# Patient Record
Sex: Female | Born: 1985 | Race: Black or African American | Hispanic: No | Marital: Married | Smoking: Current every day smoker
Health system: Southern US, Community
[De-identification: ages and names within clinical notes are randomized; demographics above are authoritative.]

## PROBLEM LIST (undated history)

## (undated) DIAGNOSIS — N289 Disorder of kidney and ureter, unspecified: Secondary | ICD-10-CM

## (undated) HISTORY — PX: TUBAL LIGATION: SHX77

## (undated) HISTORY — PX: CHOLECYSTECTOMY: SHX55

## (undated) HISTORY — PX: WISDOM TOOTH EXTRACTION: SHX21

---

## 2010-05-29 ENCOUNTER — Encounter: Payer: Self-pay | Admitting: Emergency Medicine

## 2010-05-29 ENCOUNTER — Inpatient Hospital Stay (HOSPITAL_COMMUNITY): Admission: AD | Admit: 2010-05-29 | Discharge: 2010-05-29 | Payer: Self-pay | Admitting: Obstetrics and Gynecology

## 2010-07-14 ENCOUNTER — Inpatient Hospital Stay (HOSPITAL_COMMUNITY): Admission: AD | Admit: 2010-07-14 | Discharge: 2010-07-14 | Payer: Self-pay | Admitting: Family Medicine

## 2010-07-14 ENCOUNTER — Ambulatory Visit: Payer: Self-pay | Admitting: Advanced Practice Midwife

## 2010-07-27 ENCOUNTER — Inpatient Hospital Stay (HOSPITAL_COMMUNITY): Admission: AD | Admit: 2010-07-27 | Discharge: 2010-07-30 | Payer: Self-pay | Admitting: Obstetrics and Gynecology

## 2010-07-27 ENCOUNTER — Inpatient Hospital Stay (HOSPITAL_COMMUNITY): Admission: AD | Admit: 2010-07-27 | Discharge: 2010-07-27 | Payer: Self-pay | Admitting: Obstetrics and Gynecology

## 2010-07-27 ENCOUNTER — Encounter (INDEPENDENT_AMBULATORY_CARE_PROVIDER_SITE_OTHER): Payer: Self-pay | Admitting: Obstetrics and Gynecology

## 2010-10-20 ENCOUNTER — Inpatient Hospital Stay (HOSPITAL_COMMUNITY)
Admission: AD | Admit: 2010-10-20 | Discharge: 2010-10-20 | Payer: Self-pay | Source: Home / Self Care | Attending: Obstetrics and Gynecology | Admitting: Obstetrics and Gynecology

## 2010-11-22 ENCOUNTER — Emergency Department (HOSPITAL_COMMUNITY)
Admission: EM | Admit: 2010-11-22 | Discharge: 2010-11-22 | Disposition: A | Discharge status: Home / Self Care | Payer: Self-pay | Attending: Emergency Medicine | Admitting: Emergency Medicine

## 2010-11-22 DIAGNOSIS — K089 Disorder of teeth and supporting structures, unspecified: Secondary | ICD-10-CM | POA: Insufficient documentation

## 2010-12-25 LAB — CBC
HCT: 27.9 % — ABNORMAL LOW (ref 36.0–46.0)
HCT: 32.8 % — ABNORMAL LOW (ref 36.0–46.0)
Hemoglobin: 11 g/dL — ABNORMAL LOW (ref 12.0–15.0)
MCH: 29 pg (ref 26.0–34.0)
MCHC: 33.6 g/dL (ref 30.0–36.0)
MCV: 86.4 fL (ref 78.0–100.0)
MCV: 87.7 fL (ref 78.0–100.0)
RBC: 3.8 MIL/uL — ABNORMAL LOW (ref 3.87–5.11)
WBC: 13.1 10*3/uL — ABNORMAL HIGH (ref 4.0–10.5)
WBC: 9.4 10*3/uL (ref 4.0–10.5)

## 2010-12-25 LAB — BASIC METABOLIC PANEL
CO2: 21 mEq/L (ref 19–32)
Calcium: 8.2 mg/dL — ABNORMAL LOW (ref 8.4–10.5)
Chloride: 105 mEq/L (ref 96–112)
Glucose, Bld: 75 mg/dL (ref 70–99)
Sodium: 133 mEq/L — ABNORMAL LOW (ref 135–145)

## 2010-12-25 LAB — URINALYSIS, ROUTINE W REFLEX MICROSCOPIC
Bilirubin Urine: NEGATIVE
Hgb urine dipstick: NEGATIVE
Urobilinogen, UA: 1 mg/dL (ref 0.0–1.0)
pH: 6 (ref 5.0–8.0)

## 2010-12-25 LAB — RPR: RPR Ser Ql: NONREACTIVE

## 2010-12-26 LAB — URINALYSIS, ROUTINE W REFLEX MICROSCOPIC
Glucose, UA: NEGATIVE mg/dL
Hgb urine dipstick: NEGATIVE
Nitrite: NEGATIVE
Protein, ur: NEGATIVE mg/dL
Specific Gravity, Urine: <1.005 — ABNORMAL LOW (ref 1.005–1.030)
Urobilinogen, UA: 0.2 mg/dL (ref 0.0–1.0)

## 2010-12-27 LAB — DIFFERENTIAL
Basophils Absolute: 0.2 10*3/uL — ABNORMAL HIGH (ref 0.0–0.1)
Eosinophils Relative: 0 % (ref 0–5)
Lymphocytes Relative: 29 % (ref 12–46)
Lymphs Abs: 2.1 10*3/uL (ref 0.7–4.0)
Monocytes Absolute: 0.5 10*3/uL (ref 0.1–1.0)
Monocytes Relative: 6 % (ref 3–12)
Neutrophils Relative %: 62 % (ref 43–77)

## 2010-12-27 LAB — URINALYSIS, ROUTINE W REFLEX MICROSCOPIC
Glucose, UA: NEGATIVE mg/dL
Ketones, ur: 40 mg/dL — AB
Protein, ur: NEGATIVE mg/dL
Urobilinogen, UA: 1 mg/dL (ref 0.0–1.0)
pH: 7.5 (ref 5.0–8.0)

## 2010-12-27 LAB — ABO/RH: ABO/RH(D): A POS

## 2010-12-27 LAB — URINE CULTURE
Colony Count: NO GROWTH
Culture  Setup Time: 201108180220
Culture: NO GROWTH

## 2010-12-27 LAB — CBC
MCV: 86.7 fL (ref 78.0–100.0)
WBC: 7.3 10*3/uL (ref 4.0–10.5)

## 2010-12-27 LAB — COMPREHENSIVE METABOLIC PANEL
ALT: 17 U/L (ref 0–35)
Alkaline Phosphatase: 87 U/L (ref 39–117)
CO2: 21 mEq/L (ref 19–32)
Creatinine, Ser: 0.6 mg/dL (ref 0.4–1.2)
GFR calc Af Amer: >60 mL/min (ref >60)
GFR calc non Af Amer: >60 mL/min (ref >60)
Glucose, Bld: 71 mg/dL (ref 70–99)
Potassium: 3.7 mEq/L (ref 3.5–5.1)

## 2011-01-04 ENCOUNTER — Emergency Department (HOSPITAL_COMMUNITY): Payer: Self-pay

## 2011-01-04 ENCOUNTER — Emergency Department (HOSPITAL_COMMUNITY)
Admission: EM | Admit: 2011-01-04 | Discharge: 2011-01-04 | Disposition: A | Discharge status: Home / Self Care | Payer: Self-pay | Attending: Emergency Medicine | Admitting: Emergency Medicine

## 2011-01-04 DIAGNOSIS — R1013 Epigastric pain: Secondary | ICD-10-CM | POA: Insufficient documentation

## 2011-01-04 DIAGNOSIS — N83209 Unspecified ovarian cyst, unspecified side: Secondary | ICD-10-CM | POA: Insufficient documentation

## 2011-01-04 LAB — DIFFERENTIAL
Lymphocytes Relative: 55 % — ABNORMAL HIGH (ref 12–46)
Monocytes Absolute: 0.4 10*3/uL (ref 0.1–1.0)
Neutro Abs: 2.7 10*3/uL (ref 1.7–7.7)
Neutrophils Relative %: 37 % — ABNORMAL LOW (ref 43–77)

## 2011-01-04 LAB — URINALYSIS, ROUTINE W REFLEX MICROSCOPIC
Glucose, UA: NEGATIVE mg/dL
Hgb urine dipstick: NEGATIVE
Ketones, ur: NEGATIVE mg/dL
Nitrite: NEGATIVE
Protein, ur: NEGATIVE mg/dL
Specific Gravity, Urine: 1.035 — ABNORMAL HIGH (ref 1.005–1.030)
Urobilinogen, UA: 1 mg/dL (ref 0.0–1.0)
pH: 6 (ref 5.0–8.0)

## 2011-01-04 LAB — COMPREHENSIVE METABOLIC PANEL
ALT: 18 U/L (ref 0–35)
Albumin: 3.9 g/dL (ref 3.5–5.2)
CO2: 22 mEq/L (ref 19–32)
Glucose, Bld: 83 mg/dL (ref 70–99)
Sodium: 136 mEq/L (ref 135–145)

## 2011-01-04 LAB — CBC
MCH: 28.4 pg (ref 26.0–34.0)
RBC: 3.91 MIL/uL (ref 3.87–5.11)
RDW: 14.1 % (ref 11.5–15.5)
WBC: 7.1 10*3/uL (ref 4.0–10.5)

## 2011-01-04 MED ORDER — IOHEXOL 300 MG/ML  SOLN
100.0000 mL | Freq: Once | INTRAMUSCULAR | Status: AC | PRN
  Administered 2011-01-04: 100 mL via INTRAVENOUS

## 2011-01-07 LAB — GC/CHLAMYDIA PROBE AMP, GENITAL: GC Probe Amp, Genital: NEGATIVE

## 2011-01-12 ENCOUNTER — Emergency Department (HOSPITAL_COMMUNITY): Payer: Self-pay

## 2011-01-12 ENCOUNTER — Emergency Department (HOSPITAL_COMMUNITY)
Admission: EM | Admit: 2011-01-12 | Discharge: 2011-01-12 | Disposition: A | Discharge status: Home / Self Care | Payer: Self-pay | Attending: Emergency Medicine | Admitting: Emergency Medicine

## 2011-01-12 DIAGNOSIS — R197 Diarrhea, unspecified: Secondary | ICD-10-CM | POA: Insufficient documentation

## 2011-01-12 DIAGNOSIS — R112 Nausea with vomiting, unspecified: Secondary | ICD-10-CM | POA: Insufficient documentation

## 2011-01-12 DIAGNOSIS — R109 Unspecified abdominal pain: Secondary | ICD-10-CM | POA: Insufficient documentation

## 2011-01-12 LAB — COMPREHENSIVE METABOLIC PANEL
Albumin: 4 g/dL (ref 3.5–5.2)
Alkaline Phosphatase: 42 U/L (ref 39–117)
CO2: 21 mEq/L (ref 19–32)
Calcium: 8.6 mg/dL (ref 8.4–10.5)
Chloride: 109 mEq/L (ref 96–112)
GFR calc Af Amer: >60 mL/min (ref >60)
Glucose, Bld: 94 mg/dL (ref 70–99)
Potassium: 3.3 mEq/L — ABNORMAL LOW (ref 3.5–5.1)
Total Protein: 6.7 g/dL (ref 6.0–8.3)

## 2011-01-12 LAB — DIFFERENTIAL
Basophils Relative: 0 % (ref 0–1)
Eosinophils Absolute: 0.2 10*3/uL (ref 0.0–0.7)
Eosinophils Relative: 3 % (ref 0–5)
Lymphocytes Relative: 57 % — ABNORMAL HIGH (ref 12–46)
Lymphs Abs: 2.9 10*3/uL (ref 0.7–4.0)
Monocytes Absolute: 0.3 10*3/uL (ref 0.1–1.0)
Neutro Abs: 1.8 10*3/uL (ref 1.7–7.7)
Neutrophils Relative %: 34 % — ABNORMAL LOW (ref 43–77)

## 2011-01-12 LAB — CBC
MCH: 28 pg (ref 26.0–34.0)
MCHC: 33.6 g/dL (ref 30.0–36.0)
MCV: 83.4 fL (ref 78.0–100.0)
Platelets: 329 10*3/uL (ref 150–400)
WBC: 5.2 10*3/uL (ref 4.0–10.5)

## 2011-01-12 LAB — URINALYSIS, ROUTINE W REFLEX MICROSCOPIC
Bilirubin Urine: NEGATIVE
Glucose, UA: NEGATIVE mg/dL
Hgb urine dipstick: NEGATIVE
Ketones, ur: NEGATIVE mg/dL
Protein, ur: NEGATIVE mg/dL
pH: 6 (ref 5.0–8.0)

## 2011-01-12 LAB — LIPASE, BLOOD: Lipase: 20 U/L (ref 11–59)

## 2011-10-29 ENCOUNTER — Encounter (HOSPITAL_COMMUNITY): Payer: Self-pay

## 2011-10-29 ENCOUNTER — Emergency Department (HOSPITAL_COMMUNITY)
Admission: EM | Admit: 2011-10-29 | Discharge: 2011-10-29 | Disposition: A | Discharge status: Home / Self Care | Payer: Self-pay | Attending: Emergency Medicine | Admitting: Emergency Medicine

## 2011-10-29 ENCOUNTER — Emergency Department (HOSPITAL_COMMUNITY): Payer: Self-pay

## 2011-10-29 DIAGNOSIS — W010XXA Fall on same level from slipping, tripping and stumbling without subsequent striking against object, initial encounter: Secondary | ICD-10-CM | POA: Insufficient documentation

## 2011-10-29 DIAGNOSIS — S63509A Unspecified sprain of unspecified wrist, initial encounter: Secondary | ICD-10-CM | POA: Insufficient documentation

## 2011-10-29 DIAGNOSIS — S63501A Unspecified sprain of right wrist, initial encounter: Secondary | ICD-10-CM

## 2011-10-29 DIAGNOSIS — M25539 Pain in unspecified wrist: Secondary | ICD-10-CM | POA: Insufficient documentation

## 2011-10-29 MED ORDER — IBUPROFEN 600 MG PO TABS: 600.0000 mg | ORAL_TABLET | Freq: Four times a day (QID) | ORAL | Status: AC | PRN

## 2011-10-29 MED ORDER — OXYCODONE-ACETAMINOPHEN 5-325 MG PO TABS
1.0000 | ORAL_TABLET | Freq: Once | ORAL | Status: AC
  Administered 2011-10-29: 1 via ORAL
  Filled 2011-10-29: qty 1

## 2011-10-29 NOTE — ED Notes (Signed)
Pt states slipped and fell in the rain yesterday, c/o rt wrist/forearm pain with swelling

## 2011-10-29 NOTE — ED Notes (Signed)
Patient verbalized understanding of discharge instructions and prescription 

## 2011-10-29 NOTE — ED Notes (Signed)
Patient states walking in rain with heels and fell forward from standing position and caught herself with hands last night.

## 2011-10-29 NOTE — ED Provider Notes (Signed)
History     CSN: [id number]  Arrival date & time 10/29/11  1006   First MD Initiated Contact with Patient 10/29/11 1020      Chief Complaint  Patient presents with  . Wrist Pain  . Fall    (Consider location/radiation/quality/duration/timing/severity/associated sxs/prior treatment) HPI 26 year old female presenting to the ED of right wrist pain. Patient states she was walking down the rain on high heels yesterday when she lost balance, fell forward and extending her right hand to break her fall. She denies hitting head or loss of consciousness. She noticed pain to her right wrist radiating to the forearm. She denies elbow pain or shoulder pain. She denies bleeding or numbness.  History reviewed. No pertinent past medical history.  Past Surgical History  Procedure Date  . Cholecystectomy   . Cesarean section   . Tubal ligation     No family history on file.  History  Substance Use Topics  . Smoking status: Current Everyday Smoker -- 0.5 packs/day  . Smokeless tobacco: Never Used  . Alcohol Use: No    OB History    Grav Para Term Preterm Abortions TAB SAB Ect Mult Living                  Review of Systems  Allergies  Review of patient's allergies indicates no known allergies.  Home Medications  No current outpatient prescriptions on file.  BP 133/64  Pulse 108  Temp(Src) 97.9 F (36.6 C) (Oral)  Resp 17  SpO2 100%  LMP 10/19/2011  Physical Exam  ED Course  Procedures (including critical care time)  Labs Reviewed - No data to display No results found.   No diagnosis found.    MDM  Xray of R wrist and R forearm shows no acute fractures or disclocation.  Reassurance given, ACE wrap apply, care instruction given.          Fayrene Helper, PA-C 10/29/11 1122

## 2011-10-30 NOTE — ED Provider Notes (Signed)
Medical screening examination/treatment/procedure(s) were conducted as a shared visit with non-physician practitioner(s) and myself.  I personally evaluated the patient during the encounter  Pt c/o right wrist pain s/p fall. Skin intact. No focal scaphoid tenderness. Radial pusle 2+. Berlin Hun, MD 10/30/11 [phone]

## 2011-12-22 ENCOUNTER — Encounter (HOSPITAL_COMMUNITY): Payer: Self-pay

## 2011-12-22 ENCOUNTER — Emergency Department (HOSPITAL_COMMUNITY)
Admission: EM | Admit: 2011-12-22 | Discharge: 2011-12-22 | Disposition: A | Discharge status: Home / Self Care | Payer: Self-pay | Attending: Emergency Medicine | Admitting: Emergency Medicine

## 2011-12-22 DIAGNOSIS — B354 Tinea corporis: Secondary | ICD-10-CM | POA: Insufficient documentation

## 2011-12-22 DIAGNOSIS — F17200 Nicotine dependence, unspecified, uncomplicated: Secondary | ICD-10-CM | POA: Insufficient documentation

## 2011-12-22 MED ORDER — CLOTRIMAZOLE 1 % EX CREA: TOPICAL_CREAM | CUTANEOUS | Status: DC

## 2011-12-22 NOTE — ED Provider Notes (Signed)
History     CSN: [id number]  Arrival date & time 12/22/11  1018   First MD Initiated Contact with Patient 12/22/11 1155      Chief Complaint  Patient presents with  . Rash    (Consider location/radiation/quality/duration/timing/severity/associated sxs/prior treatment) Patient is a 26 y.o. female presenting with rash. The history is provided by the patient.  Rash  This is a new problem.   patient has had a rash since Friday. It is itchy. She's patches on her abdomen and arms. She thinks maybe ringworm. 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 No fevers. No drainage.  History reviewed. No pertinent past medical history.  Past Surgical History  Procedure Date  . Cholecystectomy   . Cesarean section   . Tubal ligation     No family history on file.  History  Substance Use Topics  . Smoking status: Current Everyday Smoker -- 0.5 packs/day  . Smokeless tobacco: Never Used  . Alcohol Use: No    OB History    Grav Para Term Preterm Abortions TAB SAB Ect Mult Living                  Review of Systems  Constitutional: Negative for appetite change and fatigue.  Respiratory: Negative for cough.   Skin: Positive for rash. Negative for pallor.  Psychiatric/Behavioral: Negative for self-injury. The patient is not hyperactive.     Allergies  Review of patient's allergies indicates no known allergies.  Home Medications   Current Outpatient Rx  Name Route Sig Dispense Refill  . CLOTRIMAZOLE 1 % EX CREA  Apply to affected area 2 times daily 15 g 0    BP 118/69  Pulse 76  Temp(Src) 98.4 F (36.9 C) (Oral)  Resp 17  Ht 5\' 5"  (1.651 m)  Wt 160 lb (72.576 kg)  BMI 26.63 kg/m2  SpO2 98%  LMP 12/22/2011  Physical Exam  Constitutional: She appears well-developed.  Skin: Rash noted.       Patient has a few approximately 1 cm round patches on her abdomen and upper extremities. There is some mild redness and scaliness to them. No drainage. No blistering.     ED Course  Procedures (including critical care time)  Labs Reviewed - No data to display No results found.   1. Tinea corporis       MDM  Rash or body. Likely ringworm. She be discharged home with antifungal's        Harrold Donath R. Rubin Payor, MD 12/22/11 2015

## 2011-12-22 NOTE — Discharge Instructions (Signed)
Ringworm, Body [Tinea Corporis]  Ringworm is a fungal infection of the skin and hair. Another name for this problem is Tinea Corporis. It has nothing to do with worms. A fungus is an organism that lives on dead cells (the outer layer of skin). It can involve the entire body. It can spread from infected pets. Tinea corporis can be a problem in wrestlers who may get the infection form other players/opponents, equipment and mats.  DIAGNOSIS   A skin scraping can be obtained from the affected area and by looking for fungus under the microscope. This is called a KOH examination.   HOME CARE INSTRUCTIONS    Ringworm may be treated with a topical antifungal cream, ointment, or oral medications.   If you are using a cream or ointment, wash infected skin. Dry it completely before application.   Scrub the skin with a buff puff or abrasive sponge using a shampoo with ketoconazole to remove dead skin and help treat the ringworm.   Have your pet treated by your veterinarian if it has the same infection.  SEEK MEDICAL CARE IF:    Your ringworm patch (fungus) continues to spread after 7 days of treatment.   Your rash is not gone in 4 weeks. Fungal infections are slow to respond to treatment. Some redness (erythema) may remain for several weeks after the fungus is gone.   The area becomes red, warm, tender, and swollen beyond the patch. This may be a secondary bacterial (germ) infection.   You have a fever.  Document Released: 09/26/2000 Document Revised: 09/18/2011 Document Reviewed: 03/09/2009  ExitCare Patient Information 2012 [employer].

## 2011-12-22 NOTE — ED Notes (Signed)
Pt, began developing hives on Friday, resembles Ringworm, very itchy, she denies changing  Any of her habits,  She does work with [employer]

## 2012-01-31 ENCOUNTER — Encounter (HOSPITAL_COMMUNITY): Payer: Self-pay | Admitting: *Deleted

## 2012-01-31 ENCOUNTER — Emergency Department (HOSPITAL_COMMUNITY)
Admission: EM | Admit: 2012-01-31 | Discharge: 2012-01-31 | Disposition: A | Discharge status: Home / Self Care | Payer: Self-pay | Attending: Emergency Medicine | Admitting: Emergency Medicine

## 2012-01-31 ENCOUNTER — Emergency Department (HOSPITAL_COMMUNITY): Payer: Self-pay

## 2012-01-31 DIAGNOSIS — N83209 Unspecified ovarian cyst, unspecified side: Secondary | ICD-10-CM | POA: Insufficient documentation

## 2012-01-31 DIAGNOSIS — R630 Anorexia: Secondary | ICD-10-CM | POA: Insufficient documentation

## 2012-01-31 DIAGNOSIS — N83202 Unspecified ovarian cyst, left side: Secondary | ICD-10-CM

## 2012-01-31 DIAGNOSIS — R112 Nausea with vomiting, unspecified: Secondary | ICD-10-CM | POA: Insufficient documentation

## 2012-01-31 DIAGNOSIS — R109 Unspecified abdominal pain: Secondary | ICD-10-CM | POA: Insufficient documentation

## 2012-01-31 LAB — URINALYSIS, ROUTINE W REFLEX MICROSCOPIC
Bilirubin Urine: NEGATIVE
Glucose, UA: NEGATIVE mg/dL
Hgb urine dipstick: NEGATIVE
Ketones, ur: NEGATIVE mg/dL
Nitrite: NEGATIVE
Specific Gravity, Urine: 1.025 (ref 1.005–1.030)
pH: 6.5 (ref 5.0–8.0)

## 2012-01-31 LAB — WET PREP, GENITAL
Trich, Wet Prep: NONE SEEN
Yeast Wet Prep HPF POC: NONE SEEN

## 2012-01-31 LAB — URINE MICROSCOPIC-ADD ON

## 2012-01-31 MED ORDER — OXYCODONE-ACETAMINOPHEN 5-325 MG PO TABS
1.0000 | ORAL_TABLET | Freq: Once | ORAL | Status: AC
  Administered 2012-01-31: 1 via ORAL
  Filled 2012-01-31: qty 1

## 2012-01-31 MED ORDER — ONDANSETRON 4 MG PO TBDP
4.0000 mg | ORAL_TABLET | Freq: Once | ORAL | Status: AC
  Administered 2012-01-31: 4 mg via ORAL
  Filled 2012-01-31: qty 1

## 2012-01-31 MED ORDER — KETOROLAC TROMETHAMINE 60 MG/2ML IM SOLN
60.0000 mg | Freq: Once | INTRAMUSCULAR | Status: AC
  Administered 2012-01-31: 60 mg via INTRAMUSCULAR
  Filled 2012-01-31: qty 2

## 2012-01-31 MED ORDER — ONDANSETRON 4 MG PO TBDP: 4.0000 mg | ORAL_TABLET | Freq: Once | ORAL | Status: DC

## 2012-01-31 MED ORDER — OXYCODONE-ACETAMINOPHEN 5-325 MG PO TABS: 1.0000 | ORAL_TABLET | Freq: Once | ORAL | Status: DC

## 2012-01-31 NOTE — ED Provider Notes (Signed)
History     CSN: [id number]  Arrival date & time 01/31/12  1525   First MD Initiated Contact with Patient 01/31/12 1829      Chief Complaint  Patient presents with  . Abdominal Pain    (Consider location/radiation/quality/duration/timing/severity/associated sxs/prior treatment) Patient is a 26 y.o. female presenting with abdominal pain. The history is provided by the patient.  Abdominal Pain The primary symptoms of the illness include abdominal pain, nausea and vomiting. The primary symptoms of the illness do not include fever, diarrhea, dysuria, vaginal discharge or vaginal bleeding. Episode onset: 3 days ago. The onset of the illness was sudden. The problem has not changed since onset. The pain came on suddenly. The abdominal pain has been gradually worsening since its onset. Pain Location: Left pelvic region. The abdominal pain does not radiate. The severity of the abdominal pain is 9/10. The abdominal pain is relieved by nothing. The abdominal pain is exacerbated by movement and certain positions.  The patient states that she believes she is currently not pregnant. The patient has not had a change in bowel habit. Additional symptoms associated with the illness include anorexia. Symptoms associated with the illness do not include chills, urgency, frequency or back pain. Associated medical issues comments: History of ovarian cysts.    History reviewed. No pertinent past medical history.  Past Surgical History  Procedure Date  . Cholecystectomy   . Cesarean section   . Tubal ligation     History reviewed. No pertinent family history.  History  Substance Use Topics  . Smoking status: Current Everyday Smoker -- 0.5 packs/day  . Smokeless tobacco: Never Used  . Alcohol Use: No    OB History    Grav Para Term Preterm Abortions TAB SAB Ect Mult Living                  Review of Systems  Constitutional: Negative for fever and chills.  Gastrointestinal: Positive for nausea,  vomiting, abdominal pain and anorexia. Negative for diarrhea.  Genitourinary: Negative for dysuria, urgency, frequency, vaginal bleeding and vaginal discharge.  Musculoskeletal: Negative for back pain.  All other systems reviewed and are negative.    Allergies  Review of patient's allergies indicates no known allergies.  Home Medications  No current outpatient prescriptions on file.  BP 120/67  Pulse 85  Temp(Src) 98.3 F (36.8 C) (Oral)  Resp 16  SpO2 99%  Physical Exam  Nursing note and vitals reviewed. Constitutional: She is oriented to person, place, and time. She appears well-developed and well-nourished. She appears distressed.  HENT:  Head: Normocephalic and atraumatic.  Mouth/Throat: Oropharynx is clear and moist.  Eyes: Conjunctivae and EOM are normal. Pupils are equal, round, and reactive to light.  Neck: Normal range of motion. Neck supple.  Cardiovascular: Normal rate, regular rhythm and intact distal pulses.   No murmur heard. Pulmonary/Chest: Effort normal and breath sounds normal. No respiratory distress. She has no wheezes. She has no rales.  Abdominal: Soft. She exhibits no distension. There is tenderness. There is no rebound, no guarding and no CVA tenderness.    Genitourinary: Vagina normal and uterus normal. Cervix exhibits no motion tenderness, no discharge and no friability. Right adnexum displays no tenderness. Left adnexum displays tenderness. Left adnexum displays no mass and no fullness. No vaginal discharge found.  Musculoskeletal: Normal range of motion. She exhibits no edema and no tenderness.  Neurological: She is alert and oriented to person, place, and time.  Skin: Skin is warm and dry.  No rash noted. No erythema.  Psychiatric: She has a normal mood and affect. Her behavior is normal.    ED Course  Procedures (including critical care time)  Labs Reviewed  URINALYSIS, ROUTINE W REFLEX MICROSCOPIC - Abnormal; Notable for the following:     Leukocytes, UA SMALL (*)    All other components within normal limits  URINE MICROSCOPIC-ADD ON - Abnormal; Notable for the following:    Squamous Epithelial / LPF FEW (*)    Bacteria, UA FEW (*)    All other components within normal limits  POCT PREGNANCY, URINE  WET PREP, GENITAL  GC/CHLAMYDIA PROBE AMP, GENITAL   No results found.   No diagnosis found.    MDM   Patient with symptoms most consistent with left ovarian cyst. She's had pain for the last 3 days that is severe and in her left pelvic region. She denies any dysuria, vaginal discharge or bleeding. No flank pain suggestive of a kidney stone and she denies any symptoms suggestive of diverticulitis. On exam she has moderate amount of pain in the left ovarian area. UA is a clean catch a mildly contaminated but no signs of blood consistent with kidney stone and only 3-6 white blood cells and feel that it is contaminant. Ultrasound pending to evaluate for ovarian cyst. UPT negative.        Gwyneth Sprout, MD 01/31/12 [phone]

## 2012-01-31 NOTE — Discharge Instructions (Signed)
Ovarian Cyst The ovaries are small organs that are on each side of the uterus. The ovaries are the organs that produce the female hormones, estrogen and progesterone. An ovarian cyst is a sac filled with fluid that can vary in its size. It is normal for a small cyst to form in women who are in the childbearing age and who have menstrual periods. This type of cyst is called a follicle cyst that becomes an ovulation cyst (corpus luteum cyst) after it produces the women's egg. It later goes away on its own if the woman does not become pregnant. There are other kinds of ovarian cysts that may cause problems and may need to be treated. The most serious problem is a cyst with cancer. It should be noted that menopausal women who have an ovarian cyst are at a higher risk of it being a cancer cyst. They should be evaluated very quickly, thoroughly and followed closely. This is especially true in menopausal women because of the high rate of ovarian cancer in women in menopause. CAUSES AND TYPES OF OVARIAN CYSTS:  FUNCTIONAL CYST: The follicle/corpus luteum cyst is a functional cyst that occurs every month during ovulation with the menstrual cycle. They go away with the next menstrual cycle if the woman does not get pregnant. Usually, there are no symptoms with a functional cyst.   ENDOMETRIOMA CYST: This cyst develops from the lining of the uterus tissue. This cyst gets in or on the ovary. It grows every month from the bleeding during the menstrual period. It is also called a "chocolate cyst" because it becomes filled with blood that turns brown. This cyst can cause pain in the lower abdomen during intercourse and with your menstrual period.   CYSTADENOMA CYST: This cyst develops from the cells on the outside of the ovary. They usually are not cancerous. They can get very big and cause lower abdomen pain and pain with intercourse. This type of cyst can twist on itself, cut off its blood supply and cause severe pain.  It also can easily rupture and cause a lot of pain.   DERMOID CYST: This type of cyst is sometimes found in both ovaries. They are found to have different kinds of body tissue in the cyst. The tissue includes skin, teeth, hair, and/or cartilage. They usually do not have symptoms unless they get very big. Dermoid cysts are rarely cancerous.   POLYCYSTIC OVARY: This is a rare condition with hormone problems that produces many small cysts on both ovaries. The cysts are follicle-like cysts that never produce an egg and become a corpus luteum. It can cause an increase in body weight, infertility, acne, increase in body and facial hair and lack of menstrual periods or rare menstrual periods. Many women with this problem develop type 2 diabetes. The exact cause of this problem is unknown. A polycystic ovary is rarely cancerous.   THECA LUTEIN CYST: Occurs when too much hormone (human chorionic gonadotropin) is produced and over-stimulates the ovaries to produce an egg. They are frequently seen when doctors stimulate the ovaries for invitro-fertilization (test tube babies).   LUTEOMA CYST: This cyst is seen during pregnancy. Rarely it can cause an obstruction to the birth canal during labor and delivery. They usually go away after delivery.  SYMPTOMS   Pelvic pain or pressure.   Pain during sexual intercourse.   Increasing girth (swelling) of the abdomen.   Abnormal menstrual periods.   Increasing pain with menstrual periods.   You stop having   menstrual periods and you are not pregnant.  DIAGNOSIS  The diagnosis can be made during:  Routine or annual pelvic examination (common).   Ultrasound.   X-ray of the pelvis.   CT Scan.   MRI.   Blood tests.  TREATMENT   Treatment may only be to follow the cyst monthly for 2 to 3 months with your caregiver. Many go away on their own, especially functional cysts.   May be aspirated (drained) with a long needle with ultrasound, or by laparoscopy  (inserting a tube into the pelvis through a small incision).   The whole cyst can be removed by laparoscopy.   Sometimes the cyst may need to be removed through an incision in the lower abdomen.   Hormone treatment is sometimes used to help dissolve certain cysts.   Birth control pills are sometimes used to help dissolve certain cysts.  HOME CARE INSTRUCTIONS  Follow your caregiver's advice regarding:  Medicine.   Follow up visits to evaluate and treat the cyst.   You may need to come back or make an appointment with another caregiver, to find the exact cause of your cyst, if your caregiver is not a gynecologist.   Get your yearly and recommended pelvic examinations and Pap tests.   Let your caregiver know if you have had an ovarian cyst in the past.  SEEK MEDICAL CARE IF:   Your periods are late, irregular, they stop, or are painful.   Your stomach (abdomen) or pelvic pain does not go away.   Your stomach becomes larger or swollen.   You have pressure on your bladder or trouble emptying your bladder completely.   You have painful sexual intercourse.   You have feelings of fullness, pressure, or discomfort in your stomach.   You lose weight for no apparent reason.   You feel generally ill.   You become constipated.   You lose your appetite.   You develop acne.   You have an increase in body and facial hair.   You are gaining weight, without changing your exercise and eating habits.   You think you are pregnant.  SEEK IMMEDIATE MEDICAL CARE IF:   You have increasing abdominal pain.   You feel sick to your stomach (nausea) and/or vomit.   You develop a fever that comes on suddenly.   You develop abdominal pain during a bowel movement.   Your menstrual periods become heavier than usual.  Document Released: 09/29/2005 Document Revised: 09/18/2011 Document Reviewed: 08/02/2009 Exit[employer] Patient Information 2012 [employer]. 

## 2012-01-31 NOTE — ED Notes (Signed)
PELVIC EXAM BY DR. Anitra Lauth WITH [facility].

## 2012-01-31 NOTE — ED Provider Notes (Signed)
History     CSN: [id number]  Arrival date & time 01/31/12  1525   First MD Initiated Contact with Patient 01/31/12 1829      Chief Complaint  Patient presents with  . Abdominal Pain    (Consider location/radiation/quality/duration/timing/severity/associated sxs/prior treatment) HPI  History reviewed. No pertinent past medical history.  Past Surgical History  Procedure Date  . Cholecystectomy   . Cesarean section   . Tubal ligation     History reviewed. No pertinent family history.  History  Substance Use Topics  . Smoking status: Current Everyday Smoker -- 0.5 packs/day  . Smokeless tobacco: Never Used  . Alcohol Use: No    OB History    Grav Para Term Preterm Abortions TAB SAB Ect Mult Living                  Review of Systems  Allergies  Review of patient's allergies indicates no known allergies.  Home Medications  No current outpatient prescriptions on file.  BP 120/67  Pulse 85  Temp(Src) 98.3 F (36.8 C) (Oral)  Resp 16  SpO2 99%  Physical Exam  ED Course  Procedures (including critical care time)  Labs Reviewed  URINALYSIS, ROUTINE W REFLEX MICROSCOPIC - Abnormal; Notable for the following:    Leukocytes, UA SMALL (*)    All other components within normal limits  URINE MICROSCOPIC-ADD ON - Abnormal; Notable for the following:    Squamous Epithelial / LPF FEW (*)    Bacteria, UA FEW (*)    All other components within normal limits  WET PREP, GENITAL - Abnormal; Notable for the following:    Clue Cells Wet Prep HPF POC FEW (*)    All other components within normal limits  POCT PREGNANCY, URINE  GC/CHLAMYDIA PROBE AMP, GENITAL   US Transvaginal Non-ob  01/31/2012  *RADIOLOGY REPORT*  Clinical Data: Left lower quadrant pain for 3 days. History of prior C-section and tubal ligation.  TRANSABDOMINAL AND TRANSVAGINAL ULTRASOUND OF PELVIS Technique:  Both transabdominal and transvaginal ultrasound examinations of the pelvis were performed.  Transabdominal technique was performed for global imaging of the pelvis including uterus, ovaries, adnexal regions, and pelvic cul-de-sac.  Comparison: CT 01/04/2011   It was necessary to proceed with endovaginal exam following the transabdominal exam to visualize the cervix and ovaries.  Findings:  Uterus: The uterus measures 11.2 x 4.5 x 5.1 cm. The uterus is retroflexed. At the juncture of the body and lower uterine segment anteriorly, there is a focal hypoechoic region in the uterine myometrium.  This is in the expected area of scar from C-section and likely represents scarring.  The myometrium is otherwise homogeneous.  No focal masses are demonstrated.  Endometrium: Endometrial stripe thickness is upper limits of normal, measuring about 11 mm double wall.  Minimal fluid is demonstrated in the cervical canal region.  Right ovary:  The right ovary measures 4.8 x 2.88-0.8 cm.  Normal follicular changes are demonstrated with a dominant follicle measuring about 1.7 cm diameter.  No abnormal adnexal masses.  Left ovary: The left ovary measures 4 x 2.6 x 3.1 cm.  Normal follicular changes are demonstrated.  Small hemorrhagic follicle measuring about 1 cm diameter.  Color flow Doppler images demonstrate flow within both ovaries.  Other findings: Small amount of free fluid in the pelvis.  IMPRESSION: Hypoechoic focus within the anterior wall of the lower uterus most likely to represent scar from C-section. Minimal fluid demonstrated in the cervical canal.  Endometrium is otherwise  unremarkable. Normal follicular changes in the ovaries.  Small amount of free fluid in the pelvis is likely physiologic.  Original Report Authenticated By: Marlon Pel, M.D.   US Pelvis Complete  01/31/2012  *RADIOLOGY REPORT*  Clinical Data: Left lower quadrant pain for 3 days. History of prior C-section and tubal ligation.  TRANSABDOMINAL AND TRANSVAGINAL ULTRASOUND OF PELVIS Technique:  Both transabdominal and transvaginal  ultrasound examinations of the pelvis were performed. Transabdominal technique was performed for global imaging of the pelvis including uterus, ovaries, adnexal regions, and pelvic cul-de-sac.  Comparison: CT 01/04/2011   It was necessary to proceed with endovaginal exam following the transabdominal exam to visualize the cervix and ovaries.  Findings:  Uterus: The uterus measures 11.2 x 4.5 x 5.1 cm. The uterus is retroflexed. At the juncture of the body and lower uterine segment anteriorly, there is a focal hypoechoic region in the uterine myometrium.  This is in the expected area of scar from C-section and likely represents scarring.  The myometrium is otherwise homogeneous.  No focal masses are demonstrated.  Endometrium: Endometrial stripe thickness is upper limits of normal, measuring about 11 mm double wall.  Minimal fluid is demonstrated in the cervical canal region.  Right ovary:  The right ovary measures 4.8 x 2.88-0.8 cm.  Normal follicular changes are demonstrated with a dominant follicle measuring about 1.7 cm diameter.  No abnormal adnexal masses.  Left ovary: The left ovary measures 4 x 2.6 x 3.1 cm.  Normal follicular changes are demonstrated.  Small hemorrhagic follicle measuring about 1 cm diameter.  Color flow Doppler images demonstrate flow within both ovaries.  Other findings: Small amount of free fluid in the pelvis.  IMPRESSION: Hypoechoic focus within the anterior wall of the lower uterus most likely to represent scar from C-section. Minimal fluid demonstrated in the cervical canal.  Endometrium is otherwise unremarkable. Normal follicular changes in the ovaries.  Small amount of free fluid in the pelvis is likely physiologic.  Original Report Authenticated By: Marlon Pel, M.D.     Left ovarian cyst    MDM  I have taken over care of this patient from Dr. Anitra Lauth, please see her note for HPI, ROS and PE - [place] basically benign but with small hemorrhagic left ovarian cyst which  is the likely cause of the patient's left sided lower pelvic pain.  Will give the patient a short course of pain medication and nausea medication and she may follow up with her GYN to make sure resolution of the cyst.  No evidence of rupture, abscess, PID        Scarlette Calico C. [place], [place] 01/31/12 2219

## 2012-01-31 NOTE — ED Notes (Signed)
To ED for eval of llq pain for the past 3 days. Denies abnormal vaginal discharge, but c/o frequent urination. Skin w/d, resp e/u. Appears in pain, tearful.

## 2012-02-01 NOTE — ED Provider Notes (Signed)
Medical screening examination/treatment/procedure(s) were conducted as a shared visit with non-physician practitioner(s) and myself.  I personally evaluated the patient during the encounter   Gwyneth Sprout, MD 02/01/12 [phone]

## 2012-02-02 ENCOUNTER — Emergency Department (HOSPITAL_COMMUNITY)
Admission: EM | Admit: 2012-02-02 | Discharge: 2012-02-02 | Disposition: A | Discharge status: Home / Self Care | Payer: Self-pay | Attending: Emergency Medicine | Admitting: Emergency Medicine

## 2012-02-02 ENCOUNTER — Encounter (HOSPITAL_COMMUNITY): Payer: Self-pay | Admitting: *Deleted

## 2012-02-02 DIAGNOSIS — Z760 Encounter for issue of repeat prescription: Secondary | ICD-10-CM

## 2012-02-02 DIAGNOSIS — Z9089 Acquired absence of other organs: Secondary | ICD-10-CM | POA: Insufficient documentation

## 2012-02-02 DIAGNOSIS — F17200 Nicotine dependence, unspecified, uncomplicated: Secondary | ICD-10-CM | POA: Insufficient documentation

## 2012-02-02 DIAGNOSIS — R1032 Left lower quadrant pain: Secondary | ICD-10-CM | POA: Insufficient documentation

## 2012-02-02 LAB — GC/CHLAMYDIA PROBE AMP, GENITAL
Chlamydia, DNA Probe: NEGATIVE
GC Probe Amp, Genital: POSITIVE — AB

## 2012-02-02 MED ORDER — PROMETHAZINE HCL 25 MG PO TABS: 25.0000 mg | ORAL_TABLET | Freq: Four times a day (QID) | ORAL | Status: DC | PRN

## 2012-02-02 MED ORDER — OXYCODONE-ACETAMINOPHEN 5-325 MG PO TABS
1.0000 | ORAL_TABLET | Freq: Once | ORAL | Status: AC
  Administered 2012-02-02: 1 via ORAL
  Filled 2012-02-02: qty 1

## 2012-02-02 MED ORDER — OXYCODONE-ACETAMINOPHEN 5-325 MG PO TABS: 1.0000 | ORAL_TABLET | ORAL | Status: AC | PRN

## 2012-02-02 NOTE — ED Notes (Signed)
Clarification on percocet- take one tablet every 4 to 6 hours as needed for pain.

## 2012-02-02 NOTE — ED Provider Notes (Signed)
History     CSN: [id number]  Arrival date & time 02/02/12  1425   First MD Initiated Contact with Patient 02/02/12 1552      Chief Complaint  Patient presents with  . Abdominal Pain    (Consider location/radiation/quality/duration/timing/severity/associated sxs/prior treatment) The history is provided by the patient.   patient reports she was seen in the ER 2 days ago for left lower quadrant abdominal pain and was diagnosed with a ruptured ovarian cyst.  She reports that she went to get her pain medication filled today that was filled but then her husband left and on the bus.  She has no pain medications and requests a refill of her pain medications.  She also reports she was prescribed Zofran but she has no insurance and was unable to afford it.  She requests a cheaper medication.  She reports the pain in her left lower quadrant is constant it is unchanged from before.  She reports her pain is not worse.  She would not have come back to the emergency department today for evaluation had she had her pain medication.  History reviewed. No pertinent past medical history.  Past Surgical History  Procedure Date  . Cholecystectomy   . Cesarean section   . Tubal ligation     History reviewed. No pertinent family history.  History  Substance Use Topics  . Smoking status: Current Everyday Smoker -- 0.5 packs/day  . Smokeless tobacco: Never Used  . Alcohol Use: No    OB History    Grav Para Term Preterm Abortions TAB SAB Ect Mult Living                  Review of Systems  Gastrointestinal: Positive for abdominal pain.  All other systems reviewed and are negative.    Allergies  Review of patient's allergies indicates no known allergies.  Home Medications   Current Outpatient Rx  Name Route Sig Dispense Refill  . OXYCODONE-ACETAMINOPHEN 5-325 MG PO TABS Oral Take 1 tablet by mouth every 4 (four) hours as needed for pain. 15 tablet 0  . PROMETHAZINE HCL 25 MG PO TABS Oral  Take 1 tablet (25 mg total) by mouth every 6 (six) hours as needed for nausea. 12 tablet 0    BP 111/66  Pulse 82  Temp(Src) 98.4 F (36.9 C) (Oral)  Resp 16  SpO2 98%  Physical Exam  Nursing note and vitals reviewed. Constitutional: She is oriented to person, place, and time. She appears well-developed and well-nourished. No distress.       Vital signs are normal  HENT:  Head: Normocephalic and atraumatic.  Eyes: EOM are normal.  Neck: Normal range of motion.  Cardiovascular: Normal rate, regular rhythm and normal heart sounds.   Pulmonary/Chest: Effort normal and breath sounds normal.  Abdominal: Soft. She exhibits no distension. There is no tenderness. There is no rebound and no guarding.  Musculoskeletal: Normal range of motion.  Neurological: She is alert and oriented to person, place, and time.  Skin: Skin is warm and dry.  Psychiatric: She has a normal mood and affect. Judgment normal.    ED Course  Procedures (including critical care time)  Labs Reviewed - No data to display US Transvaginal Non-ob  01/31/2012  *RADIOLOGY REPORT*  Clinical Data: Left lower quadrant pain for 3 days. History of prior C-section and tubal ligation.  TRANSABDOMINAL AND TRANSVAGINAL ULTRASOUND OF PELVIS Technique:  Both transabdominal and transvaginal ultrasound examinations of the pelvis were performed. Transabdominal technique  was performed for global imaging of the pelvis including uterus, ovaries, adnexal regions, and pelvic cul-de-sac.  Comparison: CT 01/04/2011   It was necessary to proceed with endovaginal exam following the transabdominal exam to visualize the cervix and ovaries.  Findings:  Uterus: The uterus measures 11.2 x 4.5 x 5.1 cm. The uterus is retroflexed. At the juncture of the body and lower uterine segment anteriorly, there is a focal hypoechoic region in the uterine myometrium.  This is in the expected area of scar from C-section and likely represents scarring.  The myometrium  is otherwise homogeneous.  No focal masses are demonstrated.  Endometrium: Endometrial stripe thickness is upper limits of normal, measuring about 11 mm double wall.  Minimal fluid is demonstrated in the cervical canal region.  Right ovary:  The right ovary measures 4.8 x 2.88-0.8 cm.  Normal follicular changes are demonstrated with a dominant follicle measuring about 1.7 cm diameter.  No abnormal adnexal masses.  Left ovary: The left ovary measures 4 x 2.6 x 3.1 cm.  Normal follicular changes are demonstrated.  Small hemorrhagic follicle measuring about 1 cm diameter.  Color flow Doppler images demonstrate flow within both ovaries.  Other findings: Small amount of free fluid in the pelvis.  IMPRESSION: Hypoechoic focus within the anterior wall of the lower uterus most likely to represent scar from C-section. Minimal fluid demonstrated in the cervical canal.  Endometrium is otherwise unremarkable. Normal follicular changes in the ovaries.  Small amount of free fluid in the pelvis is likely physiologic.  Original Report Authenticated By: Marlon Pel, M.D.   US Pelvis Complete  01/31/2012  *RADIOLOGY REPORT*  Clinical Data: Left lower quadrant pain for 3 days. History of prior C-section and tubal ligation.  TRANSABDOMINAL AND TRANSVAGINAL ULTRASOUND OF PELVIS Technique:  Both transabdominal and transvaginal ultrasound examinations of the pelvis were performed. Transabdominal technique was performed for global imaging of the pelvis including uterus, ovaries, adnexal regions, and pelvic cul-de-sac.  Comparison: CT 01/04/2011   It was necessary to proceed with endovaginal exam following the transabdominal exam to visualize the cervix and ovaries.  Findings:  Uterus: The uterus measures 11.2 x 4.5 x 5.1 cm. The uterus is retroflexed. At the juncture of the body and lower uterine segment anteriorly, there is a focal hypoechoic region in the uterine myometrium.  This is in the expected area of scar from C-section  and likely represents scarring.  The myometrium is otherwise homogeneous.  No focal masses are demonstrated.  Endometrium: Endometrial stripe thickness is upper limits of normal, measuring about 11 mm double wall.  Minimal fluid is demonstrated in the cervical canal region.  Right ovary:  The right ovary measures 4.8 x 2.88-0.8 cm.  Normal follicular changes are demonstrated with a dominant follicle measuring about 1.7 cm diameter.  No abnormal adnexal masses.  Left ovary: The left ovary measures 4 x 2.6 x 3.1 cm.  Normal follicular changes are demonstrated.  Small hemorrhagic follicle measuring about 1 cm diameter.  Color flow Doppler images demonstrate flow within both ovaries.  Other findings: Small amount of free fluid in the pelvis.  IMPRESSION: Hypoechoic focus within the anterior wall of the lower uterus most likely to represent scar from C-section. Minimal fluid demonstrated in the cervical canal.  Endometrium is otherwise unremarkable. Normal follicular changes in the ovaries.  Small amount of free fluid in the pelvis is likely physiologic.  Original Report Authenticated By: Marlon Pel, M.D.     1. Abdominal pain  2. Medication refill       MDM  The patient requests a medication refill of her pain medicine because it was lost on the bus.  She also requests a cheaper antibiotic because she was unable to afford the Zofran.  She still has left lower quadrant pain which is unchanged from her prior pain.  Is not worse and therefore does not need to be worked back up again today.  Her workup was completed 2 days ago and is consistent with a ruptured left ovarian cyst.  Patient's vital signs are normal.  Shows she is to return to the emergency department for new or worsening symptoms        Lyanne Co, MD 02/02/12 [phone]

## 2012-02-02 NOTE — ED Notes (Signed)
Returned to ED for eval of continued abd pain. Pt was here in ED on Saturday evening with same and dx with ovarian cysts. Pt states her husband went to get her meds filled then left them on the bus. Pt unable to eat due to nausea. Appears in pain.

## 2012-02-03 NOTE — ED Notes (Signed)
+  GC Chart sent to EDP office for review.  

## 2012-02-05 NOTE — ED Notes (Signed)
Chart returned from EDP office. Call pharmacy choice please. Prescribed Azithromycin 2 grams PO x 1 dose. No refills.Please have patient follow-up with [facility]. Prescribed/reviewed by Drucie Opitz PA-C.

## 2012-03-08 ENCOUNTER — Encounter (HOSPITAL_COMMUNITY): Payer: Self-pay | Admitting: Emergency Medicine

## 2012-03-08 ENCOUNTER — Emergency Department (HOSPITAL_COMMUNITY)
Admission: EM | Admit: 2012-03-08 | Discharge: 2012-03-08 | Disposition: A | Discharge status: Home / Self Care | Payer: Self-pay | Attending: Emergency Medicine | Admitting: Emergency Medicine

## 2012-03-08 DIAGNOSIS — F17200 Nicotine dependence, unspecified, uncomplicated: Secondary | ICD-10-CM | POA: Insufficient documentation

## 2012-03-08 DIAGNOSIS — K0889 Other specified disorders of teeth and supporting structures: Secondary | ICD-10-CM

## 2012-03-08 DIAGNOSIS — K029 Dental caries, unspecified: Secondary | ICD-10-CM | POA: Insufficient documentation

## 2012-03-08 MED ORDER — PENICILLIN V POTASSIUM 500 MG PO TABS: 500.0000 mg | ORAL_TABLET | Freq: Three times a day (TID) | ORAL | Status: AC

## 2012-03-08 MED ORDER — IBUPROFEN 800 MG PO TABS
800.0000 mg | ORAL_TABLET | Freq: Once | ORAL | Status: AC
  Administered 2012-03-08: 800 mg via ORAL
  Filled 2012-03-08: qty 1

## 2012-03-08 MED ORDER — IBUPROFEN 800 MG PO TABS: 800.0000 mg | ORAL_TABLET | Freq: Three times a day (TID) | ORAL | Status: AC

## 2012-03-08 NOTE — ED Notes (Signed)
Pt c/o left upper tooth pain x 2 days 

## 2012-03-08 NOTE — ED Notes (Signed)
Pt came out of room and was continuing to argue with Greta Doom, PA ref. Wanting pain med.  Pt was offered dental block but refused.  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  Pt st's she is going to another MD to get pain meds

## 2012-03-08 NOTE — Discharge Instructions (Signed)
Dental Pain  A tooth ache may be caused by cavities (tooth decay). Cavities expose the nerve of the tooth to air and hot or cold temperatures. It may come from an infection or abscess (also called a boil or furuncle) around your tooth. It is also often caused by dental caries (tooth decay). This causes the pain you are having.  DIAGNOSIS   Your caregiver can diagnose this problem by exam.  TREATMENT   · If caused by an infection, it may be treated with medications which kill germs (antibiotics) and pain medications as prescribed by your caregiver. Take medications as directed.  · Only take over-the-counter or prescription medicines for pain, discomfort, or fever as directed by your caregiver.  · Whether the tooth ache today is caused by infection or dental disease, you should see your dentist as soon as possible for further care.  SEEK MEDICAL CARE IF:  The exam and treatment you received today has been provided on an emergency basis only. This is not a substitute for complete medical or dental care. If your problem worsens or new problems (symptoms) appear, and you are unable to meet with your dentist, call or return to this location.  SEEK IMMEDIATE MEDICAL CARE IF:   · You have a fever.  · You develop redness and swelling of your face, jaw, or neck.  · You are unable to open your mouth.  · You have severe pain uncontrolled by pain medicine.  MAKE SURE YOU:   · Understand these instructions.  · Will watch your condition.  · Will get help right away if you are not doing well or get worse.  Document Released: 09/29/2005 Document Revised: 09/18/2011 Document Reviewed: 05/17/2008  [employer]® Patient Information ©2012 [employer].

## 2012-03-08 NOTE — ED Provider Notes (Signed)
History     CSN: [id number]  Arrival date & time 03/08/12  1347   First MD Initiated Contact with Patient 03/08/12 [phone]      Chief Complaint  Patient presents with  . Dental Pain    (Consider location/radiation/quality/duration/timing/severity/associated sxs/prior treatment) HPI  26 year old female presents complaining of dental pain. Patient reports for the past 2 days she has experienced gradual onset of pain to the left upper molar. Describe pain as a sharp and throbbing sensation. Pain is radiating to her left ear. Patient also experiencing swelling to her left cheek. States cold air makes her worse. Patient has tried Orajel, and ibuprofen without adequate relief. Patient denies fever, throat swelling, hearing changes, neck pain, or rash. She denies any recent trauma.  History reviewed. No pertinent past medical history.  Past Surgical History  Procedure Date  . Cholecystectomy   . Cesarean section   . Tubal ligation     History reviewed. No pertinent family history.  History  Substance Use Topics  . Smoking status: Current Everyday Smoker -- 0.5 packs/day  . Smokeless tobacco: Never Used  . Alcohol Use: No    OB History    Grav Para Term Preterm Abortions TAB SAB Ect Mult Living                  Review of Systems  Constitutional: Negative for fever.  HENT: Positive for dental problem. Negative for ear discharge.   Skin: Negative for rash.    Allergies  Review of patient's allergies indicates no known allergies.  Home Medications  No current outpatient prescriptions on file.  BP 146/82  Pulse 97  Temp(Src) 98.6 F (37 C) (Oral)  Resp 20  SpO2 100%  Physical Exam  Nursing note and vitals reviewed. Constitutional: She appears well-developed and well-nourished. No distress.  HENT:  Head: Normocephalic and atraumatic.  Right Ear: External ear normal.  Left Ear: External ear normal.  Nose: Nose normal.  Mouth/Throat: Oropharynx is clear and moist.  No oropharyngeal exudate.    Eyes: Conjunctivae are normal.  Neck: Normal range of motion. Neck supple.  Lymphadenopathy:    She has no cervical adenopathy.  Neurological: She is alert.  Skin: Skin is warm. No rash noted.    ED Course  Procedures (including critical care time)  Labs Reviewed - No data to display No results found.   No diagnosis found.    MDM  Dental pain with evidence of dental decay.  Will d/c with abx and NSAIDs.  Dentist referral given.          Fayrene Helper, PA-C 03/08/12 1533

## 2012-03-08 NOTE — ED Notes (Signed)
Pt st's she needs something stronger than Ibuprofen that she has been taking this and it doesn't work. Pt also requesting work note.

## 2012-03-13 NOTE — ED Provider Notes (Signed)
Medical screening examination/treatment/procedure(s) were performed by non-physician practitioner and as supervising physician I was immediately available for consultation/collaboration.  Meyli Boice, MD 03/13/12 0928 

## 2012-06-05 ENCOUNTER — Inpatient Hospital Stay (HOSPITAL_COMMUNITY)
Admission: EM | Admit: 2012-06-05 | Discharge: 2012-06-15 | DRG: 690 | Disposition: A | Discharge status: Home / Self Care | Payer: MEDICAID | Attending: Internal Medicine | Admitting: Internal Medicine

## 2012-06-05 ENCOUNTER — Emergency Department (HOSPITAL_COMMUNITY): Payer: Self-pay

## 2012-06-05 DIAGNOSIS — E860 Dehydration: Secondary | ICD-10-CM

## 2012-06-05 DIAGNOSIS — K029 Dental caries, unspecified: Secondary | ICD-10-CM | POA: Diagnosis present

## 2012-06-05 DIAGNOSIS — R509 Fever, unspecified: Secondary | ICD-10-CM

## 2012-06-05 DIAGNOSIS — E876 Hypokalemia: Secondary | ICD-10-CM

## 2012-06-05 DIAGNOSIS — K0889 Other specified disorders of teeth and supporting structures: Secondary | ICD-10-CM

## 2012-06-05 DIAGNOSIS — D72829 Elevated white blood cell count, unspecified: Secondary | ICD-10-CM | POA: Diagnosis present

## 2012-06-05 DIAGNOSIS — D649 Anemia, unspecified: Secondary | ICD-10-CM

## 2012-06-05 DIAGNOSIS — R197 Diarrhea, unspecified: Secondary | ICD-10-CM

## 2012-06-05 DIAGNOSIS — R000 Tachycardia, unspecified: Secondary | ICD-10-CM | POA: Diagnosis present

## 2012-06-05 DIAGNOSIS — N12 Tubulo-interstitial nephritis, not specified as acute or chronic: Secondary | ICD-10-CM

## 2012-06-05 DIAGNOSIS — N10 Acute tubulo-interstitial nephritis: Principal | ICD-10-CM | POA: Diagnosis present

## 2012-06-05 DIAGNOSIS — K047 Periapical abscess without sinus: Secondary | ICD-10-CM

## 2012-06-05 DIAGNOSIS — R109 Unspecified abdominal pain: Secondary | ICD-10-CM | POA: Diagnosis present

## 2012-06-05 DIAGNOSIS — R112 Nausea with vomiting, unspecified: Secondary | ICD-10-CM

## 2012-06-05 LAB — URINALYSIS, ROUTINE W REFLEX MICROSCOPIC
Nitrite: NEGATIVE
Specific Gravity, Urine: 1.017 (ref 1.005–1.030)
Urobilinogen, UA: 2 mg/dL — ABNORMAL HIGH (ref 0.0–1.0)
pH: 6.5 (ref 5.0–8.0)

## 2012-06-05 LAB — COMPREHENSIVE METABOLIC PANEL
Albumin: 3.5 g/dL (ref 3.5–5.2)
Alkaline Phosphatase: 81 U/L (ref 39–117)
BUN: 12 mg/dL (ref 6–23)
Creatinine, Ser: 0.8 mg/dL (ref 0.50–1.10)
GFR calc Af Amer: >90 mL/min (ref >90)
Glucose, Bld: 102 mg/dL — ABNORMAL HIGH (ref 70–99)
Potassium: 2.9 mEq/L — ABNORMAL LOW (ref 3.5–5.1)
Total Bilirubin: 1.1 mg/dL (ref 0.3–1.2)
Total Protein: 6.6 g/dL (ref 6.0–8.3)

## 2012-06-05 LAB — URINE MICROSCOPIC-ADD ON

## 2012-06-05 LAB — CBC WITH DIFFERENTIAL/PLATELET
Basophils Absolute: 0 10*3/uL (ref 0.0–0.1)
Basophils Relative: 0 % (ref 0–1)
Eosinophils Absolute: 0 10*3/uL (ref 0.0–0.7)
Eosinophils Relative: 0 % (ref 0–5)
MCH: 28.2 pg (ref 26.0–34.0)
MCV: 82.1 fL (ref 78.0–100.0)
Neutrophils Relative %: 86 % — ABNORMAL HIGH (ref 43–77)
Platelets: 246 10*3/uL (ref 150–400)
RDW: 13.7 % (ref 11.5–15.5)

## 2012-06-05 LAB — LIPASE, BLOOD: Lipase: 9 U/L — ABNORMAL LOW (ref 11–59)

## 2012-06-05 MED ORDER — SODIUM CHLORIDE 0.9 % IV SOLN
1000.0000 mL | INTRAVENOUS | Status: DC
  Administered 2012-06-05 – 2012-06-06 (×3): 1000 mL via INTRAVENOUS

## 2012-06-05 MED ORDER — ACETAMINOPHEN 325 MG PO TABS
650.0000 mg | ORAL_TABLET | Freq: Once | ORAL | Status: AC
  Administered 2012-06-05: 650 mg via ORAL
  Filled 2012-06-05: qty 2

## 2012-06-05 MED ORDER — ONDANSETRON HCL 4 MG/2ML IJ SOLN
4.0000 mg | Freq: Once | INTRAMUSCULAR | Status: AC
  Administered 2012-06-05: 4 mg via INTRAVENOUS
  Filled 2012-06-05: qty 2

## 2012-06-05 MED ORDER — HYDROMORPHONE HCL PF 1 MG/ML IJ SOLN
1.0000 mg | Freq: Once | INTRAMUSCULAR | Status: AC
  Administered 2012-06-05: 1 mg via INTRAVENOUS
  Filled 2012-06-05: qty 1

## 2012-06-05 MED ORDER — SODIUM CHLORIDE 0.9 % IV SOLN
1000.0000 mL | Freq: Once | INTRAVENOUS | Status: AC
  Administered 2012-06-05: 1000 mL via INTRAVENOUS

## 2012-06-05 NOTE — ED Provider Notes (Signed)
History     CSN: [id number]  Arrival date & time 06/05/12  1740   First MD Initiated Contact with Patient 06/05/12 1744      Chief Complaint  Patient presents with  . Dental Pain     The history is provided by the patient.   patient ports nausea and vomiting for approximately 24 hours.  She developed fever to 102.9 today on arrival to the emergency department.  Her vomitus nonbloody nonbilious.  Denies melena or hematochezia.  She reports left-sided abdominal pain without radiation.  She reports she was recently treated with penicillin for a "tooth abscess" but did not complete the course.  She reports ongoing dental pain at this time.  She's had a cholecystectomy.  She denies dysuria urinary frequency.  Nothing worsens or improves her symptoms.  She is breathing without any difficulty and continues to swallow without any problems.  No past medical history on file.  Past Surgical History  Procedure Date  . Cholecystectomy   . Cesarean section   . Tubal ligation     No family history on file.  History  Substance Use Topics  . Smoking status: Current Everyday Smoker -- 0.5 packs/day  . Smokeless tobacco: Never Used  . Alcohol Use: No    OB History    Grav Para Term Preterm Abortions TAB SAB Ect Mult Living                  Review of Systems  All other systems reviewed and are negative.    Allergies  Review of patient's allergies indicates no known allergies.  Home Medications   Current Outpatient Rx  Name Route Sig Dispense Refill  . PENICILLIN V POTASSIUM PO Oral Take 1 tablet by mouth daily. Pt took 1 tablet of an old rx. She doesn't know the strength of medication and pt's pharmacy is currently closed.      BP 106/56  Pulse 120  Temp 102.9 F (39.4 C) (Oral)  Resp 20  SpO2 98%  Physical Exam  Nursing note and vitals reviewed. Constitutional: She is oriented to person, place, and time. She appears well-developed and well-nourished. No distress.  HENT:   Head: Normocephalic and atraumatic.       The patient has evidence of dental decay.  She has evidence of significant atrophic decay of her second left upper molar without gingival swelling or fluctuance  Eyes: EOM are normal.  Neck: Normal range of motion.  Cardiovascular: Normal rate, regular rhythm and normal heart sounds.   Pulmonary/Chest: Effort normal and breath sounds normal.  Abdominal: Soft. She exhibits no distension.       Mild left-sided abdominal tenderness without guarding or rebound  Musculoskeletal: Normal range of motion.  Neurological: She is alert and oriented to person, place, and time.  Skin: Skin is warm and dry.  Psychiatric: She has a normal mood and affect. Judgment normal.    ED Course  Procedures (including critical care time)  Labs Reviewed  COMPREHENSIVE METABOLIC PANEL - Abnormal; Notable for the following:    Potassium 2.9 (*)     Glucose, Bld 102 (*)     AST 43 (*)     ALT 52 (*)     All other components within normal limits  LIPASE, BLOOD - Abnormal; Notable for the following:    Lipase 9 (*)     All other components within normal limits  URINALYSIS, ROUTINE W REFLEX MICROSCOPIC - Abnormal; Notable for the following:  APPearance CLOUDY (*)     Bilirubin Urine LARGE (*)     Ketones, ur TRACE (*)     Protein, ur 30 (*)     Urobilinogen, UA 2.0 (*)     Leukocytes, UA MODERATE (*)     All other components within normal limits  CBC WITH DIFFERENTIAL - Abnormal; Notable for the following:    WBC 21.2 (*)     RBC 3.69 (*)     Hemoglobin 10.4 (*)     HCT 30.3 (*)     Neutrophils Relative 86 (*)     Neutro Abs 18.2 (*)     Lymphocytes Relative 7 (*)     Monocytes Absolute 1.6 (*)     All other components within normal limits  PREGNANCY, URINE  URINE MICROSCOPIC-ADD ON   Dg Chest 2 View  06/05/2012  *RADIOLOGY REPORT*  Clinical Data: 26 year old female with headache dizziness difficulty breathing pain.  CHEST - 2 VIEW  Comparison:  01/12/2011.  Findings: Lower lung volumes.  Mild increased interstitial markings are felt to reflect crowding.  Cardiac size and mediastinal contours are within normal limits.  Visualized tracheal air column is within normal limits.  Hair artifact at the thoracic inlet.  No pneumothorax, pulmonary edema or pleural effusion.  No consolidation.  Right upper quadrant surgical clips. No acute osseous abnormality identified.  IMPRESSION: Low lung volumes, otherwise no acute cardiopulmonary abnormality.   Original Report Authenticated By: Ulla Potash III, M.D.      1. Nausea & vomiting   2. Dental infection   3. Dehydration       MDM  Obtain CT scan to better define fever and her left-sided abdominal pain.  There is a colitis versus diverticulitis.  She does have what appears to be an infected left upper second molar.  Am not convinced this is the cause of her 102.9 fever.  She has no facial swelling.  She has nothing to suggest she has a large abscess under there.  She also has had URI symptoms with coughing congestion for the past several days.  Nausea vomiting and left-sided abdominal pain is been her major complaint.  Her CT is normal the patient started feeling much better she should be a little home.  She will need to at least go home on penicillin with dental follow up   1:38 AM Care to Dr Adriana Simas to follow up on CT scan        Lyanne Co, MD 06/06/12 [phone]

## 2012-06-05 NOTE — ED Notes (Signed)
Per EMS, pt reports that she was treated for a "tooth abscess" and was prescribed penicillin but did not finish the course (used approx half of the script). Pt did not have the money to get the tooth extracted at this time. Symptoms worsening as of 2 days ago where pt was unable to eat; began having hot flashes, chills, n/v. Generalized complaint of malaise/weakness, hot to touch.

## 2012-06-05 NOTE — ED Notes (Signed)
[id number]<AV> Expected date:06/05/12<BR> Expected time: 5:36 PM<BR> Means of arrival:Ambulance<BR> Comments:<BR> Tooth abscess, fever, chills HR 110

## 2012-06-06 ENCOUNTER — Encounter (HOSPITAL_COMMUNITY): Payer: Self-pay | Admitting: Internal Medicine

## 2012-06-06 DIAGNOSIS — N151 Renal and perinephric abscess: Secondary | ICD-10-CM | POA: Insufficient documentation

## 2012-06-06 DIAGNOSIS — E876 Hypokalemia: Secondary | ICD-10-CM | POA: Diagnosis present

## 2012-06-06 DIAGNOSIS — R112 Nausea with vomiting, unspecified: Secondary | ICD-10-CM

## 2012-06-06 DIAGNOSIS — K0889 Other specified disorders of teeth and supporting structures: Secondary | ICD-10-CM | POA: Diagnosis present

## 2012-06-06 DIAGNOSIS — R000 Tachycardia, unspecified: Secondary | ICD-10-CM | POA: Diagnosis present

## 2012-06-06 DIAGNOSIS — R109 Unspecified abdominal pain: Secondary | ICD-10-CM | POA: Diagnosis present

## 2012-06-06 DIAGNOSIS — R509 Fever, unspecified: Secondary | ICD-10-CM | POA: Diagnosis present

## 2012-06-06 DIAGNOSIS — N12 Tubulo-interstitial nephritis, not specified as acute or chronic: Secondary | ICD-10-CM | POA: Diagnosis present

## 2012-06-06 MED ORDER — HYDROCODONE-ACETAMINOPHEN 5-500 MG PO TABS: 1.0000 | ORAL_TABLET | Freq: Four times a day (QID) | ORAL | Status: AC | PRN

## 2012-06-06 MED ORDER — DEXTROSE 5 % IV SOLN: 1.0000 g | INTRAVENOUS | Status: DC

## 2012-06-06 MED ORDER — IOHEXOL 300 MG/ML  SOLN
100.0000 mL | Freq: Once | INTRAMUSCULAR | Status: AC | PRN
  Administered 2012-06-06: 100 mL via INTRAVENOUS

## 2012-06-06 MED ORDER — PENICILLIN V POTASSIUM 500 MG PO TABS: 500.0000 mg | ORAL_TABLET | Freq: Four times a day (QID) | ORAL | Status: AC

## 2012-06-06 MED ORDER — ONDANSETRON HCL 4 MG/2ML IJ SOLN
4.0000 mg | Freq: Once | INTRAMUSCULAR | Status: AC
  Administered 2012-06-06: 4 mg via INTRAVENOUS
  Filled 2012-06-06: qty 2

## 2012-06-06 MED ORDER — ONDANSETRON HCL 4 MG PO TABS
4.0000 mg | ORAL_TABLET | Freq: Four times a day (QID) | ORAL | Status: DC | PRN
  Administered 2012-06-06 – 2012-06-08 (×3): 4 mg via ORAL
  Filled 2012-06-06 (×2): qty 1

## 2012-06-06 MED ORDER — HYDROMORPHONE HCL PF 1 MG/ML IJ SOLN
1.0000 mg | Freq: Once | INTRAMUSCULAR | Status: AC
  Administered 2012-06-06: 1 mg via INTRAVENOUS
  Filled 2012-06-06: qty 1

## 2012-06-06 MED ORDER — ACETAMINOPHEN 650 MG RE SUPP: 650.0000 mg | Freq: Four times a day (QID) | RECTAL | Status: DC | PRN

## 2012-06-06 MED ORDER — ZOLPIDEM TARTRATE 5 MG PO TABS: 5.0000 mg | ORAL_TABLET | Freq: Every evening | ORAL | Status: DC | PRN

## 2012-06-06 MED ORDER — ENOXAPARIN SODIUM 40 MG/0.4ML ~~LOC~~ SOLN
40.0000 mg | SUBCUTANEOUS | Status: DC
  Administered 2012-06-06 – 2012-06-08 (×3): 40 mg via SUBCUTANEOUS
  Filled 2012-06-06 (×3): qty 0.4

## 2012-06-06 MED ORDER — ACETAMINOPHEN 500 MG PO TABS
1000.0000 mg | ORAL_TABLET | Freq: Once | ORAL | Status: AC
  Administered 2012-06-06: 1000 mg via ORAL
  Filled 2012-06-06: qty 2

## 2012-06-06 MED ORDER — DEXTROSE 5 % IV SOLN
1.0000 g | INTRAVENOUS | Status: DC
  Administered 2012-06-07 – 2012-06-11 (×5): 1 g via INTRAVENOUS
  Filled 2012-06-06 (×5): qty 10

## 2012-06-06 MED ORDER — VANCOMYCIN HCL IN DEXTROSE 1-5 GM/200ML-% IV SOLN
1000.0000 mg | Freq: Three times a day (TID) | INTRAVENOUS | Status: DC
  Administered 2012-06-06 – 2012-06-07 (×5): 1000 mg via INTRAVENOUS
  Filled 2012-06-06 (×6): qty 200

## 2012-06-06 MED ORDER — ACETAMINOPHEN 325 MG PO TABS
650.0000 mg | ORAL_TABLET | Freq: Four times a day (QID) | ORAL | Status: DC | PRN
  Administered 2012-06-06 – 2012-06-15 (×7): 650 mg via ORAL
  Filled 2012-06-06 (×8): qty 2

## 2012-06-06 MED ORDER — ONDANSETRON HCL 4 MG/2ML IJ SOLN
4.0000 mg | Freq: Four times a day (QID) | INTRAMUSCULAR | Status: DC | PRN
  Administered 2012-06-06 – 2012-06-09 (×10): 4 mg via INTRAVENOUS
  Filled 2012-06-06 (×11): qty 2

## 2012-06-06 MED ORDER — HYDROMORPHONE HCL PF 1 MG/ML IJ SOLN
0.5000 mg | INTRAMUSCULAR | Status: DC | PRN
  Administered 2012-06-06 – 2012-06-07 (×5): 1 mg via INTRAVENOUS
  Filled 2012-06-06 (×5): qty 1

## 2012-06-06 MED ORDER — ALUM & MAG HYDROXIDE-SIMETH 200-200-20 MG/5ML PO SUSP
30.0000 mL | Freq: Four times a day (QID) | ORAL | Status: DC | PRN
  Administered 2012-06-06 – 2012-06-13 (×14): 30 mL via ORAL
  Filled 2012-06-06 (×15): qty 30

## 2012-06-06 MED ORDER — ONDANSETRON 8 MG PO TBDP: 8.0000 mg | ORAL_TABLET | Freq: Three times a day (TID) | ORAL | Status: AC | PRN

## 2012-06-06 MED ORDER — POTASSIUM CHLORIDE 10 MEQ/100ML IV SOLN
10.0000 meq | INTRAVENOUS | Status: AC
  Administered 2012-06-06 (×3): 10 meq via INTRAVENOUS
  Filled 2012-06-06 (×3): qty 100

## 2012-06-06 MED ORDER — OXYCODONE HCL 5 MG PO TABS
5.0000 mg | ORAL_TABLET | ORAL | Status: DC | PRN
  Administered 2012-06-06 – 2012-06-08 (×7): 5 mg via ORAL
  Filled 2012-06-06 (×10): qty 1

## 2012-06-06 MED ORDER — PENICILLIN V POTASSIUM 500 MG PO TABS
500.0000 mg | ORAL_TABLET | Freq: Once | ORAL | Status: AC
  Administered 2012-06-06: 500 mg via ORAL
  Filled 2012-06-06: qty 1

## 2012-06-06 MED ORDER — DEXTROSE 5 % IV SOLN
1.0000 g | Freq: Once | INTRAVENOUS | Status: AC
  Administered 2012-06-06: 1 g via INTRAVENOUS
  Filled 2012-06-06: qty 10

## 2012-06-06 MED ORDER — SODIUM CHLORIDE 0.9 % IV SOLN
INTRAVENOUS | Status: DC
  Administered 2012-06-08: 1000 mL via INTRAVENOUS
  Administered 2012-06-08: 09:00:00 via INTRAVENOUS
  Administered 2012-06-09: 1000 mL via INTRAVENOUS

## 2012-06-06 NOTE — H&P (Addendum)
[facility] History and Physical  Karen Warren:[id number] DOB: 1985/10/24 DOA: 06/05/2012  Referring physician: PCP: Sheila Oats, MD   Chief Complaint: Fever, Left Sided ABD and Back Pain and Dental Pain  HPI: Karen Warren is a 26 y.o. female who presented to the ED with initial complaints of dental pain and was evaluated in the ED and placed on Pen VK, but then began to complain of having fevers and chills and back and ABD Pain on the left side. She reports having fever to 102.9 and chills over the past 2 days.  She has had nausea and vomiting, but denies any hematemesis, hematochezia, diarrhea or melena passage.     Review of Systems: The patient denies anorexia, weight loss, vision loss, decreased hearing, hoarseness, chest pain, syncope, dyspnea on exertion, peripheral edema, balance deficits, hemoptysis, melena, hematochezia, severe indigestion/heartburn, hematuria, incontinence, genital sores, muscle weakness, suspicious skin lesions, transient blindness, difficulty walking, depression, unusual weight change, abnormal bleeding, enlarged lymph nodes, angioedema, and breast masses.    No past medical history on file. Past Surgical History  Procedure Date  . Cholecystectomy   . Cesarean section   . Tubal ligation      Prior to Admission medications   Medication Sig Start Date End Date Taking? Authorizing Provider  PENICILLIN V POTASSIUM PO Take 1 tablet by mouth daily. Pt took 1 tablet of an old rx. She doesn't know the strength of medication and pt's pharmacy is currently closed.   Yes Historical Provider, MD  HYDROcodone-acetaminophen (VICODIN) 5-500 MG per tablet Take 1 tablet by mouth every 6 (six) hours as needed for pain. 06/06/12 06/16/12  Lyanne Co, MD  ondansetron (ZOFRAN ODT) 8 MG disintegrating tablet Take 1 tablet (8 mg total) by mouth every 8 (eight) hours as needed for nausea. 06/06/12 06/13/12  Lyanne Co, MD  penicillin v potassium (VEETID) 500 MG  tablet Take 1 tablet (500 mg total) by mouth 4 (four) times daily. 06/06/12 06/13/12  Lyanne Co, MD    Allergies:    No Known Allergies   Social History:  Smokes 1/4 pack of cigarettes a day X 10 years.   reports that she has been smoking.  She has never used smokeless tobacco. She reports that she does not drink alcohol or use illicit drugs.   Family History  Problem Relation Age of Onset  . Hypertension Mother   . Hypertension Maternal Grandmother   . Diabetes Maternal Grandmother       Physical Exam:   GEN:  Pleasant 26 year old well nourished and well developed African American Femaleexamined  and in discomfort but no acute distress; cooperative with exam Filed Vitals:   06/05/12 1745 06/05/12 2147 06/06/12 0315  BP: 106/56  104/64  Pulse: 120  102  Temp: 102.9 F (39.4 C) 98.1 F (36.7 C)   TempSrc: Oral Oral   Resp: 20  21  SpO2: 98%  99%   Blood pressure 104/64, pulse 102, temperature 98.1 F (36.7 C), temperature source Oral, resp. rate 21, last menstrual period 05/18/2012, SpO2 99.00%. PSYCH: SHe is alert and oriented x4; does not appear anxious does not appear depressed; affect is normal HEENT: Normocephalic and Atraumatic, Mucous membranes pink; PERRLA; EOM intact; Fundi:  Benign;  No scleral icterus, Nares: Patent, Oropharynx: Clear, Fair Dentition, Neck:  FROM, no cervical lymphadenopathy nor thyromegaly or carotid bruit; no JVD; Breasts:: Not examined CHEST WALL: No tenderness CHEST: Normal respiration, clear to auscultation bilaterally HEART: Regular rate and  rhythm; no murmurs rubs or gallops BACK: No kyphosis or scoliosis; +Left CVA tenderness ABDOMEN: Positive Bowel Sounds, soft  Mildly tender Left lateral and CVA No masses, No Rebound , No Guarding.   Rectal Exam: Not done EXTREMITIES: No bone or joint deformity; age-appropriate arthropathy of the hands and knees; no cyanosis, clubbing or edema; no ulcerations. Genitalia: not examined PULSES: 2+ and  symmetric SKIN: Normal hydration no rash or ulceration CNS: Cranial nerves 2-12 grossly intact no focal neurologic deficit  Labs on Admission:  Basic Metabolic Panel:  Lab 06/05/12 1610  NA 136  K 2.9*  CL 102  CO2 23  GLUCOSE 102*  BUN 12  CREATININE 0.80  CALCIUM 8.9  MG --  PHOS --   Liver Function Tests:  Lab 06/05/12 1818  AST 43*  ALT 52*  ALKPHOS 81  BILITOT 1.1  PROT 6.6  ALBUMIN 3.5    Lab 06/05/12 1818  LIPASE 9*  AMYLASE --   No results found for this basename: AMMONIA:5 in the last 168 hours CBC:  Lab 06/05/12 1902  WBC 21.2*  NEUTROABS 18.2*  HGB 10.4*  HCT 30.3*  MCV 82.1  PLT 246    Radiological Exams on Admission: Dg Chest 2 View  06/05/2012  *RADIOLOGY REPORT*  Clinical Data: 26 year old female with headache dizziness difficulty breathing pain.  CHEST - 2 VIEW  Comparison: 01/12/2011.  Findings: Lower lung volumes.  Mild increased interstitial markings are felt to reflect crowding.  Cardiac size and mediastinal contours are within normal limits.  Visualized tracheal air column is within normal limits.  Hair artifact at the thoracic inlet.  No pneumothorax, pulmonary edema or pleural effusion.  No consolidation.  Right upper quadrant surgical clips. No acute osseous abnormality identified.  IMPRESSION: Low lung volumes, otherwise no acute cardiopulmonary abnormality.   Original Report Authenticated By: Harley Hallmark, M.D.    Ct Abdomen Pelvis W Contrast  06/06/2012  *RADIOLOGY REPORT*  Clinical Data: Left lower quadrant pain.  CT ABDOMEN AND PELVIS WITH CONTRAST  Technique:  Multidetector CT imaging of the abdomen and pelvis was performed following the standard protocol during bolus administration of intravenous contrast.  Contrast: OMNIPAQUE IOHEXOL 300 MG/ML  SOLN  Comparison: 01/04/2011  Findings: Mild dependent atelectasis or scarring.  Suboptimal contrast bolus timing.  Allowing for this, no focal abnormality identified within the liver,  spleen, pancreas, or adrenal glands.  Absent gallbladder.  No biliary ductal dilatation.  Heterogeneous attenuation of the kidneys bilaterally with wedge- shaped areas of hypoattenuation.  More confluent ill-defined hypoattenuation within the interpolar - upper pole left kidney anteriorly measures 3 cm.  Mild perinephric fat stranding.  No hydronephrosis or hydroureter.  No bowel obstruction.  No CT evidence for colitis.  Normal appendix.  No free intraperitoneal air.  Small amount of free fluid within the pelvis.  Thin-walled bladder.  Adnexal cyst left greater than right, likely physiologic.  Normal caliber vasculature.  No acute osseous finding.  IMPRESSION: Heterogeneous renal enhancement suggests ascending infection/pyelonephritis. Largest area involving the interpolar/upper pole left kidney measures 3 cm, suspicious for developing abscess.  No loculated fluid collection at this time.   Original Report Authenticated By: Waneta Martins, M.D.     Assessment: Principal Problem:  *Pyelonephritis Active Problems:  Kidney, perinephric abscess  Abdominal pain  Pain, dental  Fever  Tachycardia  Nausea & vomiting  Hypokalemia    Plan:  Admit to Med/Surg Bed Blood Cultures X 2,  And Urine C=S ordered IV Vanc, and  IV Rocephin IVFs for Fluid Resuscitation.   Replete K+, and Check Magnesium Pain Control and Anti-emetics PRN DVT prophylaxis.      Code Status:  FULL CODE Disposition Plan:  Return to Home  Time spent: 60 Minutes  Ron Parker [facility] Pager [phone]  If 7PM-7AM, please contact night-coverage [email or web address] Password [email or web address] 06/06/2012, 6:37 AM

## 2012-06-06 NOTE — ED Notes (Signed)
Re drew one lab got it from her left hand .  Patient tolerated it well.

## 2012-06-06 NOTE — Progress Notes (Addendum)
ANTIBIOTIC CONSULT NOTE - INITIAL  Pharmacy Consult for Vancomycin Indication: Pyelonephritis, dental abscess  No Known Allergies  Patient Measurements: Height: 5\' 6"  (167.6 cm) Weight: 171 lb 8 oz (77.792 kg) IBW/kg (Calculated) : 59.3  Adjusted Body Weight:   Vital Signs: Temp: 102.9 F (39.4 C) (08/25 0647) Temp src: Rectal (08/25 0647) BP: 104/64 mmHg (08/25 0315) Pulse Rate: 102  (08/25 0315) Intake/Output from previous day:   Intake/Output from this shift:    Labs:  Basename 06/05/12 1902 06/05/12 1818  WBC 21.2* --  HGB 10.4* --  PLT 246 --  LABCREA -- --  CREATININE -- 0.80   Estimated Creatinine Clearance: 114.2 ml/min (by C-G formula based on Cr of 0.8). No results found for this basename: VANCOTROUGH:2,VANCOPEAK:2,VANCORANDOM:2,GENTTROUGH:2,GENTPEAK:2,GENTRANDOM:2,TOBRATROUGH:2,TOBRAPEAK:2,TOBRARND:2,AMIKACINPEAK:2,AMIKACINTROU:2,AMIKACIN:2, in the last 72 hours   Microbiology: No results found for this or any previous visit (from the past 720 hour(s)).  Medical History: No past medical history on file.  Medications:  Scheduled:    . sodium chloride  1,000 mL Intravenous Once   Followed by  . sodium chloride  1,000 mL Intravenous Once  . acetaminophen  1,000 mg Oral Once  . acetaminophen  650 mg Oral Once  . cefTRIAXone (ROCEPHIN)  IV  1 g Intravenous Once  . cefTRIAXone (ROCEPHIN)  IV  1 g Intravenous Q24H  . enoxaparin (LOVENOX) injection  40 mg Subcutaneous Q24H  .  HYDROmorphone (DILAUDID) injection  1 mg Intravenous Once  .  HYDROmorphone (DILAUDID) injection  1 mg Intravenous Once  .  HYDROmorphone (DILAUDID) injection  1 mg Intravenous Once  .  HYDROmorphone (DILAUDID) injection  1 mg Intravenous Once  . ondansetron (ZOFRAN) IV  4 mg Intravenous Once  . ondansetron (ZOFRAN) IV  4 mg Intravenous Once  . ondansetron (ZOFRAN) IV  4 mg Intravenous Once  . ondansetron (ZOFRAN) IV  4 mg Intravenous Once  . penicillin v potassium  500 mg Oral  Once  . potassium chloride  10 mEq Intravenous Q1 Hr x 3   Infusions:    . sodium chloride 1,000 mL (06/06/12 0532)  . sodium chloride     PRN: acetaminophen, acetaminophen, alum & mag hydroxide-simeth, HYDROmorphone (DILAUDID) injection, iohexol, ondansetron (ZOFRAN) IV, ondansetron, oxyCODONE, zolpidem Assessment:  26 yo F in ER with pyelonephritis,dental pain, perinephric abscess  Symptoms include fever,left sided ABD and back pain and dental pain  Goal of Therapy:  Vancomycin trough level 15-20 mcg/ml  Plan:  Vancomycin 1 Gram IV q 8 hours Measure antibiotic drug levels at steady state Follow up culture results  Loletta Specter 06/06/2012,7:54 AM

## 2012-06-06 NOTE — ED Notes (Signed)
Patient transported to CT 

## 2012-06-06 NOTE — Progress Notes (Addendum)
Addendum to admission note  Labs reviewed; I agree with the assessment and plan done by my colleague Additional plan - will add rocephin to the antibiotic regimen, obtain blood cultures 2 sets.  Manson Passey [facility] Pager [phone]

## 2012-06-06 NOTE — ED Provider Notes (Addendum)
History     CSN: [id number]  Arrival date & time 06/05/12  1740   First MD Initiated Contact with Patient 06/05/12 1744      Chief Complaint  Patient presents with  . Dental Pain    (Consider location/radiation/quality/duration/timing/severity/associated sxs/prior treatment) HPI  No past medical history on file.  Past Surgical History  Procedure Date  . Cholecystectomy   . Cesarean section   . Tubal ligation     No family history on file.  History  Substance Use Topics  . Smoking status: Current Everyday Smoker -- 0.5 packs/day  . Smokeless tobacco: Never Used  . Alcohol Use: No    OB History    Grav Para Term Preterm Abortions TAB SAB Ect Mult Living                  Review of Systems  Allergies  Review of patient's allergies indicates no known allergies.  Home Medications   Current Outpatient Rx  Name Route Sig Dispense Refill  . PENICILLIN V POTASSIUM PO Oral Take 1 tablet by mouth daily. Pt took 1 tablet of an old rx. She doesn't know the strength of medication and pt's pharmacy is currently closed.    Marland Kitchen HYDROCODONE-ACETAMINOPHEN 5-500 MG PO TABS Oral Take 1 tablet by mouth every 6 (six) hours as needed for pain. 20 tablet 0  . ONDANSETRON 8 MG PO TBDP Oral Take 1 tablet (8 mg total) by mouth every 8 (eight) hours as needed for nausea. 12 tablet 0  . PENICILLIN V POTASSIUM 500 MG PO TABS Oral Take 1 tablet (500 mg total) by mouth 4 (four) times daily. 40 tablet 0    BP 104/64  Pulse 102  Temp 98.1 F (36.7 C) (Oral)  Resp 21  SpO2 99%  LMP 05/18/2012  Physical Exam  ED Course  Procedures (including critical care time)  Labs Reviewed  COMPREHENSIVE METABOLIC PANEL - Abnormal; Notable for the following:    Potassium 2.9 (*)     Glucose, Bld 102 (*)     AST 43 (*)     ALT 52 (*)     All other components within normal limits  LIPASE, BLOOD - Abnormal; Notable for the following:    Lipase 9 (*)     All other components within normal limits    URINALYSIS, ROUTINE W REFLEX MICROSCOPIC - Abnormal; Notable for the following:    APPearance CLOUDY (*)     Bilirubin Urine LARGE (*)     Ketones, ur TRACE (*)     Protein, ur 30 (*)     Urobilinogen, UA 2.0 (*)     Leukocytes, UA MODERATE (*)     All other components within normal limits  CBC WITH DIFFERENTIAL - Abnormal; Notable for the following:    WBC 21.2 (*)     RBC 3.69 (*)     Hemoglobin 10.4 (*)     HCT 30.3 (*)     Neutrophils Relative 86 (*)     Neutro Abs 18.2 (*)     Lymphocytes Relative 7 (*)     Monocytes Absolute 1.6 (*)     All other components within normal limits  PREGNANCY, URINE  URINE MICROSCOPIC-ADD ON  URINE CULTURE   Dg Chest 2 View  06/05/2012  *RADIOLOGY REPORT*  Clinical Data: 26 year old female with headache dizziness difficulty breathing pain.  CHEST - 2 VIEW  Comparison: 01/12/2011.  Findings: Lower lung volumes.  Mild increased interstitial markings are felt to  reflect crowding.  Cardiac size and mediastinal contours are within normal limits.  Visualized tracheal air column is within normal limits.  Hair artifact at the thoracic inlet.  No pneumothorax, pulmonary edema or pleural effusion.  No consolidation.  Right upper quadrant surgical clips. No acute osseous abnormality identified.  IMPRESSION: Low lung volumes, otherwise no acute cardiopulmonary abnormality.   Original Report Authenticated By: Harley Hallmark, M.D.    Ct Abdomen Pelvis W Contrast  06/06/2012  *RADIOLOGY REPORT*  Clinical Data: Left lower quadrant pain.  CT ABDOMEN AND PELVIS WITH CONTRAST  Technique:  Multidetector CT imaging of the abdomen and pelvis was performed following the standard protocol during bolus administration of intravenous contrast.  Contrast: OMNIPAQUE IOHEXOL 300 MG/ML  SOLN  Comparison: 01/04/2011  Findings: Mild dependent atelectasis or scarring.  Suboptimal contrast bolus timing.  Allowing for this, no focal abnormality identified within the liver, spleen,  pancreas, or adrenal glands.  Absent gallbladder.  No biliary ductal dilatation.  Heterogeneous attenuation of the kidneys bilaterally with wedge- shaped areas of hypoattenuation.  More confluent ill-defined hypoattenuation within the interpolar - upper pole left kidney anteriorly measures 3 cm.  Mild perinephric fat stranding.  No hydronephrosis or hydroureter.  No bowel obstruction.  No CT evidence for colitis.  Normal appendix.  No free intraperitoneal air.  Small amount of free fluid within the pelvis.  Thin-walled bladder.  Adnexal cyst left greater than right, likely physiologic.  Normal caliber vasculature.  No acute osseous finding.  IMPRESSION: Heterogeneous renal enhancement suggests ascending infection/pyelonephritis. Largest area involving the interpolar/upper pole left kidney measures 3 cm, suspicious for developing abscess.  No loculated fluid collection at this time.   Original Report Authenticated By: Waneta Martins, M.D.      1. Nausea & vomiting   2. Dental infection   3. Dehydration       MDM  03 30:  Discussed case with Dr. Hillis Range..   discussed CT scan results.  Abscess does not need to be drained.    0500:  Discussed with Gen. medicine. Admit. IV Rocephin started. urine cultured      Donnetta Hutching, MD 06/06/12 1610  Donnetta Hutching, MD 06/06/12 [phone]

## 2012-06-07 DIAGNOSIS — R509 Fever, unspecified: Secondary | ICD-10-CM

## 2012-06-07 DIAGNOSIS — D72829 Elevated white blood cell count, unspecified: Secondary | ICD-10-CM | POA: Diagnosis present

## 2012-06-07 DIAGNOSIS — D649 Anemia, unspecified: Secondary | ICD-10-CM | POA: Diagnosis present

## 2012-06-07 LAB — VANCOMYCIN, TROUGH: Vancomycin Tr: 6.9 ug/mL — ABNORMAL LOW (ref 10.0–20.0)

## 2012-06-07 LAB — BASIC METABOLIC PANEL
BUN: 6 mg/dL (ref 6–23)
CO2: 23 mEq/L (ref 19–32)
Calcium: 8 mg/dL — ABNORMAL LOW (ref 8.4–10.5)
Creatinine, Ser: 0.86 mg/dL (ref 0.50–1.10)
Glucose, Bld: 88 mg/dL (ref 70–99)

## 2012-06-07 LAB — CBC
HCT: 26.6 % — ABNORMAL LOW (ref 36.0–46.0)
MCH: 28 pg (ref 26.0–34.0)
MCV: 82.6 fL (ref 78.0–100.0)
Platelets: 205 10*3/uL (ref 150–400)
RDW: 13.9 % (ref 11.5–15.5)

## 2012-06-07 LAB — URINE CULTURE: Special Requests: NORMAL

## 2012-06-07 MED ORDER — HYDROMORPHONE HCL PF 2 MG/ML IJ SOLN
2.0000 mg | INTRAMUSCULAR | Status: DC | PRN
  Administered 2012-06-07 – 2012-06-13 (×34): 2 mg via INTRAVENOUS
  Filled 2012-06-07 (×2): qty 1
  Filled 2012-06-07: qty 2
  Filled 2012-06-07 (×32): qty 1

## 2012-06-07 MED ORDER — POTASSIUM CHLORIDE 10 MEQ/100ML IV SOLN
10.0000 meq | INTRAVENOUS | Status: AC
  Administered 2012-06-07 (×3): 10 meq via INTRAVENOUS
  Filled 2012-06-07 (×3): qty 100

## 2012-06-07 MED ORDER — MAGNESIUM SULFATE 40 MG/ML IJ SOLN
2.0000 g | Freq: Once | INTRAMUSCULAR | Status: AC
  Administered 2012-06-07: 2 g via INTRAVENOUS
  Filled 2012-06-07: qty 50

## 2012-06-07 MED ORDER — ENSURE PUDDING PO PUDG
1.0000 | Freq: Three times a day (TID) | ORAL | Status: DC
  Administered 2012-06-07: 1 via ORAL
  Filled 2012-06-07 (×15): qty 1

## 2012-06-07 MED ORDER — VANCOMYCIN HCL 1000 MG IV SOLR
1250.0000 mg | Freq: Three times a day (TID) | INTRAVENOUS | Status: DC
  Administered 2012-06-08 (×2): 1250 mg via INTRAVENOUS
  Filled 2012-06-07 (×3): qty 1250

## 2012-06-07 MED ORDER — ACETAMINOPHEN 325 MG PO TABS
650.0000 mg | ORAL_TABLET | Freq: Once | ORAL | Status: AC
  Administered 2012-06-07: 650 mg via ORAL

## 2012-06-07 NOTE — Progress Notes (Signed)
[facility] PROGRESS NOTE  Karen Warren RUE:[id number] DOB: [birth year] DOA: 06/05/2012 PCP: Sheila Oats, MD  Brief narrative: 26 year old female who presented with persistent abdominal pain, fevers. Patient was found to have questionable abscess on CT abdomen pelvis and likely pyelonephritis.  Assessment/Plan:  Principal Problem:  *Abdominal pain with nausea  - Perhaps secondary to pyelonephritis but we cannot exclude STD (GC/Chlamydia), bacterial vaginosis - There was a questionable abscess on CT abdomen pelvis, I spoke with interventional radiology and their recommendation was to perhaps repeat scan if persistent fever or abdominal pain - For now we'll continue vancomycin which was started emergency room and Rocephin was added for possible other infectious etiologies noted above - Follow up urine culture results - Followup GC/Chlamydia and wet prep results  Active Problems:  Hypokalemia - Repleted - Magnesium level was 1.3 yesterday, we will repleted - Followup labs in the morning   Leukocytosis - Secondary to pyelonephritis versus GC/Chlamydia or bacterial vaginosis - Management as above   Anemia - Based on previous chart review baseline hemoglobin appears to be around 11 - Admission hemoglobin about 10 - No signs of active bleed - Followup CBC in the morning   Code Status: Full code Family Communication: None at bedside Disposition Plan: Home when stable  DEVINE, Aniceto Boss, MD  [facility] Pager [phone]  If 7PM-7AM, please contact night-coverage [email or web address] Password [email or web address] 06/07/2012, 3:15 PM   LOS: 2 days   Consultants:  IR - for possible drainage of questionable abscess on CT abdomen pelvis  Procedures:  None  Antibiotics:  Vancomycin  Rocephin  HPI/Subjective: Still complains of abdominal pain.  Objective: Filed Vitals:   06/07/12 0022 06/07/12 0434 06/07/12 0715 06/07/12 1418  BP:  125/74  120/72  Pulse:  93  90    Temp: 102.9 F (39.4 C) 98.3 F (36.8 C) 100.3 F (37.9 C) 99.5 F (37.5 C)  TempSrc: Oral Oral Oral Oral  Resp:  18  18  Height:      Weight:      SpO2:  100%  100%   No intake or output data in the 24 hours ending 06/07/12 1515  Exam:   General:  Pt is alert, follows commands appropriately, not in acute distress  Cardiovascular: Regular rate and rhythm, S1/S2, no murmurs, no rubs, no gallops  Respiratory: Clear to auscultation bilaterally, no wheezing, no crackles, no rhonchi  Abdomen: Abdominal pain across mid abdomen without rebound tenderness or guarding, positive bowel sounds  Extremities: No edema, pulses DP and PT palpable bilaterally  Neuro: Grossly nonfocal  Data Reviewed: Basic Metabolic Panel:  Lab 06/07/12 6213 06/05/12 1818  NA 135 136  K 3.1* 2.9*  CL 103 102  CO2 23 23  GLUCOSE 88 102*  BUN 6 12  CREATININE 0.86 0.80  CALCIUM 8.0* 8.9   Liver Function Tests:  Lab 06/05/12 1818  AST 43*  ALT 52*  ALKPHOS 81  BILITOT 1.1  PROT 6.6  ALBUMIN 3.5    Lab 06/05/12 1818  LIPASE 9*  AMYLASE --   No results found for this basename: AMMONIA:5 in the last 168 hours CBC:  Lab 06/07/12 0406 06/05/12 1902  WBC 12.9* 21.2*  HGB 9.0* 10.4*  HCT 26.6* 30.3*  MCV 82.6 82.1  PLT 205 246     Recent Results (from the past 240 hour(s))  URINE CULTURE     Status: Normal   Collection Time   06/05/12  5:59 PM      Component Value Range  Status Comment   Specimen Description URINE, CLEAN CATCH   Final    Special Requests Normal   Final    Culture  Setup Time 06/06/2012 11:07   Final    Colony Count 20,OOO COLONIES/ML   Final    Culture     Final    Value: Multiple bacterial morphotypes present, none predominant. Suggest appropriate recollection if clinically indicated.   Report Status 06/07/2012 FINAL   Final      Studies: Dg Chest 2 View 06/05/2012  * IMPRESSION: Low lung volumes, otherwise no acute cardiopulmonary abnormality.   Original  Report Authenticated By: Harley Hallmark, M.D.    Ct Abdomen Pelvis W Contrast 06/06/2012  *  IMPRESSION: Heterogeneous renal enhancement suggests ascending infection/pyelonephritis. Largest area involving the interpolar/upper pole left kidney measures 3 cm, suspicious for developing abscess.  No loculated fluid collection at this time.      Scheduled Meds:   . acetaminophen  650 mg Oral Once  . cefTRIAXone (ROCEPHIN)  IV  1 g Intravenous Q24H  . enoxaparin (LOVENOX) injection  40 mg Subcutaneous Q24H  . feeding supplement  1 Container Oral TID BM  . potassium chloride  10 mEq Intravenous Q1 Hr x 3  . potassium chloride  10 mEq Intravenous Q1 Hr x 3  . vancomycin  1,000 mg Intravenous Q8H   Continuous Infusions:   . sodium chloride

## 2012-06-07 NOTE — Progress Notes (Signed)
ANTIBIOTIC CONSULT NOTE - FOLLOW UP  Pharmacy Consult for Vancomycin  Indication: pyelonephritis/dental abscess  No Known Allergies  Patient Measurements: Height: 5\' 6"  (167.6 cm) Weight: 172 lb 2.9 oz (78.1 kg) IBW/kg (Calculated) : 59.3    Vital Signs: Temp: 98.3 F (36.8 C) (08/26 1958) Temp src: Tympanic (08/26 1958) BP: 111/67 mmHg (08/26 1958) Pulse Rate: 99  (08/26 1958) Intake/Output from previous day: 08/25 0701 - 08/26 0700 In: -  Out: 150 [Urine:150] Intake/Output from this shift: Total I/O In: 120 [P.O.:120] Out: 700 [Urine:700]  Labs:  Basename 06/07/12 0406 06/05/12 1902 06/05/12 1818  WBC 12.9* 21.2* --  HGB 9.0* 10.4* --  PLT 205 246 --  LABCREA -- -- --  CREATININE 0.86 -- 0.80   Estimated Creatinine Clearance: 106.4 ml/min (by C-G formula based on Cr of 0.86).  Basename 06/07/12 2112  VANCOTROUGH 6.9*  VANCOPEAK --  Drue Dun --  GENTTROUGH --  GENTPEAK --  GENTRANDOM --  TOBRATROUGH --  TOBRAPEAK --  TOBRARND --  AMIKACINPEAK --  AMIKACINTROU --  AMIKACIN --     Microbiology: Recent Results (from the past 720 hour(s))  URINE CULTURE     Status: Normal   Collection Time   06/05/12  5:59 PM      Component Value Range Status Comment   Specimen Description URINE, CLEAN CATCH   Final    Special Requests Normal   Final    Culture  Setup Time 06/06/2012 11:07   Final    Colony Count 20,OOO COLONIES/ML   Final    Culture     Final    Value: Multiple bacterial morphotypes present, none predominant. Suggest appropriate recollection if clinically indicated.   Report Status 06/07/2012 FINAL   Final     Anti-infectives     Start     Dose/Rate Route Frequency Ordered Stop   06/08/12 0300   vancomycin (VANCOCIN) 1,250 mg in sodium chloride 0.9 % 250 mL IVPB        1,250 mg 166.7 mL/hr over 90 Minutes Intravenous Every 8 hours 06/07/12 2225     06/07/12 0800   cefTRIAXone (ROCEPHIN) 1 g in dextrose 5 % 50 mL IVPB        1 g 100 mL/hr  over 30 Minutes Intravenous Every 24 hours 06/06/12 0847     06/06/12 2100   cefTRIAXone (ROCEPHIN) 1 g in dextrose 5 % 50 mL IVPB  Status:  Discontinued        1 g 100 mL/hr over 30 Minutes Intravenous Every 24 hours 06/06/12 0713 06/06/12 0847   06/06/12 0900   vancomycin (VANCOCIN) IVPB 1000 mg/200 mL premix  Status:  Discontinued        1,000 mg 200 mL/hr over 60 Minutes Intravenous Every 8 hours 06/06/12 0759 06/07/12 2225   06/06/12 0300   cefTRIAXone (ROCEPHIN) 1 g in dextrose 5 % 50 mL IVPB        1 g 100 mL/hr over 30 Minutes Intravenous  Once 06/06/12 0250 06/06/12 0341   06/06/12 0145   penicillin v potassium (VEETID) tablet 500 mg        500 mg Oral  Once 06/06/12 0136 06/06/12 0207   06/06/12 0000   penicillin v potassium (VEETID) 500 MG tablet        500 mg Oral 4 times daily 06/06/12 2536 06/13/12 2359          Assessment: 26 yo F on D2 vancomycin for dental abscess/pyelonephritis.  Vancomycin trough at  [facility] today was low (6.9).  Will increase Vancomycin dose and repeat trough  Goal of Therapy:  Vancomycin trough level 15-20 mcg/ml  Plan:  1.) Increase Vancomycin to 1250 mg IV q8h, will give next dose earlier than scheduled given low trough.  2.) Repeat Vancomycin trough at [facility] 3.) Monitor renal function   Clydene Fake PharmD Pager #: [phone] 10:28 PM 06/07/2012

## 2012-06-08 DIAGNOSIS — E876 Hypokalemia: Secondary | ICD-10-CM

## 2012-06-08 LAB — BASIC METABOLIC PANEL
CO2: 23 mEq/L (ref 19–32)
Calcium: 8.1 mg/dL — ABNORMAL LOW (ref 8.4–10.5)
Chloride: 103 mEq/L (ref 96–112)
Creatinine, Ser: 0.71 mg/dL (ref 0.50–1.10)
Glucose, Bld: 81 mg/dL (ref 70–99)
Sodium: 134 mEq/L — ABNORMAL LOW (ref 135–145)

## 2012-06-08 LAB — CBC
HCT: 26.1 % — ABNORMAL LOW (ref 36.0–46.0)
MCH: 27.8 pg (ref 26.0–34.0)
MCV: 82.3 fL (ref 78.0–100.0)
Platelets: 241 10*3/uL (ref 150–400)
RBC: 3.17 MIL/uL — ABNORMAL LOW (ref 3.87–5.11)
WBC: 7 10*3/uL (ref 4.0–10.5)

## 2012-06-08 MED ORDER — POTASSIUM CHLORIDE 10 MEQ/100ML IV SOLN
10.0000 meq | INTRAVENOUS | Status: AC
  Administered 2012-06-08 (×3): 10 meq via INTRAVENOUS
  Filled 2012-06-08 (×3): qty 100

## 2012-06-08 NOTE — Progress Notes (Signed)
[facility] PROGRESS NOTE  Karen Warren BJY:[id number] DOB: 04-05-1986 DOA: 06/05/2012 PCP: Sheila Oats, MD  Brief narrative: 26 year old female who presented with persistent abdominal pain, fevers. Patient was found to have questionable abscess on CT abdomen pelvis and likely pyelonephritis.   Assessment/Plan:   Principal Problem:  *Abdominal pain with nausea  - Perhaps secondary to pyelonephritis but we cannot exclude STD (GC/Chlamydia), bacterial vaginosis  - There was a questionable abscess on CT abdomen pelvis, I spoke with interventional radiology and their recommendation was to perhaps repeat scan if persistent fever or abdominal pain  - For now we'll continue Rocephin  - Follow up urine culture results - was not good specimen due to multiple bacterial morphologies present - Followup GC/Chlamydia and wet prep results   Active Problems:   Hypokalemia  - Repleted  - Magnesium level was 1.3 yesterday and as repleted as well - magnesium is WNL today  Leukocytosis  - Secondary to pyelonephritis versus GC/Chlamydia or bacterial vaginosis  - Management as above   Anemia  - Based on previous chart review baseline hemoglobin appears to be around 11  - Admission hemoglobin about 10  - stop lovenox; use SCD's - No signs of active bleed  - Followup CBC in the morning   Code Status: Full code  Family Communication: family updated at bedside  Disposition Plan: Home when stable   Consultants:  IR - for possible drainage of questionable abscess on CT abdomen pelvis; recommendation as to repeat CT abd if persistent fevers or abdominal pain Procedures:  None Antibiotics:  Vancomycin 06/05/2012 --> 06/08/2012  Rocephin 06/06/2012 -->  Manson Passey, MD  [facility] Pager [phone]  If 7PM-7AM, please contact night-coverage [email or web address] Password [facility] 06/08/2012, 2:49 PM   LOS: 3 days   HPI/Subjective: No acute events  overnight.  Objective: Filed Vitals:   06/07/12 1958 06/08/12 0530 06/08/12 0957 06/08/12 1328  BP: 111/67 115/73 127/75 123/82  Pulse: 99 88 87 99  Temp: 98.3 F (36.8 C) 99.3 F (37.4 C) 97.7 F (36.5 C) 98.5 F (36.9 C)  TempSrc: Tympanic Oral Oral Oral  Resp: 18 18 18 18   SpO2: 100% 100% 100% 100%    Intake/Output Summary (Last 24 hours) at 06/08/12 1449 Last data filed at 06/08/12 0900  Gross per 24 hour  Intake    360 ml  Output    700 ml  Net   -340 ml    Exam:   General:  Pt is alert, follows commands appropriately, not in acute distress  Cardiovascular: Regular rate and rhythm, S1/S2, no murmurs, no rubs, no gallops  Respiratory: Clear to auscultation bilaterally, no wheezing, no crackles, no rhonchi  Abdomen: Soft, tenderness in lower abdomen with no rebound tenderness or guarding, non distended, bowel sounds present  Extremities: No edema, pulses DP and PT palpable bilaterally  Neuro: Grossly nonfocal  Data Reviewed: Basic Metabolic Panel:  Lab 06/08/12 6962 06/07/12 0406 06/06/12 0756 06/05/12 1818  NA 134* 135 -- 136  K 3.3* 3.1* -- 2.9*  CL 103 103 -- 102  CO2 23 23 -- 23  GLUCOSE 81 88 -- 102*  BUN 4* 6 -- 12  CREATININE 0.71 0.86 -- 0.80  CALCIUM 8.1* 8.0* -- 8.9  MG 2.3 -- 1.3* --    Lab 06/05/12 1818  LIPASE 9*   CBC:  Lab 06/08/12 0340 06/07/12 0406 06/05/12 1902  WBC 7.0 12.9* 21.2*  HGB 8.8* 9.0* 10.4*  HCT 26.1* 26.6* 30.3*  MCV 82.3 82.6 82.1  PLT 241 205 246    URINE CULTURE     Status: Normal   Collection Time   06/05/12  5:59 PM      Component Value Range Status Comment   Specimen Description URINE  Final    Value: Multiple bacterial morphotypes present, none predominant. Suggest appropriate recollection if clinically indicated.   Report Status 06/07/2012 FINAL   Final   CULTURE, BLOOD (ROUTINE X 2)     Status: Normal (Preliminary result)   Collection Time   06/06/12  7:50 PM      Component Value Range Status  Comment   Culture     Final    Value:        BLOOD CULTURE RECEIVED NO GROWTH TO DATE    Report Status PENDING   Incomplete   CULTURE, BLOOD (ROUTINE X 2)     Status: Normal (Preliminary result)   Collection Time   06/06/12  7:56 PM      Component Value Range Status Comment   Culture     Final    Value:        BLOOD CULTURE RECEIVED NO GROWTH TO DATE    Report Status PENDING   Incomplete      Studies: No results found.  Scheduled Meds:  . cefTRIAXone  1 g Intravenous Q24H  . enoxaparin (LOVENOX)   40 mg Subcutaneous Q24H  . feeding supplement  1 Container Oral TID BM  . potassium chloride  10 mEq Intravenous Q1 Hr x 3  . vancomycin  1,250 mg Intravenous Q8H   Continuous Infusions:   . sodium chloride 125 mL/hr at 06/08/12 [phone]

## 2012-06-09 ENCOUNTER — Inpatient Hospital Stay (HOSPITAL_COMMUNITY): Payer: MEDICAID

## 2012-06-09 DIAGNOSIS — K089 Disorder of teeth and supporting structures, unspecified: Secondary | ICD-10-CM

## 2012-06-09 DIAGNOSIS — R197 Diarrhea, unspecified: Secondary | ICD-10-CM | POA: Clinically undetermined

## 2012-06-09 LAB — CBC WITH DIFFERENTIAL/PLATELET
Basophils Absolute: 0 10*3/uL (ref 0.0–0.1)
Basophils Relative: 1 % (ref 0–1)
HCT: 24.2 % — ABNORMAL LOW (ref 36.0–46.0)
MCHC: 35.1 g/dL (ref 30.0–36.0)
Monocytes Absolute: 0.8 10*3/uL (ref 0.1–1.0)
Neutro Abs: 3.9 10*3/uL (ref 1.7–7.7)
Neutrophils Relative %: 61 % (ref 43–77)
RDW: 13.6 % (ref 11.5–15.5)

## 2012-06-09 LAB — BASIC METABOLIC PANEL
Calcium: 8.3 mg/dL — ABNORMAL LOW (ref 8.4–10.5)
Creatinine, Ser: 0.63 mg/dL (ref 0.50–1.10)
GFR calc Af Amer: >90 mL/min (ref >90)

## 2012-06-09 LAB — MAGNESIUM: Magnesium: 1.8 mg/dL (ref 1.5–2.5)

## 2012-06-09 LAB — VANCOMYCIN, TROUGH: Vancomycin Tr: <5 ug/mL — ABNORMAL LOW (ref 10.0–20.0)

## 2012-06-09 MED ORDER — POTASSIUM CHLORIDE 10 MEQ/100ML IV SOLN: 10.0000 meq | INTRAVENOUS | Status: DC

## 2012-06-09 MED ORDER — SODIUM CHLORIDE 0.9 % IV BOLUS (SEPSIS)
1000.0000 mL | Freq: Once | INTRAVENOUS | Status: AC
  Administered 2012-06-09: 1000 mL via INTRAVENOUS

## 2012-06-09 MED ORDER — POTASSIUM CHLORIDE IN NACL 40-0.9 MEQ/L-% IV SOLN
INTRAVENOUS | Status: DC
  Administered 2012-06-09 – 2012-06-10 (×4): via INTRAVENOUS
  Filled 2012-06-09 (×4): qty 1000

## 2012-06-09 MED ORDER — ONDANSETRON 8 MG/NS 50 ML IVPB
8.0000 mg | Freq: Three times a day (TID) | INTRAVENOUS | Status: DC
  Administered 2012-06-09 – 2012-06-13 (×12): 8 mg via INTRAVENOUS
  Filled 2012-06-09 (×14): qty 8

## 2012-06-09 MED ORDER — POTASSIUM CHLORIDE CRYS ER 20 MEQ PO TBCR
40.0000 meq | EXTENDED_RELEASE_TABLET | ORAL | Status: AC
  Administered 2012-06-09 (×2): 40 meq via ORAL
  Filled 2012-06-09 (×2): qty 2

## 2012-06-09 MED ORDER — PROCHLORPERAZINE EDISYLATE 5 MG/ML IJ SOLN
10.0000 mg | Freq: Four times a day (QID) | INTRAMUSCULAR | Status: DC | PRN
  Administered 2012-06-09 – 2012-06-14 (×7): 10 mg via INTRAVENOUS
  Filled 2012-06-09 (×8): qty 2

## 2012-06-09 MED ORDER — KETOROLAC TROMETHAMINE 30 MG/ML IJ SOLN
30.0000 mg | Freq: Four times a day (QID) | INTRAMUSCULAR | Status: AC | PRN
  Administered 2012-06-09: 30 mg via INTRAVENOUS
  Filled 2012-06-09 (×2): qty 1

## 2012-06-09 MED ORDER — ONDANSETRON HCL 4 MG/2ML IJ SOLN: 8.0000 mg | Freq: Three times a day (TID) | INTRAMUSCULAR | Status: DC

## 2012-06-09 NOTE — Progress Notes (Signed)
Pt placed on contact precautions to r/o cdiff

## 2012-06-09 NOTE — Progress Notes (Signed)
[facility] PROGRESS NOTE  Karen Warren ZOX:[id number] DOB: Apr 08, 1986 DOA: 06/05/2012 PCP: Sheila Oats, MD  Assessment/Plan: Principal Problem:  *Pyelonephritis Active Problems:  Kidney, perinephric abscess  Abdominal pain  Pain, dental  Fever  Hypokalemia  Leukocytosis  Anemia  Diarrhea  #1 probable acute pyelonephritis Patient had presented with fever, urinalysis consistent with a UTI, left-sided CVA tenderness. CT scan of the abdomen and pelvis consistent with a pyelonephritis and questionable abscess. Patient has had some improvement in his symptoms however remains nauseous with left CVA tenderness.urine cultures are negative. Patient however with some clinical improvement. Will continue with current antibiotic treatment through IV, supportive care, IV fluids, antibiotics. May consider repeat CT scan of the abdomen and pelvis.  #2 dental pain We'll get a Panorex. Pain management. We'll need to followup with her dentist outpatient.  #3 hypokalemia Replete.  #4 diarrhea Check a C. Difficile PCR. Supportive care.  #5anemia H&H is stable. No active signs of GI bleed. Follow H&H.  #6 leukocytosis Likely secondary to problem #1. Improved. Continue current antibiotic treatment.  #7 prophylaxis SCDs for DVT prophylaxis.  Code Status: full Family Communication: updated patient and family at bedside Disposition Plan: home when medically stable   Brief narrative: 26 year old female who presented with persistent abdominal pain, fevers. Patient was found to have questionable abscess on CT abdomen pelvis and likely pyelonephritis   Consultants:  none  Procedures:  CT of the abdomen and pelvis  Antibiotics:  Rocephin 06/07/2012  HPI/Subjective: Patient states still with left sided back pain. Patient with nausea. Patient states only able to tolerate clear liquids. Patient however states that the CVA tenderness is improved from admission. Patient also  complaining of diarrhea.patient also complaining of tooth pain.  Objective: Filed Vitals:   06/08/12 1328 06/08/12 2147 06/09/12 0554 06/09/12 1427  BP: 123/82 129/69 131/79 124/73  Pulse: 99 103 83 97  Temp: 98.5 F (36.9 C) 98.6 F (37 C) 98.4 F (36.9 C) 99.1 F (37.3 C)  TempSrc: Oral Oral Oral Oral  Resp: 18 18 16 20   Height:      Weight:      SpO2: 100% 100% 100% 100%    Intake/Output Summary (Last 24 hours) at 06/09/12 1925 Last data filed at 06/09/12 1803  Gross per 24 hour  Intake 3144.58 ml  Output      0 ml  Net 3144.58 ml   Filed Weights   06/06/12 0748 06/06/12 0826  Weight: 77.792 kg (171 lb 8 oz) 78.1 kg (172 lb 2.9 oz)    Exam:   General:  NAD  Cardiovascular: regular rate and rhythm no murmurs rubs or gallops  Respiratory: clear to auscultation bilaterally. No wheezes no crackles no rhonchi  Abdomen: soft, nondistended, nontender. Positive bowel sounds. Patient with left-sided CVA tenderness.  Data Reviewed: Basic Metabolic Panel:  Lab 06/09/12 8119 06/08/12 0340 06/07/12 0406 06/06/12 0756 06/05/12 1818  NA 136 134* 135 -- 136  K 3.2* 3.3* 3.1* -- 2.9*  CL 104 103 103 -- 102  CO2 22 23 23  -- 23  GLUCOSE 94 81 88 -- 102*  BUN 4* 4* 6 -- 12  CREATININE 0.63 0.71 0.86 -- 0.80  CALCIUM 8.3* 8.1* 8.0* -- 8.9  MG 1.8 2.3 -- 1.3* --  PHOS -- -- -- -- --   Liver Function Tests:  Lab 06/05/12 1818  AST 43*  ALT 52*  ALKPHOS 81  BILITOT 1.1  PROT 6.6  ALBUMIN 3.5    Lab 06/05/12 1818  LIPASE  9*  AMYLASE --   No results found for this basename: AMMONIA:5 in the last 168 hours CBC:  Lab 06/09/12 1035 06/08/12 0340 06/07/12 0406 06/05/12 1902  WBC 6.4 7.0 12.9* 21.2*  NEUTROABS 3.9 -- -- 18.2*  HGB 8.5* 8.8* 9.0* 10.4*  HCT 24.2* 26.1* 26.6* 30.3*  MCV 80.9 82.3 82.6 82.1  PLT 272 241 205 246   Cardiac Enzymes: No results found for this basename: CKTOTAL:5,CKMB:5,CKMBINDEX:5,TROPONINI:5 in the last 168 hours BNP (last 3  results) No results found for this basename: PROBNP:3 in the last 8760 hours CBG: No results found for this basename: GLUCAP:5 in the last 168 hours  Recent Results (from the past 240 hour(s))  URINE CULTURE     Status: Normal   Collection Time   06/05/12  5:59 PM      Component Value Range Status Comment   Specimen Description URINE, CLEAN CATCH   Final    Special Requests Normal   Final    Culture  Setup Time 06/06/2012 11:07   Final    Colony Count 20,OOO COLONIES/ML   Final    Culture     Final    Value: Multiple bacterial morphotypes present, none predominant. Suggest appropriate recollection if clinically indicated.   Report Status 06/07/2012 FINAL   Final   CULTURE, BLOOD (ROUTINE X 2)     Status: Normal (Preliminary result)   Collection Time   06/06/12  7:50 PM      Component Value Range Status Comment   Specimen Description BLOOD LEFT ARM   Final    Special Requests BOTTLES DRAWN AEROBIC AND ANAEROBIC 10CC EACH   Final    Culture  Setup Time 06/07/2012 03:16   Final    Culture     Final    Value:        BLOOD CULTURE RECEIVED NO GROWTH TO DATE CULTURE WILL BE HELD FOR 5 DAYS BEFORE ISSUING A FINAL NEGATIVE REPORT   Report Status PENDING   Incomplete   CULTURE, BLOOD (ROUTINE X 2)     Status: Normal (Preliminary result)   Collection Time   06/06/12  7:56 PM      Component Value Range Status Comment   Specimen Description BLOOD RIGHT ARM   Final    Special Requests BOTTLES DRAWN AEROBIC AND ANAEROBIC 10CC EACH   Final    Culture  Setup Time 06/07/2012 03:16   Final    Culture     Final    Value:        BLOOD CULTURE RECEIVED NO GROWTH TO DATE CULTURE WILL BE HELD FOR 5 DAYS BEFORE ISSUING A FINAL NEGATIVE REPORT   Report Status PENDING   Incomplete      Studies: Dg Chest 2 View  06/05/2012  *RADIOLOGY REPORT*  Clinical Data: 26 year old female with headache dizziness difficulty breathing pain.  CHEST - 2 VIEW  Comparison: 01/12/2011.  Findings: Lower lung volumes.  Mild  increased interstitial markings are felt to reflect crowding.  Cardiac size and mediastinal contours are within normal limits.  Visualized tracheal air column is within normal limits.  Hair artifact at the thoracic inlet.  No pneumothorax, pulmonary edema or pleural effusion.  No consolidation.  Right upper quadrant surgical clips. No acute osseous abnormality identified.  IMPRESSION: Low lung volumes, otherwise no acute cardiopulmonary abnormality.   Original Report Authenticated By: Harley Hallmark, M.D.    Ct Abdomen Pelvis W Contrast  06/06/2012  *RADIOLOGY REPORT*  Clinical Data: Left lower quadrant  pain.  CT ABDOMEN AND PELVIS WITH CONTRAST  Technique:  Multidetector CT imaging of the abdomen and pelvis was performed following the standard protocol during bolus administration of intravenous contrast.  Contrast: OMNIPAQUE IOHEXOL 300 MG/ML  SOLN  Comparison: 01/04/2011  Findings: Mild dependent atelectasis or scarring.  Suboptimal contrast bolus timing.  Allowing for this, no focal abnormality identified within the liver, spleen, pancreas, or adrenal glands.  Absent gallbladder.  No biliary ductal dilatation.  Heterogeneous attenuation of the kidneys bilaterally with wedge- shaped areas of hypoattenuation.  More confluent ill-defined hypoattenuation within the interpolar - upper pole left kidney anteriorly measures 3 cm.  Mild perinephric fat stranding.  No hydronephrosis or hydroureter.  No bowel obstruction.  No CT evidence for colitis.  Normal appendix.  No free intraperitoneal air.  Small amount of free fluid within the pelvis.  Thin-walled bladder.  Adnexal cyst left greater than right, likely physiologic.  Normal caliber vasculature.  No acute osseous finding.  IMPRESSION: Heterogeneous renal enhancement suggests ascending infection/pyelonephritis. Largest area involving the interpolar/upper pole left kidney measures 3 cm, suspicious for developing abscess.  No loculated fluid collection at this  time.   Original Report Authenticated By: Waneta Martins, M.D.     Scheduled Meds:    . cefTRIAXone (ROCEPHIN)  IV  1 g Intravenous Q24H  . feeding supplement  1 Container Oral TID BM  . ondansetron  8 mg Intravenous Q8H  . potassium chloride  10 mEq Intravenous Q1 Hr x 3  . potassium chloride  40 mEq Oral Q4H  . sodium chloride  1,000 mL Intravenous Once  . DISCONTD: ondansetron  8 mg Intravenous Q8H  . DISCONTD: potassium chloride  10 mEq Intravenous Q1 Hr x 4   Continuous Infusions:    . 0.9 % NaCl with KCl 40 mEq / L 125 mL/hr at 06/09/12 1801  . DISCONTD: sodium chloride 1,000 mL (06/09/12 0100)    Principal Problem:  *Pyelonephritis Active Problems:  Kidney, perinephric abscess  Abdominal pain  Pain, dental  Fever  Hypokalemia  Leukocytosis  Anemia  Diarrhea    Time spent: > 30 mins    [facility]  [facility] Pager [phone]. If 8PM-8AM, please contact night-coverage at [email or web address], password [facility] 06/09/2012, 7:25 PM  LOS: 4 days

## 2012-06-09 NOTE — Progress Notes (Signed)
ANTIBIOTIC CONSULT NOTE - FOLLOW UP  Pharmacy Consult for Vancomycin  Indication: pyelonephritis/dental abscess  No Known Allergies  Patient Measurements: Height: 5\' 6"  (167.6 cm) Weight: 172 lb 2.9 oz (78.1 kg) IBW/kg (Calculated) : 59.3    Vital Signs: Temp: 98.4 F (36.9 C) (08/28 0554) Temp src: Oral (08/28 0554) BP: 131/79 mmHg (08/28 0554) Pulse Rate: 83  (08/28 0554) Intake/Output from previous day: 08/27 0701 - 08/28 0700 In: 1795 [P.O.:240; I.V.:1255; IV Piggyback:300] Out: -  Intake/Output from this shift:    Labs:  Basename 06/09/12 1035 06/08/12 0340 06/07/12 0406  WBC 6.4 7.0 12.9*  HGB 8.5* 8.8* 9.0*  PLT 272 241 205  LABCREA -- -- --  CREATININE 0.63 0.71 0.86   Estimated Creatinine Clearance: 114.3 ml/min (by C-G formula based on Cr of 0.63).  Basename 06/09/12 1035 06/07/12 2112  VANCOTROUGH <5.0* 6.9*  VANCOPEAK -- --  Drue Dun -- --  GENTTROUGH -- --  GENTPEAK -- --  GENTRANDOM -- --  TOBRATROUGH -- --  TOBRAPEAK -- --  TOBRARND -- --  AMIKACINPEAK -- --  AMIKACINTROU -- --  AMIKACIN -- --     Microbiology: Recent Results (from the past 720 hour(s))  URINE CULTURE     Status: Normal   Collection Time   06/05/12  5:59 PM      Component Value Range Status Comment   Specimen Description URINE, CLEAN CATCH   Final    Special Requests Normal   Final    Culture  Setup Time 06/06/2012 11:07   Final    Colony Count 20,OOO COLONIES/ML   Final    Culture     Final    Value: Multiple bacterial morphotypes present, none predominant. Suggest appropriate recollection if clinically indicated.   Report Status 06/07/2012 FINAL   Final   CULTURE, BLOOD (ROUTINE X 2)     Status: Normal (Preliminary result)   Collection Time   06/06/12  7:50 PM      Component Value Range Status Comment   Specimen Description BLOOD LEFT ARM   Final    Special Requests BOTTLES DRAWN AEROBIC AND ANAEROBIC 10CC EACH   Final    Culture  Setup Time 06/07/2012 03:16    Final    Culture     Final    Value:        BLOOD CULTURE RECEIVED NO GROWTH TO DATE CULTURE WILL BE HELD FOR 5 DAYS BEFORE ISSUING A FINAL NEGATIVE REPORT   Report Status PENDING   Incomplete   CULTURE, BLOOD (ROUTINE X 2)     Status: Normal (Preliminary result)   Collection Time   06/06/12  7:56 PM      Component Value Range Status Comment   Specimen Description BLOOD RIGHT ARM   Final    Special Requests BOTTLES DRAWN AEROBIC AND ANAEROBIC 10CC EACH   Final    Culture  Setup Time 06/07/2012 03:16   Final    Culture     Final    Value:        BLOOD CULTURE RECEIVED NO GROWTH TO DATE CULTURE WILL BE HELD FOR 5 DAYS BEFORE ISSUING A FINAL NEGATIVE REPORT   Report Status PENDING   Incomplete     Anti-infectives     Start     Dose/Rate Route Frequency Ordered Stop   06/08/12 0400   vancomycin (VANCOCIN) 1,250 mg in sodium chloride 0.9 % 250 mL IVPB  Status:  Discontinued        1,250  mg 166.7 mL/hr over 90 Minutes Intravenous Every 8 hours 06/07/12 2225 06/08/12 1456   06/07/12 0800   cefTRIAXone (ROCEPHIN) 1 g in dextrose 5 % 50 mL IVPB        1 g 100 mL/hr over 30 Minutes Intravenous Every 24 hours 06/06/12 0847     06/06/12 2100   cefTRIAXone (ROCEPHIN) 1 g in dextrose 5 % 50 mL IVPB  Status:  Discontinued        1 g 100 mL/hr over 30 Minutes Intravenous Every 24 hours 06/06/12 0713 06/06/12 0847   06/06/12 0900   vancomycin (VANCOCIN) IVPB 1000 mg/200 mL premix  Status:  Discontinued        1,000 mg 200 mL/hr over 60 Minutes Intravenous Every 8 hours 06/06/12 0759 06/07/12 2225   06/06/12 0300   cefTRIAXone (ROCEPHIN) 1 g in dextrose 5 % 50 mL IVPB        1 g 100 mL/hr over 30 Minutes Intravenous  Once 06/06/12 0250 06/06/12 0341   06/06/12 0145   penicillin v potassium (VEETID) tablet 500 mg        500 mg Oral  Once 06/06/12 0136 06/06/12 0207   06/06/12 0000   penicillin v potassium (VEETID) 500 MG tablet        500 mg Oral 4 times daily 06/06/12 0272 06/13/12 2359           Assessment: 26 yo F on D2 vancomycin for dental abscess/pyelonephritis.  Vancomycin has been d/c'd - disregard low trough this am  Goal of Therapy:  Vancomycin trough level 15-20 mcg/ml  Plan:  1.) Vanc D/c'd per MD 2) On rocephin 3) follow up cxs  Janace Litten PharmD Pager #: [phone] 12:42 PM 06/09/2012

## 2012-06-10 LAB — CBC
Hemoglobin: 8.9 g/dL — ABNORMAL LOW (ref 12.0–15.0)
MCHC: 34.8 g/dL (ref 30.0–36.0)
Platelets: 307 10*3/uL (ref 150–400)
RDW: 13.6 % (ref 11.5–15.5)

## 2012-06-10 LAB — BASIC METABOLIC PANEL
BUN: 3 mg/dL — ABNORMAL LOW (ref 6–23)
Calcium: 8.6 mg/dL (ref 8.4–10.5)
GFR calc Af Amer: >90 mL/min (ref >90)
GFR calc non Af Amer: >90 mL/min (ref >90)
Potassium: 4.1 mEq/L (ref 3.5–5.1)

## 2012-06-10 LAB — MAGNESIUM: Magnesium: 1.7 mg/dL (ref 1.5–2.5)

## 2012-06-10 MED ORDER — SODIUM CHLORIDE 0.9 % IV SOLN
INTRAVENOUS | Status: DC
  Administered 2012-06-10 – 2012-06-12 (×6): via INTRAVENOUS
  Administered 2012-06-12: 1000 mL via INTRAVENOUS

## 2012-06-10 MED ORDER — LOPERAMIDE HCL 2 MG PO CAPS
4.0000 mg | ORAL_CAPSULE | Freq: Once | ORAL | Status: AC
  Administered 2012-06-10: 4 mg via ORAL
  Filled 2012-06-10: qty 2

## 2012-06-10 MED ORDER — MAGNESIUM SULFATE 40 MG/ML IJ SOLN
2.0000 g | Freq: Once | INTRAMUSCULAR | Status: AC
  Administered 2012-06-10: 2 g via INTRAVENOUS
  Filled 2012-06-10: qty 50

## 2012-06-10 MED ORDER — LOPERAMIDE HCL 2 MG PO CAPS: 2.0000 mg | ORAL_CAPSULE | ORAL | Status: DC | PRN

## 2012-06-10 NOTE — Progress Notes (Signed)
C Diff PCR NEGATIVE. Contact precautions discontinued per IC Protocol.Hartley Barefoot

## 2012-06-10 NOTE — Progress Notes (Signed)
[facility] PROGRESS NOTE  ETHELINE GEPPERT ZOX:[id number] DOB: 03/12/1986 DOA: 06/05/2012 PCP: Sheila Oats, MD  Assessment/Plan: Principal Problem:  *Pyelonephritis Active Problems:  Kidney, perinephric abscess  Abdominal pain  Pain, dental  Fever  Hypokalemia  Leukocytosis  Anemia  Diarrhea  #1 probable acute pyelonephritis/UTI Patient had presented with fever, urinalysis consistent with a UTI, left-sided CVA tenderness. CT scan of the abdomen and pelvis consistent with a pyelonephritis and questionable abscess. Patient has had some improvement in his symptoms however remains nauseous with left CVA tenderness.urine cultures are negative. Patient however with some clinical improvement. Will continue with current antibiotic treatment D4/14, through IV, supportive care, IV fluids. May consider repeat CT scan of the abdomen and pelvis if patient becomes febrile has a leukocytosis or worsening symptoms.  #2 dental pain/dental caries  Pain management. We'll need to followup with a dentist outpatient.  #3 hypokalemia Replete.  #4 diarrhea  C. Difficile PCR is negative. Supportive care. Imodium as needed  #5anemia H&H is stable. No active signs of GI bleed. Follow H&H.  #6 leukocytosis Likely secondary to problem #1. Improved. Continue current antibiotic treatment.  #7 prophylaxis SCDs for DVT prophylaxis.  Code Status: full Family Communication: updated patient. Disposition Plan: home when medically stable   Brief narrative: 26 year old female who presented with persistent abdominal pain, fevers. Patient was found to have questionable abscess on CT abdomen pelvis and likely pyelonephritis   Consultants:  none  Procedures:  CT of the abdomen and pelvis  Antibiotics:  Rocephin 06/07/2012  HPI/Subjective: Patient states still with left sided back pain. Patient with nausea. Patient states no further diarrhea. Patient states she's feeling better than on  admission.  Objective: Filed Vitals:   06/09/12 1427 06/09/12 2120 06/10/12 0650 06/10/12 1358  BP: 124/73 133/81 147/83 126/72  Pulse: 97 76 76 78  Temp: 99.1 F (37.3 C) 98 F (36.7 C) 98.7 F (37.1 C) 98.3 F (36.8 C)  TempSrc: Oral Oral Oral Oral  Resp: 20 18 18 18   Height:      Weight:      SpO2: 100% 97% 100% 99%    Intake/Output Summary (Last 24 hours) at 06/10/12 1537 Last data filed at 06/10/12 1448  Gross per 24 hour  Intake 2342.58 ml  Output      0 ml  Net 2342.58 ml   Filed Weights   06/06/12 0748 06/06/12 0826  Weight: 77.792 kg (171 lb 8 oz) 78.1 kg (172 lb 2.9 oz)    Exam:   General:  NAD  Cardiovascular: regular rate and rhythm no murmurs rubs or gallops  Respiratory: clear to auscultation bilaterally. No wheezes no crackles no rhonchi  Abdomen: soft, nondistended. Positive bowel sounds. Patient with left-sided CVA tenderness.  Data Reviewed: Basic Metabolic Panel:  Lab 06/10/12 8119 06/09/12 1035 06/08/12 0340 06/07/12 0406 06/06/12 0756 06/05/12 1818  NA 135 136 134* 135 -- 136  K 4.1 3.2* 3.3* 3.1* -- 2.9*  CL 105 104 103 103 -- 102  CO2 21 22 23 23  -- 23  GLUCOSE 76 94 81 88 -- 102*  BUN 3* 4* 4* 6 -- 12  CREATININE 0.63 0.63 0.71 0.86 -- 0.80  CALCIUM 8.6 8.3* 8.1* 8.0* -- 8.9  MG 1.7 1.8 2.3 -- 1.3* --  PHOS -- -- -- -- -- --   Liver Function Tests:  Lab 06/05/12 1818  AST 43*  ALT 52*  ALKPHOS 81  BILITOT 1.1  PROT 6.6  ALBUMIN 3.5    Lab 06/05/12  1818  LIPASE 9*  AMYLASE --   No results found for this basename: AMMONIA:5 in the last 168 hours CBC:  Lab 06/10/12 0909 06/09/12 1035 06/08/12 0340 06/07/12 0406 06/05/12 1902  WBC 7.4 6.4 7.0 12.9* 21.2*  NEUTROABS -- 3.9 -- -- 18.2*  HGB 8.9* 8.5* 8.8* 9.0* 10.4*  HCT 25.6* 24.2* 26.1* 26.6* 30.3*  MCV 81.3 80.9 82.3 82.6 82.1  PLT 307 272 241 205 246   Cardiac Enzymes: No results found for this basename: CKTOTAL:5,CKMB:5,CKMBINDEX:5,TROPONINI:5 in the last 168  hours BNP (last 3 results) No results found for this basename: PROBNP:3 in the last 8760 hours CBG: No results found for this basename: GLUCAP:5 in the last 168 hours  Recent Results (from the past 240 hour(s))  URINE CULTURE     Status: Normal   Collection Time   06/05/12  5:59 PM      Component Value Range Status Comment   Specimen Description URINE, CLEAN CATCH   Final    Special Requests Normal   Final    Culture  Setup Time 06/06/2012 11:07   Final    Colony Count 20,OOO COLONIES/ML   Final    Culture     Final    Value: Multiple bacterial morphotypes present, none predominant. Suggest appropriate recollection if clinically indicated.   Report Status 06/07/2012 FINAL   Final   CULTURE, BLOOD (ROUTINE X 2)     Status: Normal (Preliminary result)   Collection Time   06/06/12  7:50 PM      Component Value Range Status Comment   Specimen Description BLOOD LEFT ARM   Final    Special Requests BOTTLES DRAWN AEROBIC AND ANAEROBIC 10CC EACH   Final    Culture  Setup Time 06/07/2012 03:16   Final    Culture     Final    Value:        BLOOD CULTURE RECEIVED NO GROWTH TO DATE CULTURE WILL BE HELD FOR 5 DAYS BEFORE ISSUING A FINAL NEGATIVE REPORT   Report Status PENDING   Incomplete   CULTURE, BLOOD (ROUTINE X 2)     Status: Normal (Preliminary result)   Collection Time   06/06/12  7:56 PM      Component Value Range Status Comment   Specimen Description BLOOD RIGHT ARM   Final    Special Requests BOTTLES DRAWN AEROBIC AND ANAEROBIC 10CC EACH   Final    Culture  Setup Time 06/07/2012 03:16   Final    Culture     Final    Value:        BLOOD CULTURE RECEIVED NO GROWTH TO DATE CULTURE WILL BE HELD FOR 5 DAYS BEFORE ISSUING A FINAL NEGATIVE REPORT   Report Status PENDING   Incomplete   CLOSTRIDIUM DIFFICILE BY PCR     Status: Normal   Collection Time   06/09/12  5:40 PM      Component Value Range Status Comment   C difficile by pcr NEGATIVE  NEGATIVE Final      Studies: Dg Chest 2  View  06/05/2012  *RADIOLOGY REPORT*  Clinical Data: 26 year old female with headache dizziness difficulty breathing pain.  CHEST - 2 VIEW  Comparison: 01/12/2011.  Findings: Lower lung volumes.  Mild increased interstitial markings are felt to reflect crowding.  Cardiac size and mediastinal contours are within normal limits.  Visualized tracheal air column is within normal limits.  Hair artifact at the thoracic inlet.  No pneumothorax, pulmonary edema or pleural effusion.  No consolidation.  Right upper quadrant surgical clips. No acute osseous abnormality identified.  IMPRESSION: Low lung volumes, otherwise no acute cardiopulmonary abnormality.   Original Report Authenticated By: Harley Hallmark, M.D.    Ct Abdomen Pelvis W Contrast  06/06/2012  *RADIOLOGY REPORT*  Clinical Data: Left lower quadrant pain.  CT ABDOMEN AND PELVIS WITH CONTRAST  Technique:  Multidetector CT imaging of the abdomen and pelvis was performed following the standard protocol during bolus administration of intravenous contrast.  Contrast: OMNIPAQUE IOHEXOL 300 MG/ML  SOLN  Comparison: 01/04/2011  Findings: Mild dependent atelectasis or scarring.  Suboptimal contrast bolus timing.  Allowing for this, no focal abnormality identified within the liver, spleen, pancreas, or adrenal glands.  Absent gallbladder.  No biliary ductal dilatation.  Heterogeneous attenuation of the kidneys bilaterally with wedge- shaped areas of hypoattenuation.  More confluent ill-defined hypoattenuation within the interpolar - upper pole left kidney anteriorly measures 3 cm.  Mild perinephric fat stranding.  No hydronephrosis or hydroureter.  No bowel obstruction.  No CT evidence for colitis.  Normal appendix.  No free intraperitoneal air.  Small amount of free fluid within the pelvis.  Thin-walled bladder.  Adnexal cyst left greater than right, likely physiologic.  Normal caliber vasculature.  No acute osseous finding.  IMPRESSION: Heterogeneous renal  enhancement suggests ascending infection/pyelonephritis. Largest area involving the interpolar/upper pole left kidney measures 3 cm, suspicious for developing abscess.  No loculated fluid collection at this time.   Original Report Authenticated By: Waneta Martins, M.D.     Scheduled Meds:    . cefTRIAXone (ROCEPHIN)  IV  1 g Intravenous Q24H  . feeding supplement  1 Container Oral TID BM  . loperamide  4 mg Oral Once  . magnesium sulfate 1 - 4 g bolus IVPB  2 g Intravenous Once  . ondansetron  8 mg Intravenous Q8H  . potassium chloride  40 mEq Oral Q4H  . sodium chloride  1,000 mL Intravenous Once  . DISCONTD: ondansetron  8 mg Intravenous Q8H  . DISCONTD: potassium chloride  10 mEq Intravenous Q1 Hr x 4   Continuous Infusions:    . sodium chloride    . DISCONTD: sodium chloride 1,000 mL (06/09/12 0100)  . DISCONTD: 0.9 % NaCl with KCl 40 mEq / L 100 mL/hr at 06/10/12 1213    Principal Problem:  *Pyelonephritis Active Problems:  Kidney, perinephric abscess  Abdominal pain  Pain, dental  Fever  Hypokalemia  Leukocytosis  Anemia  Diarrhea    Time spent: > 30 mins    [facility]  [facility] Pager [phone]. If 8PM-8AM, please contact night-coverage at [email or web address], password [facility] 06/10/2012, 3:37 PM  LOS: 5 days

## 2012-06-11 LAB — BASIC METABOLIC PANEL
Chloride: 102 mEq/L (ref 96–112)
GFR calc Af Amer: >90 mL/min (ref >90)
GFR calc non Af Amer: >90 mL/min (ref >90)
Potassium: 4.1 mEq/L (ref 3.5–5.1)
Sodium: 136 mEq/L (ref 135–145)

## 2012-06-11 LAB — CBC
HCT: 27.9 % — ABNORMAL LOW (ref 36.0–46.0)
Platelets: 380 10*3/uL (ref 150–400)
RDW: 13.3 % (ref 11.5–15.5)
WBC: 8.3 10*3/uL (ref 4.0–10.5)

## 2012-06-11 LAB — MAGNESIUM: Magnesium: 2 mg/dL (ref 1.5–2.5)

## 2012-06-11 MED ORDER — ENSURE COMPLETE PO LIQD
237.0000 mL | Freq: Two times a day (BID) | ORAL | Status: DC
  Administered 2012-06-11 – 2012-06-15 (×6): 237 mL via ORAL

## 2012-06-11 MED ORDER — CIPROFLOXACIN HCL 500 MG PO TABS
500.0000 mg | ORAL_TABLET | Freq: Two times a day (BID) | ORAL | Status: DC
  Administered 2012-06-11 – 2012-06-15 (×8): 500 mg via ORAL
  Filled 2012-06-11 (×11): qty 1

## 2012-06-11 MED ORDER — OXYCODONE HCL 5 MG PO TABS
5.0000 mg | ORAL_TABLET | ORAL | Status: DC | PRN
  Administered 2012-06-11 – 2012-06-13 (×3): 10 mg via ORAL
  Administered 2012-06-13: 5 mg via ORAL
  Administered 2012-06-13 – 2012-06-14 (×5): 10 mg via ORAL
  Filled 2012-06-11 (×5): qty 2
  Filled 2012-06-11: qty 1
  Filled 2012-06-11 (×2): qty 2
  Filled 2012-06-11: qty 1
  Filled 2012-06-11 (×2): qty 2

## 2012-06-11 NOTE — Progress Notes (Signed)
Night RN reports that at beginning of shift she requested pt to save any emesis for RN to view. Pt reports this am that she had 1 episode of vomiting (not saved to show RN). I have requested same of pt. Husband in room at time & is aware that RN will need to see any emesis. Pt states that she did drink small amt of Ensure during the night & was able to keep that down.Karen Warren

## 2012-06-11 NOTE — Progress Notes (Signed)
A list of [facility] Providers  given to pt for PCP and  Dental Services, accepting Medicaid or Self Pay.  [facility], [facility] Department etc..mp

## 2012-06-11 NOTE — Progress Notes (Signed)
[facility] PROGRESS NOTE  Karen Warren WJX:[id number] DOB: [birth year] DOA: 06/05/2012 PCP: Sheila Oats, MD  Assessment/Plan: Principal Problem:  *Pyelonephritis Active Problems:  Kidney, perinephric abscess  Abdominal pain  Pain, dental  Fever  Hypokalemia  Leukocytosis  Anemia  Diarrhea  #1 probable acute pyelonephritis/UTI Patient had presented with fever, urinalysis consistent with a UTI, left-sided CVA tenderness. CT scan of the abdomen and pelvis consistent with a pyelonephritis and questionable abscess. Patient has had some improvement in his symptoms however remains nauseous with left CVA tenderness.urine cultures are negative. Patient however with some clinical improvement. Will continue with current antibiotic treatment D5/14, through IV, supportive care, IV fluids. May consider repeat CT scan of the abdomen and pelvis if patient becomes febrile has a leukocytosis or worsening symptoms.  #2 dental pain/dental caries  Pain management. Will need to followup with a dentist outpatient.  #3 hypokalemia Repleted.  #4 diarrhea  C. Difficile PCR is negative. Supportive care. Imodium as needed  #5anemia H&H is stable. No active signs of GI bleed. Follow H&H.  #6 leukocytosis Likely secondary to problem #1. Improved. Continue current antibiotic treatment.  #7 prophylaxis SCDs for DVT prophylaxis.  Code Status: full Family Communication: updated patient and husband at bedside. Disposition Plan: home when medically stable   Brief narrative: 26 year old female who presented with persistent abdominal pain, fevers. Patient was found to have questionable abscess on CT abdomen pelvis and likely pyelonephritis   Consultants:  none  Procedures:  CT of the abdomen and pelvis  Antibiotics:  Rocephin 06/07/2012  HPI/Subjective: Patient states still with left sided back pain. Patient with some improvement of nausea. Patient states no further diarrhea.  Patient states able to tolerate ensure.  Objective: Filed Vitals:   06/10/12 1358 06/10/12 2335 06/11/12 0632 06/11/12 1308  BP: 126/72 109/60 112/67 129/85  Pulse: 78 70 68 76  Temp: 98.3 F (36.8 C) 99.8 F (37.7 C) 99 F (37.2 C) 98.4 F (36.9 C)  TempSrc: Oral Oral Oral   Resp: 18 20 20 18   Height:      Weight:      SpO2: 99% 99% 99% 95%    Intake/Output Summary (Last 24 hours) at 06/11/12 1425 Last data filed at 06/11/12 1331  Gross per 24 hour  Intake   2705 ml  Output   1200 ml  Net   1505 ml   Filed Weights   06/06/12 0748 06/06/12 0826  Weight: 77.792 kg (171 lb 8 oz) 78.1 kg (172 lb 2.9 oz)    Exam:   General:  NAD  Cardiovascular: regular rate and rhythm no murmurs rubs or gallops  Respiratory: clear to auscultation bilaterally. No wheezes no crackles no rhonchi  Abdomen: soft, nondistended. Positive bowel sounds. Patient with left-sided CVA tenderness.  Data Reviewed: Basic Metabolic Panel:  Lab 06/11/12 2130 06/10/12 0909 06/09/12 1035 06/08/12 0340 06/07/12 0406 06/06/12 0756  NA 136 135 136 134* 135 --  K 4.1 4.1 3.2* 3.3* 3.1* --  CL 102 105 104 103 103 --  CO2 25 21 22 23 23  --  GLUCOSE 68* 76 94 81 88 --  BUN 4* 3* 4* 4* 6 --  CREATININE 0.74 0.63 0.63 0.71 0.86 --  CALCIUM 8.7 8.6 8.3* 8.1* 8.0* --  MG 2.0 1.7 1.8 2.3 -- 1.3*  PHOS -- -- -- -- -- --   Liver Function Tests:  Lab 06/05/12 1818  AST 43*  ALT 52*  ALKPHOS 81  BILITOT 1.1  PROT 6.6  ALBUMIN  3.5    Lab 06/05/12 1818  LIPASE 9*  AMYLASE --   No results found for this basename: AMMONIA:5 in the last 168 hours CBC:  Lab 06/11/12 0325 06/10/12 0909 06/09/12 1035 06/08/12 0340 06/07/12 0406 06/05/12 1902  WBC 8.3 7.4 6.4 7.0 12.9* --  NEUTROABS -- -- 3.9 -- -- 18.2*  HGB 9.6* 8.9* 8.5* 8.8* 9.0* --  HCT 27.9* 25.6* 24.2* 26.1* 26.6* --  MCV 81.1 81.3 80.9 82.3 82.6 --  PLT 380 307 272 241 205 --   Cardiac Enzymes: No results found for this basename:  CKTOTAL:5,CKMB:5,CKMBINDEX:5,TROPONINI:5 in the last 168 hours BNP (last 3 results) No results found for this basename: PROBNP:3 in the last 8760 hours CBG: No results found for this basename: GLUCAP:5 in the last 168 hours  Recent Results (from the past 240 hour(s))  URINE CULTURE     Status: Normal   Collection Time   06/05/12  5:59 PM      Component Value Range Status Comment   Specimen Description URINE, CLEAN CATCH   Final    Special Requests Normal   Final    Culture  Setup Time 06/06/2012 11:07   Final    Colony Count 20,OOO COLONIES/ML   Final    Culture     Final    Value: Multiple bacterial morphotypes present, none predominant. Suggest appropriate recollection if clinically indicated.   Report Status 06/07/2012 FINAL   Final   CULTURE, BLOOD (ROUTINE X 2)     Status: Normal (Preliminary result)   Collection Time   06/06/12  7:50 PM      Component Value Range Status Comment   Specimen Description BLOOD LEFT ARM   Final    Special Requests BOTTLES DRAWN AEROBIC AND ANAEROBIC 10CC EACH   Final    Culture  Setup Time 06/07/2012 03:16   Final    Culture     Final    Value:        BLOOD CULTURE RECEIVED NO GROWTH TO DATE CULTURE WILL BE HELD FOR 5 DAYS BEFORE ISSUING A FINAL NEGATIVE REPORT   Report Status PENDING   Incomplete   CULTURE, BLOOD (ROUTINE X 2)     Status: Normal (Preliminary result)   Collection Time   06/06/12  7:56 PM      Component Value Range Status Comment   Specimen Description BLOOD RIGHT ARM   Final    Special Requests BOTTLES DRAWN AEROBIC AND ANAEROBIC 10CC EACH   Final    Culture  Setup Time 06/07/2012 03:16   Final    Culture     Final    Value:        BLOOD CULTURE RECEIVED NO GROWTH TO DATE CULTURE WILL BE HELD FOR 5 DAYS BEFORE ISSUING A FINAL NEGATIVE REPORT   Report Status PENDING   Incomplete   CLOSTRIDIUM DIFFICILE BY PCR     Status: Normal   Collection Time   06/09/12  5:40 PM      Component Value Range Status Comment   C difficile by pcr  NEGATIVE  NEGATIVE Final      Studies: Dg Chest 2 View  06/05/2012  *RADIOLOGY REPORT*  Clinical Data: 26 year old female with headache dizziness difficulty breathing pain.  CHEST - 2 VIEW  Comparison: 01/12/2011.  Findings: Lower lung volumes.  Mild increased interstitial markings are felt to reflect crowding.  Cardiac size and mediastinal contours are within normal limits.  Visualized tracheal air column is within normal limits.  Hair artifact at the thoracic inlet.  No pneumothorax, pulmonary edema or pleural effusion.  No consolidation.  Right upper quadrant surgical clips. No acute osseous abnormality identified.  IMPRESSION: Low lung volumes, otherwise no acute cardiopulmonary abnormality.   Original Report Authenticated By: Harley Hallmark, M.D.    Ct Abdomen Pelvis W Contrast  06/06/2012  *RADIOLOGY REPORT*  Clinical Data: Left lower quadrant pain.  CT ABDOMEN AND PELVIS WITH CONTRAST  Technique:  Multidetector CT imaging of the abdomen and pelvis was performed following the standard protocol during bolus administration of intravenous contrast.  Contrast: OMNIPAQUE IOHEXOL 300 MG/ML  SOLN  Comparison: 01/04/2011  Findings: Mild dependent atelectasis or scarring.  Suboptimal contrast bolus timing.  Allowing for this, no focal abnormality identified within the liver, spleen, pancreas, or adrenal glands.  Absent gallbladder.  No biliary ductal dilatation.  Heterogeneous attenuation of the kidneys bilaterally with wedge- shaped areas of hypoattenuation.  More confluent ill-defined hypoattenuation within the interpolar - upper pole left kidney anteriorly measures 3 cm.  Mild perinephric fat stranding.  No hydronephrosis or hydroureter.  No bowel obstruction.  No CT evidence for colitis.  Normal appendix.  No free intraperitoneal air.  Small amount of free fluid within the pelvis.  Thin-walled bladder.  Adnexal cyst left greater than right, likely physiologic.  Normal caliber vasculature.  No acute  osseous finding.  IMPRESSION: Heterogeneous renal enhancement suggests ascending infection/pyelonephritis. Largest area involving the interpolar/upper pole left kidney measures 3 cm, suspicious for developing abscess.  No loculated fluid collection at this time.   Original Report Authenticated By: Waneta Martins, M.D.     Scheduled Meds:    . cefTRIAXone (ROCEPHIN)  IV  1 g Intravenous Q24H  . feeding supplement  237 mL Oral BID BM  . magnesium sulfate 1 - 4 g bolus IVPB  2 g Intravenous Once  . ondansetron  8 mg Intravenous Q8H  . DISCONTD: feeding supplement  1 Container Oral TID BM   Continuous Infusions:    . sodium chloride 100 mL/hr at 06/11/12 1334  . DISCONTD: 0.9 % NaCl with KCl 40 mEq / L 100 mL/hr at 06/10/12 1213    Principal Problem:  *Pyelonephritis Active Problems:  Kidney, perinephric abscess  Abdominal pain  Pain, dental  Fever  Hypokalemia  Leukocytosis  Anemia  Diarrhea    Time spent: > 30 mins    [facility]  [facility] Pager [phone]. If 8PM-8AM, please contact night-coverage at [email or web address], password [facility] 06/11/2012, 2:25 PM  LOS: 6 days

## 2012-06-11 NOTE — Progress Notes (Signed)
INITIAL ADULT NUTRITION ASSESSMENT Date: 06/11/2012   Time: 10:26 AM Reason for Assessment: NPO/clear liquids x 6 days  INTERVENTION: Diet advancement per MD. Anti-emetics per MD. Will monitor. If diet unable to be advanced after the weekend, recommend MD consider nutrition support.    ASSESSMENT: Female 26 y.o.  Dx: Pyelonephritis  Food/Nutrition Related Hx: Pt admitted with tooth abscess and reports starting 2 days PTA she has not been able to eat. Pt with ongoing nausea and abdominal pain during this hospitalization however states she hasn't vomited since admission. Pt reports she drank some Ensure yesterday that she was able to keep down but so far today she has only consumed a few sips of juice. Pt denies any weight loss recently.   Hx:  History reviewed. No pertinent past medical history.  Related Meds:  Scheduled Meds:   . cefTRIAXone (ROCEPHIN)  IV  1 g Intravenous Q24H  . feeding supplement  1 Container Oral TID BM  . loperamide  4 mg Oral Once  . magnesium sulfate 1 - 4 g bolus IVPB  2 g Intravenous Once  . ondansetron  8 mg Intravenous Q8H   Continuous Infusions:   . sodium chloride 100 mL/hr at 06/11/12 0410  . DISCONTD: 0.9 % NaCl with KCl 40 mEq / L 100 mL/hr at 06/10/12 1213   PRN Meds:.acetaminophen, acetaminophen, alum & mag hydroxide-simeth, HYDROmorphone (DILAUDID) injection, ketorolac, loperamide, oxyCODONE, prochlorperazine, zolpidem  Ht: 5\' 6"  (167.6 cm)  Wt: 172 lb 2.9 oz (78.1 kg)  Ideal Wt: 130 lb % Ideal Wt: 132  Usual Wt: 172 lb per pt report % Usual Wt: 100  Body mass index is 27.79 kg/(m^2).   Labs:  CMP     Component Value Date/Time   NA 136 06/11/2012 0325   K 4.1 06/11/2012 0325   CL 102 06/11/2012 0325   CO2 25 06/11/2012 0325   GLUCOSE 68* 06/11/2012 0325   BUN 4* 06/11/2012 0325   CREATININE 0.74 06/11/2012 0325   CALCIUM 8.7 06/11/2012 0325   PROT 6.6 06/05/2012 1818   ALBUMIN 3.5 06/05/2012 1818   AST 43* 06/05/2012 1818   ALT 52*  06/05/2012 1818   ALKPHOS 81 06/05/2012 1818   BILITOT 1.1 06/05/2012 1818   GFRNONAA >90 06/11/2012 0325   GFRAA >90 06/11/2012 0325    Intake/Output Summary (Last 24 hours) at 06/11/12 1030 Last data filed at 06/11/12 1610  Gross per 24 hour  Intake   1545 ml  Output    600 ml  Net    945 ml   Last BM -  8/29  Diet Order: Clear Liquid   IVF:    sodium chloride Last Rate: 100 mL/hr at 06/11/12 0410  DISCONTD: 0.9 % NaCl with KCl 40 mEq / L Last Rate: 100 mL/hr at 06/10/12 1213    Estimated Nutritional Needs:   Kcal:1600-1900 Protein:80-95g Fluid:1.6-1.9L  NUTRITION DIAGNOSIS: -Inadequate oral intake (NI-2.1).  Status: Ongoing  RELATED TO: nausea/abdominal pain  AS EVIDENCE BY: clear liquid diet  MONITORING/EVALUATION(Goals): Advance diet as tolerated to bland diet.   EDUCATION NEEDS: -Education needs addressed - briefly reviewed nutrition therapy for nausea.   Dietitian #: [phone]  DOCUMENTATION CODES Per approved criteria  -Not Applicable    Marshall Cork 06/11/2012, 10:26 AM

## 2012-06-12 ENCOUNTER — Inpatient Hospital Stay (HOSPITAL_COMMUNITY): Payer: MEDICAID

## 2012-06-12 DIAGNOSIS — E860 Dehydration: Secondary | ICD-10-CM

## 2012-06-12 LAB — BASIC METABOLIC PANEL
BUN: 4 mg/dL — ABNORMAL LOW (ref 6–23)
GFR calc Af Amer: >90 mL/min (ref >90)
GFR calc non Af Amer: >90 mL/min (ref >90)
Potassium: 3.6 mEq/L (ref 3.5–5.1)

## 2012-06-12 LAB — CBC
Hemoglobin: 10.1 g/dL — ABNORMAL LOW (ref 12.0–15.0)
MCHC: 33.8 g/dL (ref 30.0–36.0)
Platelets: 440 10*3/uL — ABNORMAL HIGH (ref 150–400)
RDW: 13.4 % (ref 11.5–15.5)

## 2012-06-12 MED ORDER — IOHEXOL 300 MG/ML  SOLN
100.0000 mL | Freq: Once | INTRAMUSCULAR | Status: AC | PRN
  Administered 2012-06-12: 100 mL via INTRAVENOUS

## 2012-06-12 NOTE — Progress Notes (Signed)
[facility] PROGRESS NOTE  Karen Warren RUE:[id number] DOB: 05-May-1986 DOA: 06/05/2012 PCP: Sheila Oats, MD  Assessment/Plan: Principal Problem:  *Pyelonephritis Active Problems:  Kidney, perinephric abscess  Abdominal pain  Pain, dental  Fever  Hypokalemia  Leukocytosis  Anemia  Diarrhea  #1 Acute bilateral pyelonephritis/UTI Patient had presented with fever, urinalysis consistent with a UTI, left-sided CVA tenderness. CT scan of the abdomen and pelvis consistent with a pyelonephritis and questionable abscess. Patient has had some improvement in his symptoms however remains nauseous with left CVA tenderness.urine cultures are negative. Patient however with some clinical improvement. Repeat CT of the abdomen and pelvis is negative for abscess however shows bilateral pyelonephritis. Will change IV Rocephin to oral ciprofloxacin D6/14, supportive care, IV fluids.   #2 dental pain/dental caries  Pain management. Will need to followup with a dentist outpatient.  #3 hypokalemia Repleted.  #4 diarrhea  C. Difficile PCR is negative. Supportive care. Imodium as needed  #5anemia H&H is stable. No active signs of GI bleed. Follow H&H.  #6 leukocytosis Likely secondary to problem #1. Improved. Continue current antibiotic treatment.  #7 prophylaxis SCDs for DVT prophylaxis.  Code Status: full Family Communication: updated patient and husband at bedside. Disposition Plan: home when medically stable   Brief narrative: 26 year old female who presented with persistent abdominal pain, fevers. Patient was found to have questionable abscess on CT abdomen pelvis and likely pyelonephritis. Patient has been slowly improving. Repeat CT scan shows bilateral pyelonephritis and negative for abscess.   Consultants:  none  Procedures:  CT of the abdomen and pelvis  Antibiotics:  IV Rocephin 06/07/2012---> 06/11/2012  Oral Cipro 06/11/2012  HPI/Subjective: Patient states  still with left sided back pain. Patient with some improvement of nausea. Patient states no further diarrhea. Patient states able to tolerate a small amount of a full liquid diet this morning. Patient states she's starting to feel better.  Objective: Filed Vitals:   06/11/12 1478 06/11/12 1308 06/11/12 2227 06/12/12 0610  BP: 112/67 129/85 131/78 113/70  Pulse: 68 76 83 72  Temp: 99 F (37.2 C) 98.4 F (36.9 C) 98.6 F (37 C) 99.1 F (37.3 C)  TempSrc: Oral  Oral Oral  Resp: 20 18 20 20   Height:      Weight:      SpO2: 99% 95% 99% 99%    Intake/Output Summary (Last 24 hours) at 06/12/12 1548 Last data filed at 06/12/12 0204  Gross per 24 hour  Intake   1488 ml  Output      0 ml  Net   1488 ml   Filed Weights   06/06/12 0748 06/06/12 0826  Weight: 77.792 kg (171 lb 8 oz) 78.1 kg (172 lb 2.9 oz)    Exam:   General:  NAD  Cardiovascular: regular rate and rhythm no murmurs rubs or gallops  Respiratory: clear to auscultation bilaterally. No wheezes no crackles no rhonchi  Abdomen: soft, nondistended. Positive bowel sounds. Patient with bilateral left greater than right CVA tenderness.  Data Reviewed: Basic Metabolic Panel:  Lab 06/12/12 2956 06/11/12 0325 06/10/12 0909 06/09/12 1035 06/08/12 0340 06/06/12 0756  NA 138 136 135 136 134* --  K 3.6 4.1 4.1 3.2* 3.3* --  CL 102 102 105 104 103 --  CO2 27 25 21 22 23  --  GLUCOSE 81 68* 76 94 81 --  BUN 4* 4* 3* 4* 4* --  CREATININE 0.75 0.74 0.63 0.63 0.71 --  CALCIUM 9.2 8.7 8.6 8.3* 8.1* --  MG --  2.0 1.7 1.8 2.3 1.3*  PHOS -- -- -- -- -- --   Liver Function Tests:  Lab 06/05/12 1818  AST 43*  ALT 52*  ALKPHOS 81  BILITOT 1.1  PROT 6.6  ALBUMIN 3.5    Lab 06/05/12 1818  LIPASE 9*  AMYLASE --   No results found for this basename: AMMONIA:5 in the last 168 hours CBC:  Lab 06/12/12 0410 06/11/12 0325 06/10/12 0909 06/09/12 1035 06/08/12 0340 06/05/12 1902  WBC 7.0 8.3 7.4 6.4 7.0 --  NEUTROABS -- -- --  3.9 -- 18.2*  HGB 10.1* 9.6* 8.9* 8.5* 8.8* --  HCT 29.9* 27.9* 25.6* 24.2* 26.1* --  MCV 81.3 81.1 81.3 80.9 82.3 --  PLT 440* 380 307 272 241 --   Cardiac Enzymes: No results found for this basename: CKTOTAL:5,CKMB:5,CKMBINDEX:5,TROPONINI:5 in the last 168 hours BNP (last 3 results) No results found for this basename: PROBNP:3 in the last 8760 hours CBG: No results found for this basename: GLUCAP:5 in the last 168 hours  Recent Results (from the past 240 hour(s))  URINE CULTURE     Status: Normal   Collection Time   06/05/12  5:59 PM      Component Value Range Status Comment   Specimen Description URINE, CLEAN CATCH   Final    Special Requests Normal   Final    Culture  Setup Time 06/06/2012 11:07   Final    Colony Count 20,OOO COLONIES/ML   Final    Culture     Final    Value: Multiple bacterial morphotypes present, none predominant. Suggest appropriate recollection if clinically indicated.   Report Status 06/07/2012 FINAL   Final   CULTURE, BLOOD (ROUTINE X 2)     Status: Normal (Preliminary result)   Collection Time   06/06/12  7:50 PM      Component Value Range Status Comment   Specimen Description BLOOD LEFT ARM   Final    Special Requests BOTTLES DRAWN AEROBIC AND ANAEROBIC 10CC EACH   Final    Culture  Setup Time 06/07/2012 03:16   Final    Culture     Final    Value:        BLOOD CULTURE RECEIVED NO GROWTH TO DATE CULTURE WILL BE HELD FOR 5 DAYS BEFORE ISSUING A FINAL NEGATIVE REPORT   Report Status PENDING   Incomplete   CULTURE, BLOOD (ROUTINE X 2)     Status: Normal (Preliminary result)   Collection Time   06/06/12  7:56 PM      Component Value Range Status Comment   Specimen Description BLOOD RIGHT ARM   Final    Special Requests BOTTLES DRAWN AEROBIC AND ANAEROBIC 10CC EACH   Final    Culture  Setup Time 06/07/2012 03:16   Final    Culture     Final    Value:        BLOOD CULTURE RECEIVED NO GROWTH TO DATE CULTURE WILL BE HELD FOR 5 DAYS BEFORE ISSUING A FINAL  NEGATIVE REPORT   Report Status PENDING   Incomplete   CLOSTRIDIUM DIFFICILE BY PCR     Status: Normal   Collection Time   06/09/12  5:40 PM      Component Value Range Status Comment   C difficile by pcr NEGATIVE  NEGATIVE Final      Studies: Dg Chest 2 View  06/05/2012  *RADIOLOGY REPORT*  Clinical Data: 26 year old female with headache dizziness difficulty breathing pain.  CHEST - 2  VIEW  Comparison: 01/12/2011.  Findings: Lower lung volumes.  Mild increased interstitial markings are felt to reflect crowding.  Cardiac size and mediastinal contours are within normal limits.  Visualized tracheal air column is within normal limits.  Hair artifact at the thoracic inlet.  No pneumothorax, pulmonary edema or pleural effusion.  No consolidation.  Right upper quadrant surgical clips. No acute osseous abnormality identified.  IMPRESSION: Low lung volumes, otherwise no acute cardiopulmonary abnormality.   Original Report Authenticated By: Harley Hallmark, M.D.    Ct Abdomen Pelvis W Contrast  06/06/2012  *RADIOLOGY REPORT*  Clinical Data: Left lower quadrant pain.  CT ABDOMEN AND PELVIS WITH CONTRAST  Technique:  Multidetector CT imaging of the abdomen and pelvis was performed following the standard protocol during bolus administration of intravenous contrast.  Contrast: OMNIPAQUE IOHEXOL 300 MG/ML  SOLN  Comparison: 01/04/2011  Findings: Mild dependent atelectasis or scarring.  Suboptimal contrast bolus timing.  Allowing for this, no focal abnormality identified within the liver, spleen, pancreas, or adrenal glands.  Absent gallbladder.  No biliary ductal dilatation.  Heterogeneous attenuation of the kidneys bilaterally with wedge- shaped areas of hypoattenuation.  More confluent ill-defined hypoattenuation within the interpolar - upper pole left kidney anteriorly measures 3 cm.  Mild perinephric fat stranding.  No hydronephrosis or hydroureter.  No bowel obstruction.  No CT evidence for colitis.  Normal  appendix.  No free intraperitoneal air.  Small amount of free fluid within the pelvis.  Thin-walled bladder.  Adnexal cyst left greater than right, likely physiologic.  Normal caliber vasculature.  No acute osseous finding.  IMPRESSION: Heterogeneous renal enhancement suggests ascending infection/pyelonephritis. Largest area involving the interpolar/upper pole left kidney measures 3 cm, suspicious for developing abscess.  No loculated fluid collection at this time.   Original Report Authenticated By: Waneta Martins, M.D.     Scheduled Meds:    . ciprofloxacin  500 mg Oral BID  . feeding supplement  237 mL Oral BID BM  . ondansetron  8 mg Intravenous Q8H   Continuous Infusions:    . sodium chloride 75 mL/hr at 06/12/12 4098    Principal Problem:  *Pyelonephritis Active Problems:  Kidney, perinephric abscess  Abdominal pain  Pain, dental  Fever  Hypokalemia  Leukocytosis  Anemia  Diarrhea    Time spent: > 30 mins    [facility]  [facility] Pager [phone]. If 8PM-8AM, please contact night-coverage at [email or web address], password [facility] 06/12/2012, 3:48 PM  LOS: 7 days

## 2012-06-12 NOTE — Progress Notes (Signed)
To whom it may concern, Karen Warren has been hospitalized at [facility] in [place], [place] under my care from 06/06/2012 until present. Jennet Maduro, is her mother and has been here in the hospital visiting with her daughter. Please excuse her absence. You may call [phone] if you have any questions. Thank you in advance.   Sincerely,   Ramiro Harvest, M.D.

## 2012-06-13 LAB — CBC
Hemoglobin: 10.6 g/dL — ABNORMAL LOW (ref 12.0–15.0)
RBC: 3.86 MIL/uL — ABNORMAL LOW (ref 3.87–5.11)
WBC: 7.2 10*3/uL (ref 4.0–10.5)

## 2012-06-13 LAB — BASIC METABOLIC PANEL
GFR calc Af Amer: >90 mL/min (ref >90)
GFR calc non Af Amer: >90 mL/min (ref >90)
Glucose, Bld: 88 mg/dL (ref 70–99)
Potassium: 3.7 mEq/L (ref 3.5–5.1)
Sodium: 138 mEq/L (ref 135–145)

## 2012-06-13 LAB — CULTURE, BLOOD (ROUTINE X 2)

## 2012-06-13 MED ORDER — ONDANSETRON 8 MG/NS 50 ML IVPB
8.0000 mg | Freq: Three times a day (TID) | INTRAVENOUS | Status: DC | PRN
  Administered 2012-06-13: 8 mg via INTRAVENOUS
  Filled 2012-06-13: qty 8

## 2012-06-13 NOTE — Progress Notes (Signed)
[facility] PROGRESS NOTE  Karen Warren ZOX:[id number] DOB: [birth year] DOA: 06/05/2012 PCP: Sheila Oats, MD  Assessment/Plan: Principal Problem:  *Pyelonephritis Active Problems:  Kidney, perinephric abscess  Abdominal pain  Pain, dental  Fever  Hypokalemia  Leukocytosis  Anemia  Diarrhea  #1 Acute bilateral pyelonephritis/UTI Patient had presented with fever, urinalysis consistent with a UTI, left-sided CVA tenderness. CT scan of the abdomen and pelvis consistent with a pyelonephritis and questionable abscess. Patient has had some improvement in his symptoms however remains nauseous with left CVA tenderness.urine cultures are negative. Patient however with clinical improvement. Repeat CT of the abdomen and pelvis is negative for abscess however shows bilateral pyelonephritis. Continue oral ciprofloxacin D7/14, supportive care, IV fluids.   #2 dental pain/dental caries  Pain management. Will need to followup with a dentist outpatient.  #3 hypokalemia Repleted.  #4 diarrhea  C. Difficile PCR is negative. Supportive care. Imodium as needed  #5anemia H&H is stable. No active signs of GI bleed. Follow H&H.  #6 leukocytosis Likely secondary to problem #1. Improved. Continue current antibiotic treatment.  #7 prophylaxis SCDs for DVT prophylaxis.  Code Status: full Family Communication: updated patient and husband at bedside. Disposition Plan: home when medically stable   Brief narrative: 26 year old female who presented with persistent abdominal pain, fevers. Patient was found to have questionable abscess on CT abdomen pelvis and likely pyelonephritis. Patient has been slowly improving. Repeat CT scan shows bilateral pyelonephritis and negative for abscess.   Consultants:  none  Procedures:  CT of the abdomen and pelvis  Antibiotics:  IV Rocephin 06/07/2012---> 06/11/2012  Oral Cipro 06/11/2012  HPI/Subjective: Patient states some improvement with  her left sided back pain. Patient with some improvement of nausea. Patient states no further diarrhea. Patient states able to tolerate full liquid diet today. Patient states she's starting to feel better.  Objective: Filed Vitals:   06/12/12 0610 06/12/12 1956 06/13/12 0520 06/13/12 1423  BP: 113/70 123/73 118/71 122/66  Pulse: 72 83 81 69  Temp: 99.1 F (37.3 C) 98.3 F (36.8 C) 98.2 F (36.8 C) 98.3 F (36.8 C)  TempSrc: Oral Oral  Oral  Resp: 20 18 18 16   Height:      Weight:      SpO2: 99% 100% 100% 100%    Intake/Output Summary (Last 24 hours) at 06/13/12 1816 Last data filed at 06/13/12 1300  Gross per 24 hour  Intake 715.83 ml  Output      0 ml  Net 715.83 ml   Filed Weights   06/06/12 0748 06/06/12 0826  Weight: 77.792 kg (171 lb 8 oz) 78.1 kg (172 lb 2.9 oz)    Exam:   General:  NAD  Cardiovascular: regular rate and rhythm no murmurs rubs or gallops  Respiratory: clear to auscultation bilaterally. No wheezes no crackles no rhonchi  Abdomen: soft, nondistended. Positive bowel sounds. Patient with bilateral left greater than right CVA tenderness.  Data Reviewed: Basic Metabolic Panel:  Lab 06/13/12 8119 06/12/12 0410 06/11/12 0325 06/10/12 0909 06/09/12 1035 06/08/12 0340  NA 138 138 136 135 136 --  K 3.7 3.6 4.1 4.1 3.2* --  CL 100 102 102 105 104 --  CO2 27 27 25 21 22  --  GLUCOSE 88 81 68* 76 94 --  BUN 4* 4* 4* 3* 4* --  CREATININE 0.84 0.75 0.74 0.63 0.63 --  CALCIUM 9.2 9.2 8.7 8.6 8.3* --  MG -- -- 2.0 1.7 1.8 2.3  PHOS -- -- -- -- -- --  Liver Function Tests: No results found for this basename: AST:5,ALT:5,ALKPHOS:5,BILITOT:5,PROT:5,ALBUMIN:5 in the last 168 hours No results found for this basename: LIPASE:5,AMYLASE:5 in the last 168 hours No results found for this basename: AMMONIA:5 in the last 168 hours CBC:  Lab 06/13/12 0426 06/12/12 0410 06/11/12 0325 06/10/12 0909 06/09/12 1035  WBC 7.2 7.0 8.3 7.4 6.4  NEUTROABS -- -- -- -- 3.9    HGB 10.6* 10.1* 9.6* 8.9* 8.5*  HCT 31.5* 29.9* 27.9* 25.6* 24.2*  MCV 81.6 81.3 81.1 81.3 80.9  PLT 504* 440* 380 307 272   Cardiac Enzymes: No results found for this basename: CKTOTAL:5,CKMB:5,CKMBINDEX:5,TROPONINI:5 in the last 168 hours BNP (last 3 results) No results found for this basename: PROBNP:3 in the last 8760 hours CBG: No results found for this basename: GLUCAP:5 in the last 168 hours  Recent Results (from the past 240 hour(s))  URINE CULTURE     Status: Normal   Collection Time   06/05/12  5:59 PM      Component Value Range Status Comment   Specimen Description URINE, CLEAN CATCH   Final    Special Requests Normal   Final    Culture  Setup Time 06/06/2012 11:07   Final    Colony Count 20,OOO COLONIES/ML   Final    Culture     Final    Value: Multiple bacterial morphotypes present, none predominant. Suggest appropriate recollection if clinically indicated.   Report Status 06/07/2012 FINAL   Final   CULTURE, BLOOD (ROUTINE X 2)     Status: Normal   Collection Time   06/06/12  7:50 PM      Component Value Range Status Comment   Specimen Description BLOOD LEFT ARM   Final    Special Requests BOTTLES DRAWN AEROBIC AND ANAEROBIC 10CC EACH   Final    Culture  Setup Time 06/07/2012 03:16   Final    Culture NO GROWTH 5 DAYS   Final    Report Status 06/13/2012 FINAL   Final   CULTURE, BLOOD (ROUTINE X 2)     Status: Normal   Collection Time   06/06/12  7:56 PM      Component Value Range Status Comment   Specimen Description BLOOD RIGHT ARM   Final    Special Requests BOTTLES DRAWN AEROBIC AND ANAEROBIC 10CC EACH   Final    Culture  Setup Time 06/07/2012 03:16   Final    Culture NO GROWTH 5 DAYS   Final    Report Status 06/13/2012 FINAL   Final   CLOSTRIDIUM DIFFICILE BY PCR     Status: Normal   Collection Time   06/09/12  5:40 PM      Component Value Range Status Comment   C difficile by pcr NEGATIVE  NEGATIVE Final      Studies: Dg Chest 2 View  06/05/2012   *RADIOLOGY REPORT*  Clinical Data: 27 year old female with headache dizziness difficulty breathing pain.  CHEST - 2 VIEW  Comparison: 01/12/2011.  Findings: Lower lung volumes.  Mild increased interstitial markings are felt to reflect crowding.  Cardiac size and mediastinal contours are within normal limits.  Visualized tracheal air column is within normal limits.  Hair artifact at the thoracic inlet.  No pneumothorax, pulmonary edema or pleural effusion.  No consolidation.  Right upper quadrant surgical clips. No acute osseous abnormality identified.  IMPRESSION: Low lung volumes, otherwise no acute cardiopulmonary abnormality.   Original Report Authenticated By: Harley Hallmark, M.D.    Ct Abdomen  Pelvis W Contrast  06/06/2012  *RADIOLOGY REPORT*  Clinical Data: Left lower quadrant pain.  CT ABDOMEN AND PELVIS WITH CONTRAST  Technique:  Multidetector CT imaging of the abdomen and pelvis was performed following the standard protocol during bolus administration of intravenous contrast.  Contrast: OMNIPAQUE IOHEXOL 300 MG/ML  SOLN  Comparison: 01/04/2011  Findings: Mild dependent atelectasis or scarring.  Suboptimal contrast bolus timing.  Allowing for this, no focal abnormality identified within the liver, spleen, pancreas, or adrenal glands.  Absent gallbladder.  No biliary ductal dilatation.  Heterogeneous attenuation of the kidneys bilaterally with wedge- shaped areas of hypoattenuation.  More confluent ill-defined hypoattenuation within the interpolar - upper pole left kidney anteriorly measures 3 cm.  Mild perinephric fat stranding.  No hydronephrosis or hydroureter.  No bowel obstruction.  No CT evidence for colitis.  Normal appendix.  No free intraperitoneal air.  Small amount of free fluid within the pelvis.  Thin-walled bladder.  Adnexal cyst left greater than right, likely physiologic.  Normal caliber vasculature.  No acute osseous finding.  IMPRESSION: Heterogeneous renal enhancement suggests  ascending infection/pyelonephritis. Largest area involving the interpolar/upper pole left kidney measures 3 cm, suspicious for developing abscess.  No loculated fluid collection at this time.   Original Report Authenticated By: Waneta Martins, M.D.     Scheduled Meds:    . ciprofloxacin  500 mg Oral BID  . feeding supplement  237 mL Oral BID BM  . DISCONTD: ondansetron  8 mg Intravenous Q8H   Continuous Infusions:    . sodium chloride 1,000 mL (06/12/12 2259)    Principal Problem:  *Pyelonephritis Active Problems:  Kidney, perinephric abscess  Abdominal pain  Pain, dental  Fever  Hypokalemia  Leukocytosis  Anemia  Diarrhea    Time spent: > 30 mins    [facility]  [facility] Pager [phone]. If 8PM-8AM, please contact night-coverage at [email or web address], password [facility] 06/13/2012, 6:16 PM  LOS: 8 days

## 2012-06-13 NOTE — Plan of Care (Signed)
Problem: Phase III Progression Outcomes Goal: Tolerating diet Outcome: Progressing Diet advanced to bland

## 2012-06-13 NOTE — Plan of Care (Signed)
Problem: Phase III Progression Outcomes Goal: Pain controlled on oral analgesia Outcome: Progressing Started taking Oxy IR 10 mg

## 2012-06-13 NOTE — Plan of Care (Signed)
Problem: Phase III Progression Outcomes Goal: IV Medications to PO Outcome: Progressing Patient cont with iv zofran

## 2012-06-13 NOTE — Progress Notes (Signed)
Pt. Complaining Lt flank pain .Medicated for nausea x1. Pt also  Had a headache earlier and was medicated with Tylenol.

## 2012-06-14 LAB — BASIC METABOLIC PANEL
CO2: 26 mEq/L (ref 19–32)
Glucose, Bld: 88 mg/dL (ref 70–99)
Potassium: 3.5 mEq/L (ref 3.5–5.1)
Sodium: 135 mEq/L (ref 135–145)

## 2012-06-14 LAB — CBC
Hemoglobin: 10.6 g/dL — ABNORMAL LOW (ref 12.0–15.0)
MCH: 27.7 pg (ref 26.0–34.0)
MCV: 82.2 fL (ref 78.0–100.0)
RBC: 3.83 MIL/uL — ABNORMAL LOW (ref 3.87–5.11)
WBC: 5.5 10*3/uL (ref 4.0–10.5)

## 2012-06-14 MED ORDER — POTASSIUM CHLORIDE CRYS ER 20 MEQ PO TBCR
40.0000 meq | EXTENDED_RELEASE_TABLET | Freq: Once | ORAL | Status: AC
  Administered 2012-06-14: 40 meq via ORAL
  Filled 2012-06-14: qty 2

## 2012-06-14 MED ORDER — PROCHLORPERAZINE MALEATE 10 MG PO TABS
10.0000 mg | ORAL_TABLET | Freq: Four times a day (QID) | ORAL | Status: DC | PRN
  Administered 2012-06-14 – 2012-06-15 (×3): 10 mg via ORAL
  Filled 2012-06-14 (×3): qty 1

## 2012-06-14 MED ORDER — OXYCODONE HCL 5 MG PO TABS
5.0000 mg | ORAL_TABLET | ORAL | Status: DC | PRN
  Administered 2012-06-14 – 2012-06-15 (×5): 10 mg via ORAL
  Filled 2012-06-14 (×5): qty 2

## 2012-06-14 NOTE — Progress Notes (Signed)
Pt. States that she is tolerating PO pain meds well and that they are helping to control pain.

## 2012-06-14 NOTE — Progress Notes (Signed)
[facility] PROGRESS NOTE  Karen Warren WGN:[id number] DOB: 09-20-1986 DOA: 06/05/2012 PCP: Sheila Oats, MD  Assessment/Plan: Principal Problem:  *Pyelonephritis Active Problems:  Kidney, perinephric abscess  Abdominal pain  Pain, dental  Fever  Hypokalemia  Leukocytosis  Anemia  Diarrhea  #1 Acute bilateral pyelonephritis/UTI Patient had presented with fever, urinalysis consistent with a UTI, left-sided CVA tenderness. CT scan of the abdomen and pelvis consistent with a pyelonephritis and questionable abscess. Patient has had some improvement in his symptoms however remains nauseous with left CVA tenderness.urine cultures are negative. Patient however with clinical improvement. Repeat CT of the abdomen and pelvis is negative for abscess however shows bilateral pyelonephritis. Continue oral ciprofloxacin D8/14, supportive care, IV fluids.   #2 dental pain/dental caries  Pain management. Will need to followup with a dentist outpatient.  #3 hypokalemia Repleted.  #4 diarrhea  C. Difficile PCR is negative. Supportive care. Imodium as needed  #5anemia H&H is stable. No active signs of GI bleed. Follow H&H.  #6 leukocytosis Likely secondary to problem #1. Improved. Continue current antibiotic treatment.  #7 prophylaxis SCDs for DVT prophylaxis.  Code Status: full Family Communication: updated patient and husband at bedside. Disposition Plan: home when medically stable   Brief narrative: 26 year old female who presented with persistent abdominal pain, fevers. Patient was found to have questionable abscess on CT abdomen pelvis and likely pyelonephritis. Patient has been slowly improving. Repeat CT scan shows bilateral pyelonephritis and negative for abscess.   Consultants:  none  Procedures:  CT of the abdomen and pelvis  Antibiotics:  IV Rocephin 06/07/2012---> 06/11/2012  Oral Cipro 06/11/2012  HPI/Subjective: Patient states some improvement with  her left sided back pain. Patient with some improvement of nausea. Patient states no further diarrhea. Patient states able to tolerate bland diet today. Patient states she's starting to feel better.  Objective: Filed Vitals:   06/13/12 1900 06/14/12 0620 06/14/12 1010 06/14/12 1415  BP: 117/79 110/59 99/45 113/68  Pulse: 70 72 78 86  Temp: 98.4 F (36.9 C) 98.9 F (37.2 C) 98.8 F (37.1 C) 97.9 F (36.6 C)  TempSrc: Oral Oral Oral Oral  Resp: 18 16 18 20   Height:      Weight:      SpO2: 100% 100% 98% 100%    Intake/Output Summary (Last 24 hours) at 06/14/12 1651 Last data filed at 06/14/12 0500  Gross per 24 hour  Intake  522.5 ml  Output      0 ml  Net  522.5 ml   Filed Weights   06/06/12 0748 06/06/12 0826  Weight: 77.792 kg (171 lb 8 oz) 78.1 kg (172 lb 2.9 oz)    Exam:   General:  NAD  Cardiovascular: regular rate and rhythm no murmurs rubs or gallops  Respiratory: clear to auscultation bilaterally. No wheezes no crackles no rhonchi  Abdomen: soft, nondistended. Positive bowel sounds. Patient with bilateral left greater than right CVA tenderness improving.  Data Reviewed: Basic Metabolic Panel:  Lab 06/14/12 7846 06/13/12 0426 06/12/12 0410 06/11/12 0325 06/10/12 0909 06/09/12 1035 06/08/12 0340  NA 135 138 138 136 135 -- --  K 3.5 3.7 3.6 4.1 4.1 -- --  CL 100 100 102 102 105 -- --  CO2 26 27 27 25 21  -- --  GLUCOSE 88 88 81 68* 76 -- --  BUN 5* 4* 4* 4* 3* -- --  CREATININE 0.83 0.84 0.75 0.74 0.63 -- --  CALCIUM 9.2 9.2 9.2 8.7 8.6 -- --  MG -- -- --  2.0 1.7 1.8 2.3  PHOS -- -- -- -- -- -- --   Liver Function Tests: No results found for this basename: AST:5,ALT:5,ALKPHOS:5,BILITOT:5,PROT:5,ALBUMIN:5 in the last 168 hours No results found for this basename: LIPASE:5,AMYLASE:5 in the last 168 hours No results found for this basename: AMMONIA:5 in the last 168 hours CBC:  Lab 06/14/12 0345 06/13/12 0426 06/12/12 0410 06/11/12 0325 06/10/12 0909  06/09/12 1035  WBC 5.5 7.2 7.0 8.3 7.4 --  NEUTROABS -- -- -- -- -- 3.9  HGB 10.6* 10.6* 10.1* 9.6* 8.9* --  HCT 31.5* 31.5* 29.9* 27.9* 25.6* --  MCV 82.2 81.6 81.3 81.1 81.3 --  PLT 472* 504* 440* 380 307 --   Cardiac Enzymes: No results found for this basename: CKTOTAL:5,CKMB:5,CKMBINDEX:5,TROPONINI:5 in the last 168 hours BNP (last 3 results) No results found for this basename: PROBNP:3 in the last 8760 hours CBG: No results found for this basename: GLUCAP:5 in the last 168 hours  Recent Results (from the past 240 hour(s))  URINE CULTURE     Status: Normal   Collection Time   06/05/12  5:59 PM      Component Value Range Status Comment   Specimen Description URINE, CLEAN CATCH   Final    Special Requests Normal   Final    Culture  Setup Time 06/06/2012 11:07   Final    Colony Count 20,OOO COLONIES/ML   Final    Culture     Final    Value: Multiple bacterial morphotypes present, none predominant. Suggest appropriate recollection if clinically indicated.   Report Status 06/07/2012 FINAL   Final   CULTURE, BLOOD (ROUTINE X 2)     Status: Normal   Collection Time   06/06/12  7:50 PM      Component Value Range Status Comment   Specimen Description BLOOD LEFT ARM   Final    Special Requests BOTTLES DRAWN AEROBIC AND ANAEROBIC 10CC EACH   Final    Culture  Setup Time 06/07/2012 03:16   Final    Culture NO GROWTH 5 DAYS   Final    Report Status 06/13/2012 FINAL   Final   CULTURE, BLOOD (ROUTINE X 2)     Status: Normal   Collection Time   06/06/12  7:56 PM      Component Value Range Status Comment   Specimen Description BLOOD RIGHT ARM   Final    Special Requests BOTTLES DRAWN AEROBIC AND ANAEROBIC 10CC EACH   Final    Culture  Setup Time 06/07/2012 03:16   Final    Culture NO GROWTH 5 DAYS   Final    Report Status 06/13/2012 FINAL   Final   CLOSTRIDIUM DIFFICILE BY PCR     Status: Normal   Collection Time   06/09/12  5:40 PM      Component Value Range Status Comment   C  difficile by pcr NEGATIVE  NEGATIVE Final      Studies: Dg Chest 2 View  06/05/2012  *RADIOLOGY REPORT*  Clinical Data: 26 year old female with headache dizziness difficulty breathing pain.  CHEST - 2 VIEW  Comparison: 01/12/2011.  Findings: Lower lung volumes.  Mild increased interstitial markings are felt to reflect crowding.  Cardiac size and mediastinal contours are within normal limits.  Visualized tracheal air column is within normal limits.  Hair artifact at the thoracic inlet.  No pneumothorax, pulmonary edema or pleural effusion.  No consolidation.  Right upper quadrant surgical clips. No acute osseous abnormality identified.  IMPRESSION: Low  lung volumes, otherwise no acute cardiopulmonary abnormality.   Original Report Authenticated By: Harley Hallmark, M.D.    Ct Abdomen Pelvis W Contrast  06/06/2012  *RADIOLOGY REPORT*  Clinical Data: Left lower quadrant pain.  CT ABDOMEN AND PELVIS WITH CONTRAST  Technique:  Multidetector CT imaging of the abdomen and pelvis was performed following the standard protocol during bolus administration of intravenous contrast.  Contrast: OMNIPAQUE IOHEXOL 300 MG/ML  SOLN  Comparison: 01/04/2011  Findings: Mild dependent atelectasis or scarring.  Suboptimal contrast bolus timing.  Allowing for this, no focal abnormality identified within the liver, spleen, pancreas, or adrenal glands.  Absent gallbladder.  No biliary ductal dilatation.  Heterogeneous attenuation of the kidneys bilaterally with wedge- shaped areas of hypoattenuation.  More confluent ill-defined hypoattenuation within the interpolar - upper pole left kidney anteriorly measures 3 cm.  Mild perinephric fat stranding.  No hydronephrosis or hydroureter.  No bowel obstruction.  No CT evidence for colitis.  Normal appendix.  No free intraperitoneal air.  Small amount of free fluid within the pelvis.  Thin-walled bladder.  Adnexal cyst left greater than right, likely physiologic.  Normal caliber  vasculature.  No acute osseous finding.  IMPRESSION: Heterogeneous renal enhancement suggests ascending infection/pyelonephritis. Largest area involving the interpolar/upper pole left kidney measures 3 cm, suspicious for developing abscess.  No loculated fluid collection at this time.   Original Report Authenticated By: Waneta Martins, M.D.     Scheduled Meds:    . ciprofloxacin  500 mg Oral BID  . feeding supplement  237 mL Oral BID BM  . potassium chloride  40 mEq Oral Once   Continuous Infusions:    . DISCONTD: sodium chloride 1,000 mL (06/12/12 2259)    Principal Problem:  *Pyelonephritis Active Problems:  Kidney, perinephric abscess  Abdominal pain  Pain, dental  Fever  Hypokalemia  Leukocytosis  Anemia  Diarrhea    Time spent: > 30 mins    [facility]  [facility] Pager [phone]. If 8PM-8AM, please contact night-coverage at [email or web address], password [facility] 06/14/2012, 4:51 PM  LOS: 9 days

## 2012-06-15 LAB — BASIC METABOLIC PANEL
CO2: 27 mEq/L (ref 19–32)
Calcium: 9.6 mg/dL (ref 8.4–10.5)
Chloride: 99 mEq/L (ref 96–112)
Creatinine, Ser: 0.91 mg/dL (ref 0.50–1.10)
Glucose, Bld: 84 mg/dL (ref 70–99)
Sodium: 137 mEq/L (ref 135–145)

## 2012-06-15 MED ORDER — OXYCODONE HCL 5 MG PO TABS: 5.0000 mg | ORAL_TABLET | ORAL | Status: AC | PRN

## 2012-06-15 MED ORDER — ENSURE COMPLETE PO LIQD: 237.0000 mL | Freq: Two times a day (BID) | ORAL | Status: DC

## 2012-06-15 MED ORDER — CIPROFLOXACIN HCL 500 MG PO TABS: 500.0000 mg | ORAL_TABLET | Freq: Two times a day (BID) | ORAL | Status: AC

## 2012-06-15 MED ORDER — PROCHLORPERAZINE MALEATE 10 MG PO TABS: 10.0000 mg | ORAL_TABLET | Freq: Four times a day (QID) | ORAL | Status: DC | PRN

## 2012-06-15 NOTE — Discharge Summary (Signed)
Physician Discharge Summary  LARENDA REEDY OZH:[id number] DOB: June 10, 1986 DOA: 06/05/2012  PCP: Sheila Oats, MD  Admit date: 06/05/2012 Discharge date: 06/15/2012  Recommendations for Outpatient Follow-up:  1. Patient is to pick a PCP to followup with one week post discharge. On followup and a basic metabolic profile need to be obtained to followup on patient's electrolytes and renal function. Patient's pyelonephritis bilateral will need to be reassessed per PCP. 2. Patient is to followup with a dentist as outpatient for her dental caries and questionable periodontal abscess 3. Patient is to followup with OB/GYN as outpatient for her adnexal cyst.  Discharge Diagnoses:  Principal Problem:  *Pyelonephritis Active Problems:  Abdominal pain  Pain, dental  Fever  Hypokalemia  Leukocytosis  Anemia  Diarrhea   Discharge Condition: Stable and improved  Diet recommendation: Regular bland diet  Filed Weights   06/06/12 0748 06/06/12 0826  Weight: 77.792 kg (171 lb 8 oz) 78.1 kg (172 lb 2.9 oz)    History of present illness:  26 year old female who presented with persistent abdominal pain, fevers. Patient was found to have questionable abscess on CT abdomen pelvis and likely pyelonephritis. Patient has been slowly improving. Repeat CT scan shows bilateral pyelonephritis and negative for abscess.   Hospital Course:  #1 acute bilateral pyelonephritis Patient was admitted with persistent abdominal pain fevers and CVA tenderness with urinalysis consistent with a UTI. CT of the abdomen and pelvis was consistent with pyelonephritis with questionable abscess on CT obtained on 06/06/2012. Patient was initially made n.p.o. placed on IV fluids, IV Rocephin, antiemetics,  pain medications and supportive care. Patient's pain slowly improved during the hospitalization and the nausea also slowly improved. Patient's leukocytosis trended down and she remained afebrile for the rest of the  hospitalization. Blood cultures which were obtained were negative x2. A urine GC Chlamydia was also obtained which was negative. Patient did have a wet prep sent which was also negative. Urine cultures obtained on the grout 20,000 colonies of multiple bacterial morphotypes. However due to patient's persistent CVA tenderness urinalysis consistent with UTI and CT scan findings she was maintained on IV antibiotics. Repeat CT scan was obtained which showed bilateral pyelonephritis and negative for abscess formation. Patient was monitored subsequently started on clear liquids and slowly advance to a bland diet. Patient's antibiotics was subsequently transitioned to oral ciprofloxacin and patient will be discharged home on 6 more days of oral antibiotics to complete a two-week course of antibiotic therapy. Patient improved clinically by day of discharge patient was in stable and improved condition and will pick a PCP to followup with as outpatient one week post discharge. Patient be discharged in stable and improved condition.  #2 dental pain On admission patient had some complaints of tooth pain and a such a orthopantogram was done on 06/09/2012. The orthopantogram did show large dental caries involving the left second maxillary molar, extensive periapical lucency involving the left second and potentially first maxillary molar which was felt may represent a periodontal abscess. There was no significant dental caries or periodontal disease within the mandible. Patient's pain was managed and patient was given a list of dentists to followup with as outpatient.  #3 hypokalemia On admission patient was noted to be hypokalemic which was felt secondary to GI losses that patient did have some diarrhea. Patient's magnesium levels were checked and both a magnesium and potassium was repleted. Patient's hypokalemia had resolved by day of discharge. Patient will need to followup with PCP one week post discharge where a basic  metabolic profile need to be drawn to followup on her electrolytes and renal function.   #4 diarrhea On admission patient was noted to have some diarrhea during the hospitalization C. difficile PCR was checked which was negative. Patient's diarrhea improved and she was placed on Imodium as needed. Patient's diarrhea had resolved by day of discharge.  The rest of patient's chronic medical issues remained stable throughout the hospitalization and patient be discharged in stable and improved condition.  Procedures:  CT abdomen and pelvis 06/06/2012, 06/12/2012  Orthopantogram 06/09/2012  Chest x-ray 06/05/2012  Consultations:  NONE  Discharge Exam: Filed Vitals:   06/15/12 1000  BP: 116/61  Pulse: 72  Temp: 98.3 F (36.8 C)  Resp: 18   Filed Vitals:   06/14/12 1805 06/14/12 2142 06/15/12 0600 06/15/12 1000  BP: 106/59 114/70 106/57 116/61  Pulse: 80 79 64 72  Temp: 97.8 F (36.6 C) 97.8 F (36.6 C) 97.9 F (36.6 C) 98.3 F (36.8 C)  TempSrc: Oral Oral Oral Oral  Resp: 18 16 16 18   Height:      Weight:      SpO2: 100% 100% 100% 100%    General: NAD Cardiovascular: RRR Respiratory: CTAB Abdomen: Soft, NT,ND, +BS, DECREASED L CVA TTP.  Discharge Instructions  Discharge Orders    Future Orders Please Complete By Expires   Diet general      Comments:   BLAND DIET   Increase activity slowly      Discharge instructions      Comments:   Follow up with PCP in 1 week Follow up with dentist Follow up with OBGYN.     Medication List  As of 06/15/2012  1:11 PM   STOP taking these medications         PENICILLIN V POTASSIUM PO         TAKE these medications         ciprofloxacin 500 MG tablet   Commonly known as: CIPRO   Take 1 tablet (500 mg total) by mouth 2 (two) times daily. Take for 6 more days then stop.      feeding supplement Liqd   Take 237 mLs by mouth 2 (two) times daily between meals.      HYDROcodone-acetaminophen 5-500 MG per tablet    Commonly known as: VICODIN   Take 1 tablet by mouth every 6 (six) hours as needed for pain.      oxyCODONE 5 MG immediate release tablet   Commonly known as: Oxy IR/ROXICODONE   Take 1-2 tablets (5-10 mg total) by mouth every 3 (three) hours as needed.      prochlorperazine 10 MG tablet   Commonly known as: COMPAZINE   Take 1 tablet (10 mg total) by mouth every 6 (six) hours as needed.           Follow-up Information    Follow up with [facility]. (Please call the dentist for followup next week.)    Contact information:   Individual [address] [place] [address] [phone]       Schedule an appointment as soon as possible for a visit in 1 week to follow up. (Patient to find PCP and f/u in 1 week.)           The results of significant diagnostics from this hospitalization (including imaging, microbiology, ancillary and laboratory) are listed below for reference.    Significant Diagnostic Studies: Dg Orthopantogram  06/09/2012  *RADIOLOGY REPORT*  Clinical Data: Left-sided jaw  pain.  Possible abscess.  ORTHOPANTOGRAM/PANORAMIC  Comparison: None.  Findings: A large dental caries is evident at the left second to maxillary molar.  Periapical lucency is present at this tooth. It is unclear if the third left maxillary molar has been extracted versus a large dental caries.  I favor the former.  A prominent dental caries involves the second right maxillary molar.  No significant dental caries or periodontal disease is evident within the mandible.  The mandible is intact and located.  IMPRESSION:  1.  Large dental caries involving the left second maxillary molar. 2.  Extensive periapical lucency involving the left second and potentially a first maxillary molar.  This may represent a periodontal abscess. 3.  No significant dental caries are periodontal disease within the mandible.   Original Report Authenticated By: Jamesetta Orleans. MATTERN, M.D.    Dg  Chest 2 View  06/05/2012  *RADIOLOGY REPORT*  Clinical Data: 26 year old female with headache dizziness difficulty breathing pain.  CHEST - 2 VIEW  Comparison: 01/12/2011.  Findings: Lower lung volumes.  Mild increased interstitial markings are felt to reflect crowding.  Cardiac size and mediastinal contours are within normal limits.  Visualized tracheal air column is within normal limits.  Hair artifact at the thoracic inlet.  No pneumothorax, pulmonary edema or pleural effusion.  No consolidation.  Right upper quadrant surgical clips. No acute osseous abnormality identified.  IMPRESSION: Low lung volumes, otherwise no acute cardiopulmonary abnormality.   Original Report Authenticated By: Harley Hallmark, M.D.    Ct Abdomen Pelvis W Contrast  06/12/2012  *RADIOLOGY REPORT*  Clinical Data: History of pyelonephritis.  Pain.  Ongoing nausea. Rule out abscess.  CT ABDOMEN AND PELVIS WITH CONTRAST  Technique:  Multidetector CT imaging of the abdomen and pelvis was performed following the standard protocol during bolus administration of intravenous contrast.  Contrast: OMNIPAQUE IOHEXOL 300 MG/ML  SOLN  Comparison: 06/06/2012  Findings: Motion degradation at the lung bases.  Small bilateral pleural effusions which are new since the prior exam.  Normal heart size.  The hepatic dome is minimally excluded. Focal steatosis adjacent the falciform ligament.  Normal spleen, stomach, pancreas. Cholecystectomy without biliary ductal dilatation.  Motion degradation continues into the upper abdomen.  Normal adrenal glands.  Persistent heterogeneous enhancement of the kidneys bilaterally. Areas of more focal hypoattenuation involving the interpolar left kidney anteriorly measures 2.6 cm.  Low density area within measures slightly greater than fluid density on image 12.  Thickening of the surrounding renal fascia on the left, but no perirenal abscess.  Mildly prominent retroperitoneal nodes which are likely reactive. The  renal veins are patent.  No retrocrural adenopathy.  Normal colon and terminal ileum.  Normal appendix on image 49.  Normal small bowel without abdominal ascites.  No pelvic adenopathy.  Normal urinary bladder and uterus.  The left ovarian cystic lesion measures 5.7 cm and has  a fluid-fluid level within.  Slightly enlarged from 06/06/2012.  Small volume cul-de-sac fluid is similar to on the prior exam and simple in density.  No acute osseous abnormality.  IMPRESSION:  1.  Mildly motion degraded exam. 2.  Bilateral pyelonephritis.  Areas of heterogeneous hypoattenuation in the anterior interpolar left kidney likely represent  more focal infection/developing phlegmon.  No evidence of fluid density collection to suggest drainable abscess. 3.  New small bilateral pleural effusions. 4.  Enlargement of a left adnexal/ovarian lesion which is likely a hemorrhagic cyst.  Ultrasound follow-up at 6 or 12 weeks, in the week  following the patient's menses, should be considered. 5.  Small volume cul-de-sac fluid could be physiologic or reactive secondary to pyelonephritis.   Original Report Authenticated By: Consuello Bossier, M.D.    Ct Abdomen Pelvis W Contrast  06/06/2012  *RADIOLOGY REPORT*  Clinical Data: Left lower quadrant pain.  CT ABDOMEN AND PELVIS WITH CONTRAST  Technique:  Multidetector CT imaging of the abdomen and pelvis was performed following the standard protocol during bolus administration of intravenous contrast.  Contrast: OMNIPAQUE IOHEXOL 300 MG/ML  SOLN  Comparison: 01/04/2011  Findings: Mild dependent atelectasis or scarring.  Suboptimal contrast bolus timing.  Allowing for this, no focal abnormality identified within the liver, spleen, pancreas, or adrenal glands.  Absent gallbladder.  No biliary ductal dilatation.  Heterogeneous attenuation of the kidneys bilaterally with wedge- shaped areas of hypoattenuation.  More confluent ill-defined hypoattenuation within the interpolar - upper pole left  kidney anteriorly measures 3 cm.  Mild perinephric fat stranding.  No hydronephrosis or hydroureter.  No bowel obstruction.  No CT evidence for colitis.  Normal appendix.  No free intraperitoneal air.  Small amount of free fluid within the pelvis.  Thin-walled bladder.  Adnexal cyst left greater than right, likely physiologic.  Normal caliber vasculature.  No acute osseous finding.  IMPRESSION: Heterogeneous renal enhancement suggests ascending infection/pyelonephritis. Largest area involving the interpolar/upper pole left kidney measures 3 cm, suspicious for developing abscess.  No loculated fluid collection at this time.   Original Report Authenticated By: Waneta Martins, M.D.     Microbiology: Recent Results (from the past 240 hour(s))  URINE CULTURE     Status: Normal   Collection Time   06/05/12  5:59 PM      Component Value Range Status Comment   Specimen Description URINE, CLEAN CATCH   Final    Special Requests Normal   Final    Culture  Setup Time 06/06/2012 11:07   Final    Colony Count 20,OOO COLONIES/ML   Final    Culture     Final    Value: Multiple bacterial morphotypes present, none predominant. Suggest appropriate recollection if clinically indicated.   Report Status 06/07/2012 FINAL   Final   CULTURE, BLOOD (ROUTINE X 2)     Status: Normal   Collection Time   06/06/12  7:50 PM      Component Value Range Status Comment   Specimen Description BLOOD LEFT ARM   Final    Special Requests BOTTLES DRAWN AEROBIC AND ANAEROBIC 10CC EACH   Final    Culture  Setup Time 06/07/2012 03:16   Final    Culture NO GROWTH 5 DAYS   Final    Report Status 06/13/2012 FINAL   Final   CULTURE, BLOOD (ROUTINE X 2)     Status: Normal   Collection Time   06/06/12  7:56 PM      Component Value Range Status Comment   Specimen Description BLOOD RIGHT ARM   Final    Special Requests BOTTLES DRAWN AEROBIC AND ANAEROBIC 10CC EACH   Final    Culture  Setup Time 06/07/2012 03:16   Final    Culture NO  GROWTH 5 DAYS   Final    Report Status 06/13/2012 FINAL   Final   CLOSTRIDIUM DIFFICILE BY PCR     Status: Normal   Collection Time   06/09/12  5:40 PM      Component Value Range Status Comment   C difficile by pcr NEGATIVE  NEGATIVE Final  Labs: Basic Metabolic Panel:  Lab 06/15/12 1610 06/14/12 0345 06/13/12 0426 06/12/12 0410 06/11/12 0325 06/10/12 0909 06/09/12 1035  NA 137 135 138 138 136 -- --  K 4.1 3.5 3.7 3.6 4.1 -- --  CL 99 100 100 102 102 -- --  CO2 27 26 27 27 25  -- --  GLUCOSE 84 88 88 81 68* -- --  BUN 10 5* 4* 4* 4* -- --  CREATININE 0.91 0.83 0.84 0.75 0.74 -- --  CALCIUM 9.6 9.2 9.2 9.2 8.7 -- --  MG -- -- -- -- 2.0 1.7 1.8  PHOS -- -- -- -- -- -- --   Liver Function Tests: No results found for this basename: AST:5,ALT:5,ALKPHOS:5,BILITOT:5,PROT:5,ALBUMIN:5 in the last 168 hours No results found for this basename: LIPASE:5,AMYLASE:5 in the last 168 hours No results found for this basename: AMMONIA:5 in the last 168 hours CBC:  Lab 06/14/12 0345 06/13/12 0426 06/12/12 0410 06/11/12 0325 06/10/12 0909 06/09/12 1035  WBC 5.5 7.2 7.0 8.3 7.4 --  NEUTROABS -- -- -- -- -- 3.9  HGB 10.6* 10.6* 10.1* 9.6* 8.9* --  HCT 31.5* 31.5* 29.9* 27.9* 25.6* --  MCV 82.2 81.6 81.3 81.1 81.3 --  PLT 472* 504* 440* 380 307 --   Cardiac Enzymes: No results found for this basename: CKTOTAL:5,CKMB:5,CKMBINDEX:5,TROPONINI:5 in the last 168 hours BNP: BNP (last 3 results) No results found for this basename: PROBNP:3 in the last 8760 hours CBG: No results found for this basename: GLUCAP:5 in the last 168 hours  Time coordinating discharge: 60 minutes  Signed:  THOMPSON,DANIEL  [facility] 06/15/2012, 1:11 PM

## 2014-04-21 ENCOUNTER — Encounter (HOSPITAL_COMMUNITY): Payer: Self-pay | Admitting: Emergency Medicine

## 2014-04-21 ENCOUNTER — Inpatient Hospital Stay (HOSPITAL_COMMUNITY)
Admission: EM | Admit: 2014-04-21 | Discharge: 2014-04-25 | DRG: 872 | Disposition: A | Discharge status: Home / Self Care | Payer: Self-pay | Attending: Internal Medicine | Admitting: Internal Medicine

## 2014-04-21 ENCOUNTER — Inpatient Hospital Stay (HOSPITAL_COMMUNITY): Payer: Self-pay

## 2014-04-21 DIAGNOSIS — E876 Hypokalemia: Secondary | ICD-10-CM | POA: Diagnosis present

## 2014-04-21 DIAGNOSIS — A419 Sepsis, unspecified organism: Principal | ICD-10-CM | POA: Diagnosis present

## 2014-04-21 DIAGNOSIS — D72829 Elevated white blood cell count, unspecified: Secondary | ICD-10-CM

## 2014-04-21 DIAGNOSIS — Z9089 Acquired absence of other organs: Secondary | ICD-10-CM

## 2014-04-21 DIAGNOSIS — N12 Tubulo-interstitial nephritis, not specified as acute or chronic: Secondary | ICD-10-CM

## 2014-04-21 DIAGNOSIS — F17200 Nicotine dependence, unspecified, uncomplicated: Secondary | ICD-10-CM | POA: Diagnosis present

## 2014-04-21 DIAGNOSIS — K0889 Other specified disorders of teeth and supporting structures: Secondary | ICD-10-CM

## 2014-04-21 DIAGNOSIS — R109 Unspecified abdominal pain: Secondary | ICD-10-CM | POA: Diagnosis present

## 2014-04-21 DIAGNOSIS — N10 Acute tubulo-interstitial nephritis: Secondary | ICD-10-CM | POA: Diagnosis present

## 2014-04-21 DIAGNOSIS — A498 Other bacterial infections of unspecified site: Secondary | ICD-10-CM | POA: Diagnosis present

## 2014-04-21 DIAGNOSIS — Z833 Family history of diabetes mellitus: Secondary | ICD-10-CM

## 2014-04-21 DIAGNOSIS — K5900 Constipation, unspecified: Secondary | ICD-10-CM | POA: Diagnosis present

## 2014-04-21 DIAGNOSIS — R197 Diarrhea, unspecified: Secondary | ICD-10-CM

## 2014-04-21 DIAGNOSIS — Z8249 Family history of ischemic heart disease and other diseases of the circulatory system: Secondary | ICD-10-CM

## 2014-04-21 LAB — COMPREHENSIVE METABOLIC PANEL
ALBUMIN: 3.7 g/dL (ref 3.5–5.2)
ALK PHOS: 115 U/L (ref 39–117)
ALT: 35 U/L (ref 0–35)
AST: 20 U/L (ref 0–37)
Anion gap: 16 — ABNORMAL HIGH (ref 5–15)
BILIRUBIN TOTAL: 1.9 mg/dL — AB (ref 0.3–1.2)
BUN: 17 mg/dL (ref 6–23)
CHLORIDE: 100 meq/L (ref 96–112)
CO2: 22 mEq/L (ref 19–32)
Calcium: 9.2 mg/dL (ref 8.4–10.5)
Creatinine, Ser: 1.03 mg/dL (ref 0.50–1.10)
GFR calc Af Amer: 86 mL/min — ABNORMAL LOW (ref >90)
GFR calc non Af Amer: 74 mL/min — ABNORMAL LOW (ref >90)
Glucose, Bld: 104 mg/dL — ABNORMAL HIGH (ref 70–99)
POTASSIUM: 3 meq/L — AB (ref 3.7–5.3)
Sodium: 138 mEq/L (ref 137–147)
Total Protein: 7.5 g/dL (ref 6.0–8.3)

## 2014-04-21 LAB — URINALYSIS, ROUTINE W REFLEX MICROSCOPIC
GLUCOSE, UA: NEGATIVE mg/dL
KETONES UR: 40 mg/dL — AB
NITRITE: POSITIVE — AB
PROTEIN: 100 mg/dL — AB
Specific Gravity, Urine: 1.02 (ref 1.005–1.030)
Urobilinogen, UA: 4 mg/dL — ABNORMAL HIGH (ref 0.0–1.0)
pH: 6 (ref 5.0–8.0)

## 2014-04-21 LAB — CBC WITH DIFFERENTIAL/PLATELET
BASOS ABS: 0 10*3/uL (ref 0.0–0.1)
BASOS PCT: 0 % (ref 0–1)
Eosinophils Absolute: 0 10*3/uL (ref 0.0–0.7)
Eosinophils Relative: 0 % (ref 0–5)
HCT: 34.2 % — ABNORMAL LOW (ref 36.0–46.0)
HEMOGLOBIN: 11.5 g/dL — AB (ref 12.0–15.0)
Lymphocytes Relative: 5 % — ABNORMAL LOW (ref 12–46)
Lymphs Abs: 0.8 10*3/uL (ref 0.7–4.0)
MCH: 28.2 pg (ref 26.0–34.0)
MCHC: 33.6 g/dL (ref 30.0–36.0)
MCV: 83.8 fL (ref 78.0–100.0)
MONOS PCT: 8 % (ref 3–12)
Monocytes Absolute: 1.3 10*3/uL — ABNORMAL HIGH (ref 0.1–1.0)
NEUTROS ABS: 14.6 10*3/uL — AB (ref 1.7–7.7)
NEUTROS PCT: 87 % — AB (ref 43–77)
Platelets: 244 10*3/uL (ref 150–400)
RBC: 4.08 MIL/uL (ref 3.87–5.11)
RDW: 14.1 % (ref 11.5–15.5)
WBC: 16.8 10*3/uL — AB (ref 4.0–10.5)

## 2014-04-21 LAB — URINE MICROSCOPIC-ADD ON

## 2014-04-21 LAB — PREGNANCY, URINE: Preg Test, Ur: NEGATIVE

## 2014-04-21 LAB — I-STAT CG4 LACTIC ACID, ED: Lactic Acid, Venous: 0.92 mmol/L (ref 0.5–2.2)

## 2014-04-21 MED ORDER — POTASSIUM CHLORIDE CRYS ER 20 MEQ PO TBCR
60.0000 meq | EXTENDED_RELEASE_TABLET | Freq: Once | ORAL | Status: AC
  Administered 2014-04-21: 60 meq via ORAL
  Filled 2014-04-21: qty 3

## 2014-04-21 MED ORDER — SODIUM CHLORIDE 0.9 % IV SOLN
1000.0000 mL | INTRAVENOUS | Status: DC
  Administered 2014-04-21: 1000 mL via INTRAVENOUS

## 2014-04-21 MED ORDER — HEPARIN SODIUM (PORCINE) 5000 UNIT/ML IJ SOLN
5000.0000 [IU] | Freq: Three times a day (TID) | INTRAMUSCULAR | Status: DC
  Administered 2014-04-21 – 2014-04-25 (×13): 5000 [IU] via SUBCUTANEOUS
  Filled 2014-04-21 (×16): qty 1

## 2014-04-21 MED ORDER — SODIUM CHLORIDE 0.9 % IV SOLN
1000.0000 mL | Freq: Once | INTRAVENOUS | Status: AC
  Administered 2014-04-21: 1000 mL via INTRAVENOUS

## 2014-04-21 MED ORDER — MORPHINE SULFATE 2 MG/ML IJ SOLN
1.0000 mg | INTRAMUSCULAR | Status: DC | PRN
  Administered 2014-04-21 – 2014-04-22 (×4): 1 mg via INTRAVENOUS
  Filled 2014-04-21 (×4): qty 1

## 2014-04-21 MED ORDER — SODIUM CHLORIDE 0.9 % IV SOLN
INTRAVENOUS | Status: DC
  Administered 2014-04-21 – 2014-04-25 (×11): via INTRAVENOUS

## 2014-04-21 MED ORDER — POTASSIUM CHLORIDE 10 MEQ/100ML IV SOLN
10.0000 meq | INTRAVENOUS | Status: DC
  Administered 2014-04-21 (×2): 10 meq via INTRAVENOUS
  Filled 2014-04-21 (×3): qty 100

## 2014-04-21 MED ORDER — MORPHINE SULFATE 4 MG/ML IJ SOLN
4.0000 mg | Freq: Once | INTRAMUSCULAR | Status: AC
  Administered 2014-04-21: 4 mg via INTRAVENOUS
  Filled 2014-04-21: qty 1

## 2014-04-21 MED ORDER — IOHEXOL 300 MG/ML  SOLN
100.0000 mL | Freq: Once | INTRAMUSCULAR | Status: AC | PRN
  Administered 2014-04-21: 100 mL via INTRAVENOUS

## 2014-04-21 MED ORDER — MAGNESIUM SULFATE 40 MG/ML IJ SOLN
2.0000 g | Freq: Once | INTRAMUSCULAR | Status: AC
  Administered 2014-04-21: 2 g via INTRAVENOUS
  Filled 2014-04-21: qty 50

## 2014-04-21 MED ORDER — SODIUM CHLORIDE 0.9 % IV SOLN
1000.0000 mL | Freq: Once | INTRAVENOUS | Status: AC
Start: 2014-04-21 — End: 2014-04-21
  Administered 2014-04-21: 1000 mL via INTRAVENOUS

## 2014-04-21 MED ORDER — ACETAMINOPHEN 650 MG RE SUPP: 650.0000 mg | Freq: Four times a day (QID) | RECTAL | Status: DC | PRN

## 2014-04-21 MED ORDER — IOHEXOL 300 MG/ML  SOLN
50.0000 mL | Freq: Once | INTRAMUSCULAR | Status: AC | PRN
  Administered 2014-04-21: 50 mL via ORAL

## 2014-04-21 MED ORDER — ONDANSETRON HCL 4 MG/2ML IJ SOLN
4.0000 mg | Freq: Once | INTRAMUSCULAR | Status: AC
  Administered 2014-04-21: 4 mg via INTRAVENOUS
  Filled 2014-04-21: qty 2

## 2014-04-21 MED ORDER — DEXTROSE 5 % IV SOLN
1.0000 g | Freq: Once | INTRAVENOUS | Status: AC
Start: 2014-04-21 — End: 2014-04-21
  Administered 2014-04-21: 1 g via INTRAVENOUS
  Filled 2014-04-21: qty 10

## 2014-04-21 MED ORDER — ONDANSETRON HCL 4 MG/2ML IJ SOLN
4.0000 mg | Freq: Four times a day (QID) | INTRAMUSCULAR | Status: DC | PRN
  Administered 2014-04-21 – 2014-04-25 (×15): 4 mg via INTRAVENOUS
  Filled 2014-04-21 (×15): qty 2

## 2014-04-21 MED ORDER — ONDANSETRON HCL 4 MG PO TABS
4.0000 mg | ORAL_TABLET | Freq: Four times a day (QID) | ORAL | Status: DC | PRN
  Administered 2014-04-21 – 2014-04-25 (×2): 4 mg via ORAL
  Filled 2014-04-21 (×2): qty 1

## 2014-04-21 MED ORDER — DEXTROSE 5 % IV SOLN
1.0000 g | INTRAVENOUS | Status: DC
  Administered 2014-04-22 – 2014-04-24 (×3): 1 g via INTRAVENOUS
  Filled 2014-04-21 (×3): qty 10

## 2014-04-21 MED ORDER — HYDROCODONE-ACETAMINOPHEN 5-325 MG PO TABS
1.0000 | ORAL_TABLET | ORAL | Status: DC | PRN
  Administered 2014-04-21 – 2014-04-22 (×3): 2 via ORAL
  Filled 2014-04-21 (×3): qty 2

## 2014-04-21 MED ORDER — ACETAMINOPHEN 325 MG PO TABS
650.0000 mg | ORAL_TABLET | Freq: Four times a day (QID) | ORAL | Status: DC | PRN
  Administered 2014-04-25: 650 mg via ORAL
  Filled 2014-04-21 (×2): qty 2

## 2014-04-21 NOTE — H&P (Signed)
[facility] History and Physical  Karen LatchLatasha M Stitely ZOX:[id number] DOB: 07/08/1986 DOA: 04/21/2014  Referring physician: EDP PCP: Default, Provider, MD   Chief Complaint: Nausea and vomiting with right flank pain  HPI: Karen Warren is a 28 y.o. female with no significant past medical history, status post cholecystectomy and appendectomy. Patient came in to the hospital with nausea, vomiting and right flank pain. Patient denies burning while passing urine, upon further questioning she has some fever and chills as well. Patient had similar episode in 2013 where she end up diagnosed with bilateral acute pyelonephritis. In the ED she had fever of 99.4, urinalysis is consistent with UTI, WBC of 16.8 patient admitted to the hospital for further evaluation and medical management.  Review of Systems:  Constitutional: negative for anorexia, fevers and sweats Eyes: negative for irritation, redness and visual disturbance Ears, nose, mouth, throat, and face: negative for earaches, epistaxis, nasal congestion and sore throat Respiratory: negative for cough, dyspnea on exertion, sputum and wheezing Cardiovascular: negative for chest pain, dyspnea, lower extremity edema, orthopnea, palpitations and syncope Gastrointestinal: Has nausea and vomiting Genitourinary:negative for dysuria, frequency and hematuria Hematologic/lymphatic: negative for bleeding, easy bruising and lymphadenopathy Musculoskeletal:negative for arthralgias, muscle weakness and stiff joints Neurological: negative for coordination problems, gait problems, headaches and weakness Endocrine: negative for diabetic symptoms including polydipsia, polyuria and weight loss Allergic/Immunologic: negative for anaphylaxis, hay fever and urticaria  History reviewed. No pertinent past medical history. Past Surgical History  Procedure Laterality Date  . Cholecystectomy    . Cesarean section    . Tubal ligation     Social History:    reports that she has been smoking Cigarettes.  She has been smoking about 0.30 packs per day. She has never used smokeless tobacco. She reports that she does not drink alcohol or use illicit drugs.  No Known Allergies  Family History  Problem Relation Age of Onset  . Hypertension Mother   . Hypertension Maternal Grandmother   . Diabetes Maternal Grandmother      Prior to Admission medications   Medication Sig Start Date End Date Taking? Authorizing Provider  acetaminophen (TYLENOL) 500 MG tablet Take 1,000 mg by mouth every 6 (six) hours as needed for mild pain.   Yes Historical Provider, MD   Physical Exam: Filed Vitals:   04/21/14 0732  BP: 99/59  Pulse: 88  Temp: 98.2 F (36.8 C)  Resp: 16   Constitutional: Oriented to person, place, and time. Well-developed and well-nourished. Cooperative.  Head: Normocephalic and atraumatic.  Nose: Nose normal.  Mouth/Throat: Uvula is midline, oropharynx is clear and moist and mucous membranes are normal.  Eyes: Conjunctivae and EOM are normal. Pupils are equal, round, and reactive to light.  Neck: Trachea normal and normal range of motion. Neck supple.  Cardiovascular: Normal rate, regular rhythm, S1 normal, S2 normal, normal heart sounds and intact distal pulses.   Pulmonary/Chest: Effort normal and breath sounds normal.  Abdominal: Soft, left flank tenderness  Musculoskeletal: Normal range of motion.  Neurological: Alert and oriented to person, place, and time. has normal strength. No cranial nerve deficit or sensory deficit.  Skin: Skin is warm, dry and intact.  Psychiatric: Has a normal mood and affect. Speech is normal and behavior is normal.   Labs on Admission:  Basic Metabolic Panel:  Recent Labs Lab 04/21/14 0502  NA 138  K 3.0*  CL 100  CO2 22  GLUCOSE 104*  BUN 17  CREATININE 1.03  CALCIUM 9.2   Liver  Function Tests:  Recent Labs Lab 04/21/14 0502  AST 20  ALT 35  ALKPHOS 115  BILITOT 1.9*  PROT 7.5   ALBUMIN 3.7   No results found for this basename: LIPASE, AMYLASE,  in the last 168 hours No results found for this basename: AMMONIA,  in the last 168 hours CBC:  Recent Labs Lab 04/21/14 0502  WBC 16.8*  NEUTROABS 14.6*  HGB 11.5*  HCT 34.2*  MCV 83.8  PLT 244   Cardiac Enzymes: No results found for this basename: CKTOTAL, CKMB, CKMBINDEX, TROPONINI,  in the last 168 hours  BNP (last 3 results) No results found for this basename: PROBNP,  in the last 8760 hours CBG: No results found for this basename: GLUCAP,  in the last 168 hours  Radiological Exams on Admission: No results found.  EKG: Independently reviewed.   Assessment/Plan Principal Problem:   Acute pyelonephritis Active Problems:   Abdominal pain   Hypokalemia   Sepsis    Acute pyelonephritis -Urinalysis consistent with UTI with significant right flank pain. -Urine and blood culture obtained, patient started on Rocephin in the ED, will continue. -We'll adjust antibiotics according to the culture results, patient will need at least 14 days of antibiotics. -Check CT scan of abdomen/pelvis to rule out complications, previously had early perinephric abscess.  Sepsis -Patient needs sepsis criteria with heart rate of 119, WBC of 16.8 and presence of UTI/pyelonephritis. -Aggressive hydration with IV fluids, continue antibiotics and supportive management.  Abdominal pain -Secondary to acute UTI/pyelonephritis. -Continue antiemetics and pain medications.  Hypokalemia -She tends to have low potassium, I will complete with potassium supplements.  Code Status: Full code Family Communication: Plan discussed with the patient Disposition Plan: MedSurg, inpatient  Time spent: 70 minutes  [facility] Pager [phone]

## 2014-04-21 NOTE — ED Notes (Signed)
Upon standing and ambulating to the restroom, pt reports feeling dizzy.  Dr. Norlene Campbelltter made aware.

## 2014-04-21 NOTE — ED Provider Notes (Signed)
CSN: [id number]     Arrival date & time 04/21/14  0430 History   First MD Initiated Contact with Patient 04/21/14 [phone]     Chief Complaint  Patient presents with  . Flank Pain     (Consider location/radiation/quality/duration/timing/severity/associated sxs/prior Treatment) HPI 28 yo female presents to the ER from home via EMS with complaint of right flank pain, nausea and vomiting.  Pt reports onset of symptoms on Tuesday, 3 days ago, along with fever and chills.  Pt has h/o pyleonephritis about 2 years ago.  Pt does not have pcm.  She denies any urinary symptoms, no vaginal discharge.  Pt is s/p cholecystectomy and appendectomy, c-section/tubal ligation.  Pt was febril to 101.4 per EMS, received tylenol from them.  Pt reports tylenol made her feel worse.   History reviewed. No pertinent past medical history. Past Surgical History  Procedure Laterality Date  . Cholecystectomy    . Cesarean section    . Tubal ligation     Family History  Problem Relation Age of Onset  . Hypertension Mother   . Hypertension Maternal Grandmother   . Diabetes Maternal Grandmother    History  Substance Use Topics  . Smoking status: Current Every Day Smoker -- 0.30 packs/day    Types: Cigarettes  . Smokeless tobacco: Never Used  . Alcohol Use: No   OB History   Grav Para Term Preterm Abortions TAB SAB Ect Mult Living                 Review of Systems   See History of Present Illness; otherwise all other systems are reviewed and negative  Allergies  Review of patient's allergies indicates no known allergies.  Home Medications   Prior to Admission medications   Medication Sig Start Date End Date Taking? Authorizing Provider  acetaminophen (TYLENOL) 500 MG tablet Take 1,000 mg by mouth every 6 (six) hours as needed for mild pain.   Yes Historical Provider, MD   BP 118/65  Pulse 114  Temp(Src) 99.4 F (37.4 C) (Oral)  Resp 18  SpO2 100%  LMP 04/05/2014 Physical Exam  Nursing note and  vitals reviewed. Constitutional: She is oriented to person, place, and time. She appears well-developed and well-nourished. She appears distressed (patient appears to be in pain, crying).  HENT:  Head: Normocephalic and atraumatic.  Right Ear: External ear normal.  Left Ear: External ear normal.  Nose: Nose normal.  Dry mucous membranes  Eyes: Conjunctivae and EOM are normal. Pupils are equal, round, and reactive to light.  Neck: Normal range of motion. Neck supple. No JVD present. No tracheal deviation present. No thyromegaly present.  Cardiovascular: Regular rhythm, normal heart sounds and intact distal pulses.  Exam reveals no gallop and no friction rub.   No murmur heard. Tachycardia noted  Pulmonary/Chest: Effort normal and breath sounds normal. No stridor. No respiratory distress. She has no wheezes. She has no rales. She exhibits no tenderness.  Abdominal: Soft. Bowel sounds are normal. She exhibits no distension and no mass. There is tenderness (patient has tenderness in epigastrium, right upper quadrant and right mid abdomen). There is no rebound and no guarding.  Positive CVA tenderness on right  Musculoskeletal: Normal range of motion. She exhibits no edema and no tenderness.  Lymphadenopathy:    She has no cervical adenopathy.  Neurological: She is alert and oriented to person, place, and time. She exhibits normal muscle tone. Coordination normal.  Skin: Skin is warm and dry. No rash noted. No  erythema. No pallor.  Psychiatric: She has a normal mood and affect. Her behavior is normal. Judgment and thought content normal.    ED Course  Procedures (including critical care time) Labs Review Labs Reviewed  CBC WITH DIFFERENTIAL - Abnormal; Notable for the following:    WBC 16.8 (*)    Hemoglobin 11.5 (*)    HCT 34.2 (*)    Neutrophils Relative % 87 (*)    Neutro Abs 14.6 (*)    Lymphocytes Relative 5 (*)    Monocytes Absolute 1.3 (*)    All other components within normal  limits  COMPREHENSIVE METABOLIC PANEL - Abnormal; Notable for the following:    Potassium 3.0 (*)    Glucose, Bld 104 (*)    Total Bilirubin 1.9 (*)    GFR calc non Af Amer 74 (*)    GFR calc Af Amer 86 (*)    Anion gap 16 (*)    All other components within normal limits  URINALYSIS, ROUTINE W REFLEX MICROSCOPIC - Abnormal; Notable for the following:    Color, Urine ORANGE (*)    APPearance TURBID (*)    Hgb urine dipstick SMALL (*)    Bilirubin Urine SMALL (*)    Ketones, ur 40 (*)    Protein, ur 100 (*)    Urobilinogen, UA 4.0 (*)    Nitrite POSITIVE (*)    Leukocytes, UA MODERATE (*)    All other components within normal limits  URINE MICROSCOPIC-ADD ON - Abnormal; Notable for the following:    Squamous Epithelial / LPF MANY (*)    Bacteria, UA MANY (*)    All other components within normal limits  URINE CULTURE  CULTURE, BLOOD (ROUTINE X 2)  CULTURE, BLOOD (ROUTINE X 2)  PREGNANCY, URINE  I-STAT CG4 LACTIC ACID, ED    Imaging Review No results found.   EKG Interpretation None      MDM   Final diagnoses:  Pyelonephritis  Hypokalemia    28 year old female with right flank pain fever, history of pyelonephritis.  Concern for return of same.  Plan for labs cultures urine, IV fluid and pain medication.  Patient reports history of appendectomy, however on her last CT scan from 2 years ago she had a appendix noted.   Olivia Mackielga M Marry Kusch, MD 04/21/14 [phone]

## 2014-04-21 NOTE — ED Notes (Signed)
Bed: [id number] Expected date: 04/21/14 Expected time: 4:14 AM Means of arrival: Ambulance Comments: Fever, flank pain

## 2014-04-21 NOTE — ED Notes (Addendum)
Per EMS report: pt from home: pt c/o right flank pain x 2 days.  Pt has nausea and vomited several times at home.  Pt hx of kidney infection.  On EMS arrival, pt had a temperature of 101.4 in which EMS gave pt 1000mg  of tylenol and her temperature on arrival is 99.4.  EMS also gave pt 4mg  zofran.  Pt a/o x 4 and tearful upon arrival to ED.

## 2014-04-21 NOTE — Progress Notes (Signed)
UR completed 

## 2014-04-22 LAB — BASIC METABOLIC PANEL
Anion gap: 11 (ref 5–15)
BUN: 8 mg/dL (ref 6–23)
CALCIUM: 8.2 mg/dL — AB (ref 8.4–10.5)
CO2: 19 mEq/L (ref 19–32)
CREATININE: 0.8 mg/dL (ref 0.50–1.10)
Chloride: 108 mEq/L (ref 96–112)
Glucose, Bld: 87 mg/dL (ref 70–99)
Potassium: 4 mEq/L (ref 3.7–5.3)
Sodium: 138 mEq/L (ref 137–147)

## 2014-04-22 LAB — CBC
HCT: 27.1 % — ABNORMAL LOW (ref 36.0–46.0)
Hemoglobin: 9.3 g/dL — ABNORMAL LOW (ref 12.0–15.0)
MCH: 28.4 pg (ref 26.0–34.0)
MCHC: 34.3 g/dL (ref 30.0–36.0)
MCV: 82.9 fL (ref 78.0–100.0)
PLATELETS: 211 10*3/uL (ref 150–400)
RBC: 3.27 MIL/uL — ABNORMAL LOW (ref 3.87–5.11)
RDW: 14.1 % (ref 11.5–15.5)
WBC: 16 10*3/uL — ABNORMAL HIGH (ref 4.0–10.5)

## 2014-04-22 LAB — MAGNESIUM: Magnesium: 2.5 mg/dL (ref 1.5–2.5)

## 2014-04-22 MED ORDER — HYDROMORPHONE HCL PF 1 MG/ML IJ SOLN
1.0000 mg | INTRAMUSCULAR | Status: DC | PRN
  Administered 2014-04-22 – 2014-04-24 (×12): 1 mg via INTRAVENOUS
  Filled 2014-04-22 (×12): qty 1

## 2014-04-22 NOTE — Progress Notes (Addendum)
[facility] PROGRESS NOTE  Karen Warren ZOX:[id number] DOB: [birth year] DOA: 04/21/2014 PCP: Default, Provider, MD  Assessment/Plan: Principal Problem:   Acute pyelonephritis Active Problems:   Abdominal pain   Hypokalemia   Sepsis   Acute pyelonephritis  -Urinalysis consistent with UTI with significant right flank pain.  -Urine and blood culture obtained, patient started on Rocephin, continue -We'll adjust antibiotics according to the culture results, patient will need at least 14 days of antibiotics.  CT scan shows pyelonephritis in the right kidney, no renal abscess Diarrhea Rule out C. difficile Sepsis  -Patient needs sepsis criteria with heart rate of 119, WBC of 16.8 and presence of UTI/pyelonephritis.  -Aggressive hydration with IV fluids, continue antibiotics and supportive management.  Abdominal pain  -Secondary to acute UTI/pyelonephritis.  -Continue antiemetics and pain medications.  Hypokalemia  -She tends to have low potassium, continue to monitor levels     Code Status: full Family Communication: family updated about patient's clinical progress Disposition Plan:  As above    Brief narrative: Karen Warren is a 28 y.o. female with no significant past medical history, status post cholecystectomy and appendectomy. Patient came in to the hospital with nausea, vomiting and right flank pain. Patient denies burning while passing urine, upon further questioning she has some fever and chills as well. Patient had similar episode in 2013 where she end up diagnosed with bilateral acute pyelonephritis.  In the ED she had fever of 99.4, urinalysis is consistent with UTI, WBC of 16.8 patient admitted to the hospital for further evaluation and medical management.   Consultants:  None   Procedures:  None   Antibiotics:  rocephine   HPI/Subjective: Nauseous ,no vomitting, states that she had diarrhea all night  Objective: Filed Vitals:   04/21/14 0732  04/21/14 1456 04/21/14 2104 04/22/14 0539  BP: 99/59 99/67 105/63 106/66  Pulse: 88 86 103 99  Temp: 98.2 F (36.8 C) 98.4 F (36.9 C) 99.8 F (37.7 C) 99 F (37.2 C)  TempSrc: Oral Oral Oral Oral  Resp: 16 18 16 18   Height: 5\' 2"  (1.575 m)     Weight: 76.6 kg (168 lb 14 oz)     SpO2: 100% 100% 98% 98%    Intake/Output Summary (Last 24 hours) at 04/22/14 1142 Last data filed at 04/22/14 8119  Gross per 24 hour  Intake 2626.25 ml  Output      0 ml  Net 2626.25 ml    Exam:  HENT:  Head: Atraumatic.  Nose: Nose normal.  Mouth/Throat: Oropharynx is clear and moist.  Eyes: Conjunctivae are normal. Pupils are equal, round, and reactive to light. No scleral icterus.  Neck: Neck supple. No tracheal deviation present.  Cardiovascular: Normal rate, regular rhythm, normal heart sounds and intact distal pulses.  Pulmonary/Chest: Effort normal and breath sounds normal. No respiratory distress.  Abdominal: Soft. Normal appearance and bowel sounds are normal. She exhibits no distension. There is no tenderness.  Musculoskeletal: She exhibits no edema and no tenderness.  Neurological: She is alert. No cranial nerve deficit.    Data Reviewed: Basic Metabolic Panel:  Recent Labs Lab 04/21/14 0502 04/22/14 0550  NA 138 138  K 3.0* 4.0  CL 100 108  CO2 22 19  GLUCOSE 104* 87  BUN 17 8  CREATININE 1.03 0.80  CALCIUM 9.2 8.2*  MG  --  2.5    Liver Function Tests:  Recent Labs Lab 04/21/14 0502  AST 20  ALT 35  ALKPHOS 115  BILITOT  1.9*  PROT 7.5  ALBUMIN 3.7   No results found for this basename: LIPASE, AMYLASE,  in the last 168 hours No results found for this basename: AMMONIA,  in the last 168 hours  CBC:  Recent Labs Lab 04/21/14 0502 04/22/14 0550  WBC 16.8* 16.0*  NEUTROABS 14.6*  --   HGB 11.5* 9.3*  HCT 34.2* 27.1*  MCV 83.8 82.9  PLT 244 211    Cardiac Enzymes: No results found for this basename: CKTOTAL, CKMB, CKMBINDEX, TROPONINI,  in the last  168 hours BNP (last 3 results) No results found for this basename: PROBNP,  in the last 8760 hours   CBG: No results found for this basename: GLUCAP,  in the last 168 hours  No results found for this or any previous visit (from the past 240 hour(s)).   Studies: Ct Abdomen Pelvis W Contrast  04/21/2014   CLINICAL DATA:  Flank pain with nausea and vomiting  EXAM: CT ABDOMEN AND PELVIS WITH CONTRAST  TECHNIQUE: Multidetector CT imaging of the abdomen and pelvis was performed using the standard protocol following bolus administration of intravenous contrast. Oral contrast was also administered.  CONTRAST:  100mL OMNIPAQUE IOHEXOL 300 MG/ML  SOLN  COMPARISON:  June 12, 2012  FINDINGS: There is mild bibasilar lung atelectatic change.  Liver is enlarged, measuring 20.8 cm in length. There is fatty infiltration in the liver. No focal liver masses are appreciable. Gallbladder is absent. There is no biliary duct dilatation.  Spleen, pancreas, and adrenals appear normal. The right kidney appears slightly edematous with mild perinephric fluid. Similar changes are not seen in the left kidney. There is minimal scarring in the left kidney anteriorly in an area of previous pyelonephritis. There is decreased attenuation in the mid and upper posterior right kidney compared to the left. There is no well-defined renal mass in either kidney. There is no hydronephrosis or calculus in either kidney. There is no ureteral calculus on either side.  In the pelvis, there is a cyst arising from the left ovary measuring 4.5 by 4.0 cm. There is moderate pelvic ascites. No other pelvic mass is seen. Appendix appears normal.  There is a small amount of increased attenuation in the anterior lower abdominal wall with minimal air in this area.  There is no bowel obstruction. No free air or portal venous air. There is no adenopathy or abscess in the abdomen or pelvis. There is no abdominal aortic aneurysm. There are no blastic or lytic  bone lesions.  IMPRESSION: Findings felt to be consistent with pyelonephritis in the right kidney. A well-defined renal abscess is not seen. Close clinical and imaging surveillance of the right kidney may well be advised given the findings currently present involving the right kidney, however. Note that there is no hydronephrosis or calculus in either kidney or ureter.  There is a left ovarian cyst with moderate surrounding ascites. Suspect recent ovarian cyst leakage/ rupture.  Appendix appears normal.  No bowel obstruction.  No abscess.  Liver enlarged with fatty infiltration.  Increased attenuation with minimal air in the anterior lower abdominal wall region. The appearance is consistent with recent injection or other penetrating injury in this area causing minimal air and what most likely represents mild hematoma.   Electronically Signed   By: Bretta BangWilliam  Woodruff M.D.   On: 04/21/2014 12:37    Scheduled Meds: . cefTRIAXone (ROCEPHIN)  IV  1 g Intravenous Q24H  . heparin  5,000 Units Subcutaneous 3 times per day  Continuous Infusions: . sodium chloride 125 mL/hr at 04/22/14 1610    Principal Problem:   Acute pyelonephritis Active Problems:   Abdominal pain   Hypokalemia   Sepsis    Time spent: 40 minutes   [facility]  [facility] Pager [phone]. If 8PM-8AM, please contact night-coverage at [email or web address], password [facility] 04/22/2014, 11:42 AM  LOS: 1 day

## 2014-04-23 LAB — CLOSTRIDIUM DIFFICILE BY PCR: CDIFFPCR: NEGATIVE

## 2014-04-23 LAB — CBC
HCT: 25.1 % — ABNORMAL LOW (ref 36.0–46.0)
Hemoglobin: 8.6 g/dL — ABNORMAL LOW (ref 12.0–15.0)
MCH: 28.8 pg (ref 26.0–34.0)
MCHC: 34.3 g/dL (ref 30.0–36.0)
MCV: 83.9 fL (ref 78.0–100.0)
PLATELETS: 207 10*3/uL (ref 150–400)
RBC: 2.99 MIL/uL — ABNORMAL LOW (ref 3.87–5.11)
RDW: 14.2 % (ref 11.5–15.5)
WBC: 8.5 10*3/uL (ref 4.0–10.5)

## 2014-04-23 LAB — COMPREHENSIVE METABOLIC PANEL
ALBUMIN: 2.6 g/dL — AB (ref 3.5–5.2)
ALK PHOS: 104 U/L (ref 39–117)
ALT: 19 U/L (ref 0–35)
AST: 13 U/L (ref 0–37)
Anion gap: 11 (ref 5–15)
BUN: 5 mg/dL — ABNORMAL LOW (ref 6–23)
CO2: 19 mEq/L (ref 19–32)
Calcium: 8.2 mg/dL — ABNORMAL LOW (ref 8.4–10.5)
Chloride: 107 mEq/L (ref 96–112)
Creatinine, Ser: 0.77 mg/dL (ref 0.50–1.10)
GFR calc Af Amer: >90 mL/min (ref >90)
GFR calc non Af Amer: >90 mL/min (ref >90)
GLUCOSE: 84 mg/dL (ref 70–99)
POTASSIUM: 3.7 meq/L (ref 3.7–5.3)
SODIUM: 137 meq/L (ref 137–147)
TOTAL PROTEIN: 6.1 g/dL (ref 6.0–8.3)
Total Bilirubin: 0.7 mg/dL (ref 0.3–1.2)

## 2014-04-23 MED ORDER — LOPERAMIDE HCL 2 MG PO CAPS: 4.0000 mg | ORAL_CAPSULE | Freq: Four times a day (QID) | ORAL | Status: DC | PRN

## 2014-04-23 MED ORDER — LOPERAMIDE HCL 2 MG PO CAPS
4.0000 mg | ORAL_CAPSULE | Freq: Once | ORAL | Status: AC
  Administered 2014-04-23: 4 mg via ORAL
  Filled 2014-04-23: qty 2

## 2014-04-23 NOTE — Progress Notes (Signed)
[facility] PROGRESS NOTE  Karen Warren ZOX:[id number] DOB: 29-May-1986 DOA: 04/21/2014 PCP: Default, Provider, MD  Assessment/Plan: Principal Problem:   Acute pyelonephritis Active Problems:   Abdominal pain   Hypokalemia   Sepsis    Acute pyelonephritis  -Urinalysis consistent with UTI with significant right flank pain.  -Urine and blood culture obtained, patient started on Rocephin, continue  -We'll adjust antibiotics according to the culture results, patient will need at least 14 days of antibiotics.  CT scan shows pyelonephritis in the right kidney, no renal abscess  WBC count down to normal  Diarrhea  C. difficile negative  Sepsis  -Patient needs sepsis criteria with heart rate of 119, WBC of 16.8 and presence of UTI/pyelonephritis.  -Aggressive hydration with IV fluids, continue antibiotics and supportive management.   Abdominal pain  -Secondary to acute UTI/pyelonephritis.  -Continue antiemetics and pain medications.  Hypokalemia  -She tends to have low potassium, continue to monitor levels    Code Status: full  Family Communication: family updated about patient's clinical progress  Disposition Plan: Possible discharge tomorrow if feeling better  Brief narrative:  Karen Warren is a 28 y.o. female with no significant past medical history, status post cholecystectomy and appendectomy. Patient came in to the hospital with nausea, vomiting and right flank pain. Patient denies burning while passing urine, upon further questioning she has some fever and chills as well. Patient had similar episode in 2013 where she end up diagnosed with bilateral acute pyelonephritis.  In the ED she had fever of 99.4, urinalysis is consistent with UTI, WBC of 16.8 patient admitted to the hospital for further evaluation and medical management.  Consultants:  None  Procedures:  None  Antibiotics:  rocephine  HPI/Subjective:  Nauseous ,no vomitting, diarrhea improving, still  did not have an appetite   Objective: Filed Vitals:   04/22/14 1441 04/22/14 2054 04/22/14 2242 04/23/14 0504  BP: 121/75 109/69  111/69  Pulse: 92 94  86  Temp: 98.1 F (36.7 C) 100.3 F (37.9 C) 99.2 F (37.3 C) 98.1 F (36.7 C)  TempSrc: Oral Oral Oral Oral  Resp: 18 16  18   Height:      Weight:      SpO2: 100% 100%  97%    Intake/Output Summary (Last 24 hours) at 04/23/14 1158 Last data filed at 04/23/14 0700  Gross per 24 hour  Intake   1860 ml  Output      0 ml  Net   1860 ml    Exam:  HENT:  Head: Atraumatic.  Nose: Nose normal.  Mouth/Throat: Oropharynx is clear and moist.  Eyes: Conjunctivae are normal. Pupils are equal, round, and reactive to light. No scleral icterus.  Neck: Neck supple. No tracheal deviation present.  Cardiovascular: Normal rate, regular rhythm, normal heart sounds and intact distal pulses.  Pulmonary/Chest: Effort normal and breath sounds normal. No respiratory distress.  Abdominal: Soft. Normal appearance and bowel sounds are normal. She exhibits no distension. There is no tenderness.  Musculoskeletal: She exhibits no edema and no tenderness.  Neurological: She is alert. No cranial nerve deficit.    Data Reviewed: Basic Metabolic Panel:  Recent Labs Lab 04/21/14 0502 04/22/14 0550 04/23/14 0556  NA 138 138 137  K 3.0* 4.0 3.7  CL 100 108 107  CO2 22 19 19   GLUCOSE 104* 87 84  BUN 17 8 5*  CREATININE 1.03 0.80 0.77  CALCIUM 9.2 8.2* 8.2*  MG  --  2.5  --  Liver Function Tests:  Recent Labs Lab 04/21/14 0502 04/23/14 0556  AST 20 13  ALT 35 19  ALKPHOS 115 104  BILITOT 1.9* 0.7  PROT 7.5 6.1  ALBUMIN 3.7 2.6*   No results found for this basename: LIPASE, AMYLASE,  in the last 168 hours No results found for this basename: AMMONIA,  in the last 168 hours  CBC:  Recent Labs Lab 04/21/14 0502 04/22/14 0550 04/23/14 0556  WBC 16.8* 16.0* 8.5  NEUTROABS 14.6*  --   --   HGB 11.5* 9.3* 8.6*  HCT 34.2*  27.1* 25.1*  MCV 83.8 82.9 83.9  PLT 244 211 207    Cardiac Enzymes: No results found for this basename: CKTOTAL, CKMB, CKMBINDEX, TROPONINI,  in the last 168 hours BNP (last 3 results) No results found for this basename: PROBNP,  in the last 8760 hours   CBG: No results found for this basename: GLUCAP,  in the last 168 hours  Recent Results (from the past 240 hour(s))  CULTURE, BLOOD (ROUTINE X 2)     Status: None   Collection Time    04/21/14  5:02 AM      Result Value Ref Range Status   Specimen Description BLOOD RIGHT HAND   Final   Special Requests BOTTLES DRAWN AEROBIC AND ANAEROBIC 5 CC EACH   Final   Culture  Setup Time     Final   Value: 04/21/2014 08:54     [facility]formed [facility]   Culture     Final   Value:        BLOOD CULTURE RECEIVED NO GROWTH TO DATE CULTURE WILL BE HELD FOR 5 DAYS BEFORE ISSUING A FINAL NEGATIVE REPORT     Performed at [facility]   Report Status PENDING   Incomplete  CULTURE, BLOOD (ROUTINE X 2)     Status: None   Collection Time    04/21/14  5:11 AM      Result Value Ref Range Status   Specimen Description BLOOD RIGHT ARM   Final   Special Requests BOTTLES DRAWN AEROBIC AND ANAEROBIC 5 CC EACH   Final   Culture  Setup Time     Final   Value: 04/21/2014 08:54     Performed at [facility]   Culture     Final   Value:        BLOOD CULTURE RECEIVED NO GROWTH TO DATE CULTURE WILL BE HELD FOR 5 DAYS BEFORE ISSUING A FINAL NEGATIVE REPORT     Performed at [facility]   Report Status PENDING   Incomplete  URINE CULTURE     Status: None   Collection Time    04/21/14  5:25 AM      Result Value Ref Range Status   Specimen Description URINE, CLEAN CATCH   Final   Special Requests NONE   Final   Culture  Setup Time     Final   Value: 04/21/2014 09:17     [facility]formed at [facility]   Colony Count     Final   Value: >=100,000 COLONIES/ML     Performed at [facility]   Culture     Final    Value: ESCHERICHIA COLI     Performed at [facility]   Report Status PENDING   Incomplete  CLOSTRIDIUM DIFFICILE BY PCR     Status: None   Collection Time    04/22/14  6:37 PM  Result Value Ref Range Status   C difficile by pcr NEGATIVE  NEGATIVE Final   Comment: Performed at [facility]     Studies: Ct Abdomen Pelvis W Contrast  04/21/2014   CLINICAL DATA:  Flank pain with nausea and vomiting  EXAM: CT ABDOMEN AND PELVIS WITH CONTRAST  TECHNIQUE: Multidetector CT imaging of the abdomen and pelvis was performed using the standard protocol following bolus administration of intravenous contrast. Oral contrast was also administered.  CONTRAST:  OMNIPAQUE IOHEXOL 300 MG/ML  SOLN  COMPARISON:  June 12, 2012  FINDINGS: There is mild bibasilar lung atelectatic change.  Liver is enlarged, measuring 20.8 cm in length. There is fatty infiltration in the liver. No focal liver masses are appreciable. Gallbladder is absent. There is no biliary duct dilatation.  Spleen, pancreas, and adrenals appear normal. The right kidney appears slightly edematous with mild perinephric fluid. Similar changes are not seen in the left kidney. There is minimal scarring in the left kidney anteriorly in an area of previous pyelonephritis. There is decreased attenuation in the mid and upper posterior right kidney compared to the left. There is no well-defined renal mass in either kidney. There is no hydronephrosis or calculus in either kidney. There is no ureteral calculus on either side.  In the pelvis, there is a cyst arising from the left ovary measuring 4.5 by 4.0 cm. There is moderate pelvic ascites. No other pelvic mass is seen. Appendix appears normal.  There is a small amount of increased attenuation in the anterior lower abdominal wall with minimal air in this area.  There is no bowel obstruction. No free air or portal venous air. There is no adenopathy or abscess in the abdomen or pelvis. There is  no abdominal aortic aneurysm. There are no blastic or lytic bone lesions.  IMPRESSION: Findings felt to be consistent with pyelonephritis in the right kidney. A well-defined renal abscess is not seen. Close clinical and imaging surveillance of the right kidney may well be advised given the findings currently present involving the right kidney, however. Note that there is no hydronephrosis or calculus in either kidney or ureter.  There is a left ovarian cyst with moderate surrounding ascites. Suspect recent ovarian cyst leakage/ rupture.  Appendix appears normal.  No bowel obstruction.  No abscess.  Liver enlarged with fatty infiltration.  Increased attenuation with minimal air in the anterior lower abdominal wall region. The appearance is consistent with recent injection or other penetrating injury in this area causing minimal air and what most likely represents mild hematoma.   Electronically Signed   By: Bretta Bang M.D.   On: 04/21/2014 12:37    Scheduled Meds: . cefTRIAXone (ROCEPHIN)  IV  1 g Intravenous Q24H  . heparin  5,000 Units Subcutaneous 3 times per day   Continuous Infusions: . sodium chloride 125 mL/hr at 04/23/14 0759    Principal Problem:   Acute pyelonephritis Active Problems:   Abdominal pain   Hypokalemia   Sepsis    Time spent: 40 minutes   [facility]  [facility] Pager [phone]. If 8PM-8AM, please contact night-coverage at [email or web address], password [facility] 04/23/2014, 11:58 AM  LOS: 2 days

## 2014-04-24 ENCOUNTER — Inpatient Hospital Stay (HOSPITAL_COMMUNITY): Payer: Self-pay

## 2014-04-24 LAB — URINE CULTURE: Colony Count: >=100000

## 2014-04-24 LAB — COMPREHENSIVE METABOLIC PANEL
ALK PHOS: 120 U/L — AB (ref 39–117)
ALT: 20 U/L (ref 0–35)
AST: 17 U/L (ref 0–37)
Albumin: 2.6 g/dL — ABNORMAL LOW (ref 3.5–5.2)
Anion gap: 12 (ref 5–15)
BUN: 5 mg/dL — AB (ref 6–23)
CALCIUM: 8.3 mg/dL — AB (ref 8.4–10.5)
CO2: 20 mEq/L (ref 19–32)
Chloride: 109 mEq/L (ref 96–112)
Creatinine, Ser: 0.72 mg/dL (ref 0.50–1.10)
GLUCOSE: 101 mg/dL — AB (ref 70–99)
POTASSIUM: 3.6 meq/L — AB (ref 3.7–5.3)
Sodium: 141 mEq/L (ref 137–147)
Total Bilirubin: 0.5 mg/dL (ref 0.3–1.2)
Total Protein: 6 g/dL (ref 6.0–8.3)

## 2014-04-24 LAB — CBC
HEMATOCRIT: 25.1 % — AB (ref 36.0–46.0)
HEMOGLOBIN: 8.6 g/dL — AB (ref 12.0–15.0)
MCH: 27.9 pg (ref 26.0–34.0)
MCHC: 34.3 g/dL (ref 30.0–36.0)
MCV: 81.5 fL (ref 78.0–100.0)
Platelets: 252 10*3/uL (ref 150–400)
RBC: 3.08 MIL/uL — AB (ref 3.87–5.11)
RDW: 14.1 % (ref 11.5–15.5)
WBC: 5.5 10*3/uL (ref 4.0–10.5)

## 2014-04-24 MED ORDER — HYDROMORPHONE HCL PF 1 MG/ML IJ SOLN
1.0000 mg | Freq: Three times a day (TID) | INTRAMUSCULAR | Status: DC | PRN
  Administered 2014-04-24 – 2014-04-25 (×3): 1 mg via INTRAVENOUS
  Filled 2014-04-24 (×3): qty 1

## 2014-04-24 MED ORDER — KETOROLAC TROMETHAMINE 15 MG/ML IJ SOLN
30.0000 mg | Freq: Four times a day (QID) | INTRAMUSCULAR | Status: DC | PRN
  Administered 2014-04-24 – 2014-04-25 (×4): 30 mg via INTRAVENOUS
  Filled 2014-04-24 (×2): qty 2
  Filled 2014-04-24: qty 1
  Filled 2014-04-24 (×2): qty 2

## 2014-04-24 MED ORDER — CIPROFLOXACIN HCL 500 MG PO TABS
500.0000 mg | ORAL_TABLET | Freq: Two times a day (BID) | ORAL | Status: DC
  Administered 2014-04-24: 500 mg via ORAL
  Filled 2014-04-24 (×3): qty 1

## 2014-04-24 MED ORDER — POLYETHYLENE GLYCOL 3350 17 G PO PACK
17.0000 g | PACK | Freq: Every day | ORAL | Status: DC
  Administered 2014-04-24 – 2014-04-25 (×2): 17 g via ORAL
  Filled 2014-04-24 (×2): qty 1

## 2014-04-24 MED ORDER — CIPROFLOXACIN IN D5W 400 MG/200ML IV SOLN
400.0000 mg | Freq: Two times a day (BID) | INTRAVENOUS | Status: DC
  Administered 2014-04-24 – 2014-04-25 (×2): 400 mg via INTRAVENOUS
  Filled 2014-04-24 (×3): qty 200

## 2014-04-24 MED ORDER — ENSURE COMPLETE PO LIQD
237.0000 mL | Freq: Two times a day (BID) | ORAL | Status: DC
  Administered 2014-04-24 – 2014-04-25 (×2): 237 mL via ORAL

## 2014-04-24 NOTE — Progress Notes (Addendum)
[facility] PROGRESS NOTE  Karen Warren ZOX:[id number] DOB: Jan 31, 1986 DOA: 04/21/2014 PCP: Default, Provider, MD  Assessment/Plan: Principal Problem:   Acute pyelonephritis Active Problems:   Abdominal pain   Hypokalemia   Sepsis   *Acute pyelonephritis  -Urinalysis consistent with UTI secondary to Escherichia coli, with significant right flank pain.  -Urine and blood culture obtained, patient started on Rocephin, switched to oral ciprofloxacin yesterday, unable to tolerate due to nausea Therefore switched to IV ciprofloxacin today, change to oral when  nausea resolves  patient will need at least 14 days of antibiotics.  CT scan shows pyelonephritis in the right kidney, no renal abscess  WBC count down to normal   Diarrhea  C. difficile negative , no diarrhea, please refrain from giving Imodium, as patient now constipated, start the patient on MiraLAX for constipation now  Sepsis  -Patient needs sepsis criteria with heart rate of 119, WBC of 16.8 and presence of UTI/pyelonephritis.  -Aggressive hydration with IV fluids, continue antibiotics and supportive management.    Abdominal pain  -Secondary to acute UTI/pyelonephritis.  Minimize narcotics, DC Vicodin, change Dilaudid to every 8 hours Discussed this with patient Abdominal KUB to r/o ileus  Will add lipase to labs today If all work up negative then would suspect drug seeking behavior as her WBC count is down to normal and there would be no further indication for her continued stay in the hospital  Hypokalemia  -She tends to have low potassium, continue to monitor levels   Code Status: full  Family Communication: family updated about patient's clinical progress  Disposition Plan: Possible discharge tomorrow if feeling better   Brief narrative:  Karen Warren is a 28 y.o. female with no significant past medical history, status post cholecystectomy and appendectomy. Patient came in to the hospital with nausea,  vomiting and right flank pain. Patient denies burning while passing urine, upon further questioning she has some fever and chills as well. Patient had similar episode in 2013 where she end up diagnosed with bilateral acute pyelonephritis.  In the ED she had fever of 99.4, urinalysis is consistent with UTI, WBC of 16.8 patient admitted to the hospital for further evaluation and medical management.  Consultants:  None  Procedures:  None  Antibiotics:  rocephine     HPI/Subjective: Tearful today because of constipation and nausea, upset about receiving Imodium last night  Objective: Filed Vitals:   04/23/14 1349 04/23/14 2115 04/24/14 0525 04/24/14 1346  BP: 115/80 132/86 112/72 134/85  Pulse: 76 69 70 67  Temp: 98.2 F (36.8 C) 98.4 F (36.9 C) 98.2 F (36.8 C) 98.5 F (36.9 C)  TempSrc: Oral Oral Oral Oral  Resp: 18 18 16 16   Height:      Weight:      SpO2: 95% 100% 100% 98%    Intake/Output Summary (Last 24 hours) at 04/24/14 1544 Last data filed at 04/24/14 1441  Gross per 24 hour  Intake 2717.67 ml  Output    550 ml  Net 2167.67 ml    Exam:  HENT:  Head: Atraumatic.  Nose: Nose normal.  Mouth/Throat: Oropharynx is clear and moist.  Eyes: Conjunctivae are normal. Pupils are equal, round, and reactive to light. No scleral icterus.  Neck: Neck supple. No tracheal deviation present.  Cardiovascular: Normal rate, regular rhythm, normal heart sounds and intact distal pulses.  Pulmonary/Chest: Effort normal and breath sounds normal. No respiratory distress.  Abdominal: Soft. Normal appearance and bowel sounds are normal. She exhibits no  distension. There is no tenderness.  Musculoskeletal: She exhibits no edema and no tenderness.  Neurological: She is alert. No cranial nerve deficit.    Data Reviewed: Basic Metabolic Panel:  Recent Labs Lab 04/21/14 0502 04/22/14 0550 04/23/14 0556 04/24/14 0520  NA 138 138 137 141  K 3.0* 4.0 3.7 3.6*  CL 100 108 107 109   CO2 22 19 19 20   GLUCOSE 104* 87 84 101*  BUN 17 8 5* 5*  CREATININE 1.03 0.80 0.77 0.72  CALCIUM 9.2 8.2* 8.2* 8.3*  MG  --  2.5  --   --     Liver Function Tests:  Recent Labs Lab 04/21/14 0502 04/23/14 0556 04/24/14 0520  AST 20 13 17   ALT 35 19 20  ALKPHOS 115 104 120*  BILITOT 1.9* 0.7 0.5  PROT 7.5 6.1 6.0  ALBUMIN 3.7 2.6* 2.6*   No results found for this basename: LIPASE, AMYLASE,  in the last 168 hours No results found for this basename: AMMONIA,  in the last 168 hours  CBC:  Recent Labs Lab 04/21/14 0502 04/22/14 0550 04/23/14 0556 04/24/14 0520  WBC 16.8* 16.0* 8.5 5.5  NEUTROABS 14.6*  --   --   --   HGB 11.5* 9.3* 8.6* 8.6*  HCT 34.2* 27.1* 25.1* 25.1*  MCV 83.8 82.9 83.9 81.5  PLT 244 211 207 252    Cardiac Enzymes: No results found for this basename: CKTOTAL, CKMB, CKMBINDEX, TROPONINI,  in the last 168 hours BNP (last 3 results) No results found for this basename: PROBNP,  in the last 8760 hours   CBG: No results found for this basename: GLUCAP,  in the last 168 hours  Recent Results (from the past 240 hour(s))  CULTURE, BLOOD (ROUTINE X 2)     Status: None   Collection Time    04/21/14  5:02 AM      Result Value Ref Range Status   Specimen Description BLOOD RIGHT HAND   Final   Special Requests BOTTLES DRAWN AEROBIC AND ANAEROBIC 5 CC EACH   Final   Culture  Setup Time     Final   Value: 04/21/2014 08:54     Performed at [facility]   Culture     Final   Value:        BLOOD CULTURE RECEIVED NO GROWTH TO DATE CULTURE WILL BE HELD FOR 5 DAYS BEFORE ISSUING A FINAL NEGATIVE REPORT     Performed at [facility]   Report Status PENDING   Incomplete  CULTURE, BLOOD (ROUTINE X 2)     Status: None   Collection Time    04/21/14  5:11 AM      Result Value Ref Range Status   Specimen Description BLOOD RIGHT ARM   Final   Special Requests BOTTLES DRAWN AEROBIC AND ANAEROBIC 5 CC EACH   Final   Culture  Setup Time     Final    Value: 04/21/2014 08:54     Performed at [facility]   Culture     Final   Value:        BLOOD CULTURE RECEIVED NO GROWTH TO DATE CULTURE WILL BE HELD FOR 5 DAYS BEFORE ISSUING A FINAL NEGATIVE REPORT     Performed at [facility]   Report Status PENDING   Incomplete  URINE CULTURE     Status: None   Collection Time    04/21/14  5:25 AM      Result  Value Ref Range Status   Specimen Description URINE, CLEAN CATCH   Final   Special Requests NONE   Final   Culture  Setup Time     Final   Value: 04/21/2014 09:17     Performed at [facility]   Colony Count     Final   Value: >=100,000 COLONIES/ML     Performed at [facility]   Culture     Final   Value: ESCHERICHIA COLI     Performed at [facility]   Report Status 04/24/2014 FINAL   Final   Organism ID, Bacteria ESCHERICHIA COLI   Final  CLOSTRIDIUM DIFFICILE BY PCR     Status: None   Collection Time    04/22/14  6:37 PM      Result Value Ref Range Status   C difficile by pcr NEGATIVE  NEGATIVE Final   Comment: [facility]     Studies: Ct Abdomen Pelvis W Contrast  04/21/2014   CLINICAL DATA:  Flank pain with nausea and vomiting  EXAM: CT ABDOMEN AND PELVIS WITH CONTRAST  TECHNIQUE: Multidetector CT imaging of the abdomen and pelvis was performed using the standard protocol following bolus administration of intravenous contrast. Oral contrast was also administered.  CONTRAST:  100mL OMNIPAQUE IOHEXOL 300 MG/ML  SOLN  COMPARISON:  June 12, 2012  FINDINGS: There is mild bibasilar lung atelectatic change.  Liver is enlarged, measuring 20.8 cm in length. There is fatty infiltration in the liver. No focal liver masses are appreciable. Gallbladder is absent. There is no biliary duct dilatation.  Spleen, pancreas, and adrenals appear normal. The right kidney appears slightly edematous with mild perinephric fluid. Similar changes are not seen in the left kidney. There is  minimal scarring in the left kidney anteriorly in an area of previous pyelonephritis. There is decreased attenuation in the mid and upper posterior right kidney compared to the left. There is no well-defined renal mass in either kidney. There is no hydronephrosis or calculus in either kidney. There is no ureteral calculus on either side.  In the pelvis, there is a cyst arising from the left ovary measuring 4.5 by 4.0 cm. There is moderate pelvic ascites. No other pelvic mass is seen. Appendix appears normal.  There is a small amount of increased attenuation in the anterior lower abdominal wall with minimal air in this area.  There is no bowel obstruction. No free air or portal venous air. There is no adenopathy or abscess in the abdomen or pelvis. There is no abdominal aortic aneurysm. There are no blastic or lytic bone lesions.  IMPRESSION: Findings felt to be consistent with pyelonephritis in the right kidney. A well-defined renal abscess is not seen. Close clinical and imaging surveillance of the right kidney may well be advised given the findings currently present involving the right kidney, however. Note that there is no hydronephrosis or calculus in either kidney or ureter.  There is a left ovarian cyst with moderate surrounding ascites. Suspect recent ovarian cyst leakage/ rupture.  Appendix appears normal.  No bowel obstruction.  No abscess.  Liver enlarged with fatty infiltration.  Increased attenuation with minimal air in the anterior lower abdominal wall region. The appearance is consistent with recent injection or other penetrating injury in this area causing minimal air and what most likely represents mild hematoma.   Electronically Signed   By: Bretta BangWilliam  Woodruff M.D.   On: 04/21/2014 12:37    Scheduled Meds: . ciprofloxacin  400 mg Intravenous Q12H  . feeding supplement (ENSURE COMPLETE)  237 mL Oral BID BM  . heparin  5,000 Units Subcutaneous 3 times per day  . polyethylene glycol  17 g Oral  Daily   Continuous Infusions: . sodium chloride 125 mL/hr at 04/24/14 0756    Principal Problem:   Acute pyelonephritis Active Problems:   Abdominal pain   Hypokalemia   Sepsis    Time spent: 40 minutes   [facility]  [facility] Pager [phone]. If 8PM-8AM, please contact night-coverage at [email or web address], password [facility] 04/24/2014, 3:44 PM  LOS: 3 days

## 2014-04-25 LAB — LIPASE, BLOOD: Lipase: 10 U/L — ABNORMAL LOW (ref 11–59)

## 2014-04-25 MED ORDER — CIPROFLOXACIN HCL 500 MG PO TABS: 500.0000 mg | ORAL_TABLET | Freq: Two times a day (BID) | ORAL | Status: DC

## 2014-04-25 MED ORDER — OXYCODONE HCL 5 MG PO CAPS: 5.0000 mg | ORAL_CAPSULE | Freq: Four times a day (QID) | ORAL | Status: DC | PRN

## 2014-04-25 MED ORDER — PROCHLORPERAZINE 25 MG RE SUPP: 25.0000 mg | Freq: Two times a day (BID) | RECTAL | Status: DC | PRN

## 2014-04-25 NOTE — Progress Notes (Signed)
Patient given discharge instructions, and verbalized an understanding of all discharge instructions.  Patient agrees with discharge plan, and is being discharged in stable medical condition.  Patient ambulated in hall, and was able to tolerate ensure and a few bites of a sandwich for lunch.  Patient given transportation via wheelchair.  Philomena Dohenyavid Nylah Butkus RN

## 2014-04-25 NOTE — Discharge Summary (Signed)
Physician Discharge Summary  Karen Warren MRN: [id number] DOB/AGE: 1986-07-16 28 y.o.  PCP: Default, Provider, MD   Admit date: 04/21/2014 Discharge date: 04/25/2014  Discharge Diagnoses:      Acute pyelonephritis Active Problems:   Abdominal pain   Hypokalemia   Sepsis  Follow up recommendations Follow up with PCP in 5-7 days BMP in one week    Medication List         acetaminophen 500 MG tablet  Commonly known as:  TYLENOL  Take 1,000 mg by mouth every 6 (six) hours as needed for mild pain.     ciprofloxacin 500 MG tablet  Commonly known as:  CIPRO  Take 1 tablet (500 mg total) by mouth 2 (two) times daily.     oxycodone 5 MG capsule  Commonly known as:  OXY-IR  Take 1 capsule (5 mg total) by mouth every 6 (six) hours as needed.     prochlorperazine 25 MG suppository  Commonly known as:  COMPAZINE  Place 1 suppository (25 mg total) rectally every 12 (twelve) hours as needed for nausea or vomiting.        Discharge Condition:   Disposition: 01-Home or Self Care   Consults:    Significant Diagnostic Studies: Dg Abd 1 View  04/24/2014   CLINICAL DATA:  Severe abdominal pain, constipation, pyelonephritis  EXAM: ABDOMEN - 1 VIEW  COMPARISON:  None.  FINDINGS: There are gas-filled loops of large and small bowel which are not distended. There is gas the rectum. No organomegaly. No pathologic calcifications. Cholecystectomy clips noted.  IMPRESSION: No abnormal abdominal findings by radiography.   Electronically Signed   By: Suzy Bouchard M.D.   On: 04/24/2014 17:37   Ct Abdomen Pelvis W Contrast  04/21/2014   CLINICAL DATA:  Flank pain with nausea and vomiting  EXAM: CT ABDOMEN AND PELVIS WITH CONTRAST  TECHNIQUE: Multidetector CT imaging of the abdomen and pelvis was performed using the standard protocol following bolus administration of intravenous contrast. Oral contrast was also administered.  CONTRAST:  167m OMNIPAQUE IOHEXOL 300 MG/ML  SOLN   COMPARISON:  June 12, 2012  FINDINGS: There is mild bibasilar lung atelectatic change.  Liver is enlarged, measuring 20.8 cm in length. There is fatty infiltration in the liver. No focal liver masses are appreciable. Gallbladder is absent. There is no biliary duct dilatation.  Spleen, pancreas, and adrenals appear normal. The right kidney appears slightly edematous with mild perinephric fluid. Similar changes are not seen in the left kidney. There is minimal scarring in the left kidney anteriorly in an area of previous pyelonephritis. There is decreased attenuation in the mid and upper posterior right kidney compared to the left. There is no well-defined renal mass in either kidney. There is no hydronephrosis or calculus in either kidney. There is no ureteral calculus on either side.  In the pelvis, there is a cyst arising from the left ovary measuring 4.5 by 4.0 cm. There is moderate pelvic ascites. No other pelvic mass is seen. Appendix appears normal.  There is a small amount of increased attenuation in the anterior lower abdominal wall with minimal air in this area.  There is no bowel obstruction. No free air or portal venous air. There is no adenopathy or abscess in the abdomen or pelvis. There is no abdominal aortic aneurysm. There are no blastic or lytic bone lesions.  IMPRESSION: Findings felt to be consistent with pyelonephritis in the right kidney. A well-defined renal abscess is not seen.  Close clinical and imaging surveillance of the right kidney may well be advised given the findings currently present involving the right kidney, however. Note that there is no hydronephrosis or calculus in either kidney or ureter.  There is a left ovarian cyst with moderate surrounding ascites. Suspect recent ovarian cyst leakage/ rupture.  Appendix appears normal.  No bowel obstruction.  No abscess.  Liver enlarged with fatty infiltration.  Increased attenuation with minimal air in the anterior lower abdominal wall  region. The appearance is consistent with recent injection or other penetrating injury in this area causing minimal air and what most likely represents mild hematoma.   Electronically Signed   By: Lowella Grip M.D.   On: 04/21/2014 12:37      Microbiology: Recent Results (from the past 240 hour(s))  CULTURE, BLOOD (ROUTINE X 2)     Status: None   Collection Time    04/21/14  5:02 AM      Result Value Ref Range Status   Specimen Description BLOOD RIGHT HAND   Final   Special Requests BOTTLES DRAWN AEROBIC AND ANAEROBIC 5 CC EACH   Final   Culture  Setup Time     Final   Value: 04/21/2014 08:54     Performed at [facility]   Culture     Final   Value:        BLOOD CULTURE RECEIVED NO GROWTH TO DATE CULTURE WILL BE HELD FOR 5 DAYS BEFORE ISSUING A FINAL NEGATIVE REPORT     Performed at [facility]   Report Status PENDING   Incomplete  CULTURE, BLOOD (ROUTINE X 2)     Status: None   Collection Time    04/21/14  5:11 AM      Result Value Ref Range Status   Specimen Description BLOOD RIGHT ARM   Final   Special Requests BOTTLES DRAWN AEROBIC AND ANAEROBIC 5 CC EACH   Final   Culture  Setup Time     Final   Value: 04/21/2014 08:54     Performed at [facility]   Culture     Final   Value:        BLOOD CULTURE RECEIVED NO GROWTH TO DATE CULTURE WILL BE HELD FOR 5 DAYS BEFORE ISSUING A FINAL NEGATIVE REPORT     Performed at [facility]   Report Status PENDING   Incomplete  URINE CULTURE     Status: None   Collection Time    04/21/14  5:25 AM      Result Value Ref Range Status   Specimen Description URINE, CLEAN CATCH   Final   Special Requests NONE   Final   Culture  Setup Time     Final   Value: 04/21/2014 09:17     Performed at [facility]     Final   Value: >=100,000 COLONIES/ML     Performed at [facility]   Culture     Final   Value: ESCHERICHIA COLI     Performed at [facility]   Report  Status 04/24/2014 FINAL   Final   Organism ID, Bacteria ESCHERICHIA COLI   Final  CLOSTRIDIUM DIFFICILE BY PCR     Status: None   Collection Time    04/22/14  6:37 PM      Result Value Ref Range Status   C difficile by pcr NEGATIVE  NEGATIVE Final   Comment: Performed at [facility]  Hospital     Labs: Results for orders placed during the hospital encounter of 04/21/14 (from the past 48 hour(s))  CBC     Status: Abnormal   Collection Time    04/24/14  5:20 AM      Result Value Ref Range   WBC 5.5  4.0 - 10.5 K/uL   RBC 3.08 (*) 3.87 - 5.11 MIL/uL   Hemoglobin 8.6 (*) 12.0 - 15.0 g/dL   HCT 25.1 (*) 36.0 - 46.0 %   MCV 81.5  78.0 - 100.0 fL   MCH 27.9  26.0 - 34.0 pg   MCHC 34.3  30.0 - 36.0 g/dL   RDW 14.1  11.5 - 15.5 %   Platelets 252  150 - 400 K/uL  COMPREHENSIVE METABOLIC PANEL     Status: Abnormal   Collection Time    04/24/14  5:20 AM      Result Value Ref Range   Sodium 141  137 - 147 mEq/L   Potassium 3.6 (*) 3.7 - 5.3 mEq/L   Chloride 109  96 - 112 mEq/L   CO2 20  19 - 32 mEq/L   Glucose, Bld 101 (*) 70 - 99 mg/dL   BUN 5 (*) 6 - 23 mg/dL   Creatinine, Ser 0.72  0.50 - 1.10 mg/dL   Calcium 8.3 (*) 8.4 - 10.5 mg/dL   Total Protein 6.0  6.0 - 8.3 g/dL   Albumin 2.6 (*) 3.5 - 5.2 g/dL   AST 17  0 - 37 U/L   ALT 20  0 - 35 U/L   Alkaline Phosphatase 120 (*) 39 - 117 U/L   Total Bilirubin 0.5  0.3 - 1.2 mg/dL   GFR calc non Af Amer >90  >90 mL/min   GFR calc Af Amer >90  >90 mL/min   Comment: (NOTE)     The eGFR has been calculated using the CKD EPI equation.     This calculation has not been validated in all clinical situations.     eGFR's persistently <90 mL/min signify possible Chronic Kidney     Disease.   Anion gap 12  5 - 15  LIPASE, BLOOD     Status: Abnormal   Collection Time    04/25/14  8:40 AM      Result Value Ref Range   Lipase 10 (*) 11 - 59 U/L     HPI :ILISHA BLUST is a 28 y.o. female with no significant past medical history, status  post cholecystectomy and appendectomy. Patient came in to the hospital with nausea, vomiting and right flank pain.Patient had similar episode in 2013 where she end up diagnosed with bilateral acute pyelonephritis.  In the ED she had fever of 99.4, urinalysis is consistent with UTI, WBC of 16.8 patient admitted to the hospital for further evaluation and medical management.  CT abdomen pelvis showed pyelonephritis in the right  kidney. A well-defined renal abscess is not seen   HOSPITAL COURSE:  *Acute pyelonephritis  -Urinalysis consistent with UTI secondary to  pansensitiveEscherichia coli, with significant right flank pain.   patient started on Rocephin, switched to oral ciprofloxacin yesterday, unable to tolerate due to nausea  Therefore switched to IV ciprofloxacin today, During this hospitalization the patient had complained of significant abdominal pain out of proportion to lab findings and imaging studies After repeat abdominal KUB and assessment of her liver function and pancreatic function, patient strongly advised to minimize narcotics WBC count has been normal for 2 days prior  to discharge She will continue with oral ciprofloxacin for another 10 days  Diarrhea  C. difficile negative , diarrhea alternating with constipation, suspect IBS, Patient may benefit from bentyl long-term   Sirs Secondary to UTI/pyelonephritis.  -Started on Aggressive hydration with IV fluids,  Improved   Abdominal pain  -Secondary to acute UTI/pyelonephritis.  We have to cut back on her narcotic regimen, patient kept giving a history of diarrhea alternating with constipation, suspect IBS Discussed this with patient to minimize narcotic medications Abdominal KUB negative for ileus  Lipase was normal    Hypokalemia  -She tends to have low potassium, repeat BMP in one week     Discharge Exam:   Blood pressure 139/81, pulse 64, temperature 98.5 F (36.9 C), temperature source Oral, resp. rate  18, height 5' 2"  (1.575 m), weight 76.6 kg (168 lb 14 oz), last menstrual period 04/05/2014, SpO2 100.00%.  Cardiovascular: Normal rate, regular rhythm, normal heart sounds and intact distal pulses.  Pulmonary/Chest: Effort normal and breath sounds normal. No respiratory distress.  Abdominal: Soft. Normal appearance and bowel sounds are normal. She exhibits no distension. There is no tenderness.  Musculoskeletal: She exhibits no edema and no tenderness.  Neurological: She is alert. No cranial nerve deficit.          Discharge Instructions   Diet - low sodium heart healthy    Complete by:  As directed      Increase activity slowly    Complete by:  As directed            Follow-up Information   Follow up with PCP. Schedule an appointment as soon as possible for a visit in 1 week.      SignedReyne Dumas 04/25/2014, 11:55 AM

## 2014-04-27 LAB — CULTURE, BLOOD (ROUTINE X 2)
CULTURE: NO GROWTH
Culture: NO GROWTH

## 2014-06-12 ENCOUNTER — Encounter (HOSPITAL_COMMUNITY): Payer: Self-pay | Admitting: Emergency Medicine

## 2014-06-12 ENCOUNTER — Emergency Department (HOSPITAL_COMMUNITY): Payer: Self-pay

## 2014-06-12 ENCOUNTER — Emergency Department (HOSPITAL_COMMUNITY)
Admission: EM | Admit: 2014-06-12 | Discharge: 2014-06-12 | Disposition: A | Discharge status: Home / Self Care | Payer: Self-pay | Attending: Emergency Medicine | Admitting: Emergency Medicine

## 2014-06-12 DIAGNOSIS — M25532 Pain in left wrist: Secondary | ICD-10-CM

## 2014-06-12 DIAGNOSIS — S6990XA Unspecified injury of unspecified wrist, hand and finger(s), initial encounter: Secondary | ICD-10-CM | POA: Insufficient documentation

## 2014-06-12 DIAGNOSIS — S59909A Unspecified injury of unspecified elbow, initial encounter: Secondary | ICD-10-CM | POA: Insufficient documentation

## 2014-06-12 DIAGNOSIS — Y9389 Activity, other specified: Secondary | ICD-10-CM | POA: Insufficient documentation

## 2014-06-12 DIAGNOSIS — W2209XA Striking against other stationary object, initial encounter: Secondary | ICD-10-CM | POA: Insufficient documentation

## 2014-06-12 DIAGNOSIS — M25531 Pain in right wrist: Secondary | ICD-10-CM

## 2014-06-12 DIAGNOSIS — Y9289 Other specified places as the place of occurrence of the external cause: Secondary | ICD-10-CM | POA: Insufficient documentation

## 2014-06-12 DIAGNOSIS — S59919A Unspecified injury of unspecified forearm, initial encounter: Secondary | ICD-10-CM

## 2014-06-12 DIAGNOSIS — M79641 Pain in right hand: Secondary | ICD-10-CM

## 2014-06-12 DIAGNOSIS — F17200 Nicotine dependence, unspecified, uncomplicated: Secondary | ICD-10-CM | POA: Insufficient documentation

## 2014-06-12 DIAGNOSIS — Z792 Long term (current) use of antibiotics: Secondary | ICD-10-CM | POA: Insufficient documentation

## 2014-06-12 DIAGNOSIS — M79642 Pain in left hand: Secondary | ICD-10-CM

## 2014-06-12 MED ORDER — TRAMADOL HCL 50 MG PO TABS
50.0000 mg | ORAL_TABLET | Freq: Four times a day (QID) | ORAL | Status: DC | PRN
Start: 2014-06-12 — End: 2015-09-04

## 2014-06-12 MED ORDER — OXYCODONE-ACETAMINOPHEN 5-325 MG PO TABS
1.0000 | ORAL_TABLET | Freq: Once | ORAL | Status: AC
  Administered 2014-06-12: 1 via ORAL
  Filled 2014-06-12: qty 1

## 2014-06-12 MED ORDER — IBUPROFEN 600 MG PO TABS: 600.0000 mg | ORAL_TABLET | Freq: Four times a day (QID) | ORAL | Status: DC | PRN

## 2014-06-12 MED ORDER — IBUPROFEN 200 MG PO TABS
600.0000 mg | ORAL_TABLET | Freq: Once | ORAL | Status: AC
  Administered 2014-06-12: 600 mg via ORAL
  Filled 2014-06-12: qty 3

## 2014-06-12 NOTE — ED Provider Notes (Signed)
CSN: [id number]     Arrival date & time 06/12/14  1702 History  This chart was scribed for non-physician practitioner working with Ward Givens, MD by Elveria Rising, ED Scribe. This patient was seen in room WTR5/WTR5 and the patient's care was started at 6:22 PM.   Chief Complaint  Patient presents with  . Hand Injury   The history is provided by the patient. No language interpreter was used.   HPI Comments: Karen Warren is a 28 y.o. female brought in by ambulance, who presents to the Emergency Department with a bilateral hand injury after punching a wall two hours ago. Patient reports overwhelming stress from school and missing a court appearance today that escalated to this outburst in anger.  Patient reports punching the wall with both hands several times. She however reports greater pain in her right hand and pain in her left wrist. Patient presents with swelling and abrasions on her right knuckles.  Patient is right hand dominant.   Patient reports having a "breakdown" and wanting to set up an appointment at [facility] for anxiety.    History reviewed. No pertinent past medical history. Past Surgical History  Procedure Laterality Date  . Cholecystectomy    . Cesarean section    . Tubal ligation     Family History  Problem Relation Age of Onset  . Hypertension Mother   . Hypertension Maternal Grandmother   . Diabetes Maternal Grandmother    History  Substance Use Topics  . Smoking status: Current Every Day Smoker -- 0.30 packs/day    Types: Cigarettes  . Smokeless tobacco: Never Used  . Alcohol Use: No   OB History   Grav Para Term Preterm Abortions TAB SAB Ect Mult Living                 Review of Systems  Constitutional: Negative for activity change.  Musculoskeletal: Positive for arthralgias. Negative for back pain, joint swelling and neck pain.  Skin: Negative for wound.  Neurological: Negative for weakness and numbness.    Allergies  Review of patient's  allergies indicates no known allergies.  Home Medications   Prior to Admission medications   Medication Sig Start Date End Date Taking? Authorizing Provider  acetaminophen (TYLENOL) 500 MG tablet Take 1,000 mg by mouth every 6 (six) hours as needed for mild pain.    Historical Provider, MD  ciprofloxacin (CIPRO) 500 MG tablet Take 1 tablet (500 mg total) by mouth 2 (two) times daily. 04/25/14   Richarda Overlie, MD  oxycodone (OXY-IR) 5 MG capsule Take 1 capsule (5 mg total) by mouth every 6 (six) hours as needed. 04/25/14   Richarda Overlie, MD  prochlorperazine (COMPAZINE) 25 MG suppository Place 1 suppository (25 mg total) rectally every 12 (twelve) hours as needed for nausea or vomiting. 04/25/14   Richarda Overlie, MD   Triage Vitals: BP 130/77  Pulse 83  Temp(Src) 98.2 F (36.8 C) (Oral)  Resp 20  SpO2 100%  Physical Exam  Nursing note and vitals reviewed. Constitutional: She appears well-developed and well-nourished.  HENT:  Head: Normocephalic and atraumatic.  Eyes: EOM are normal. Pupils are equal, round, and reactive to light.  Neck: Normal range of motion. Neck supple.  Cardiovascular: Normal rate.  Exam reveals no decreased pulses.   Pulmonary/Chest: Effort normal.  Musculoskeletal: She exhibits tenderness. She exhibits no edema.       Right elbow: Normal.      Left elbow: Normal.  Right wrist: She exhibits decreased range of motion, tenderness and bony tenderness. She exhibits no swelling.       Left wrist: She exhibits decreased range of motion, tenderness and bony tenderness. She exhibits no swelling.       Right forearm: She exhibits tenderness. She exhibits no bony tenderness and no swelling.       Left forearm: She exhibits tenderness. She exhibits no bony tenderness and no swelling.       Right hand: She exhibits decreased range of motion and tenderness. She exhibits normal capillary refill and no deformity. Normal sensation noted. Decreased strength: unable to test.        Left hand: She exhibits decreased range of motion and tenderness. She exhibits normal capillary refill and no deformity. Normal sensation noted. Decreased strength: unable to test.  L hand/wrist: multiple superficial abrasions. No anatomic stuffbox tenderness.   R hand/wrist: No anatomic stuffbox tenderness.    Neurological: She is alert. No sensory deficit.  Motor, sensation, and vascular distal to the injury is fully intact.   Skin: Skin is warm and dry.  Psychiatric: She has a normal mood and affect. Her behavior is normal.    ED Course  Procedures (including critical care time)  COORDINATION OF CARE: 6:25 PM- Discussed treatment plan with patient at bedside and patient agreed to plan.   Labs Review Labs Reviewed - No data to display  Imaging Review Dg Hand Complete Right  06/12/2014   CLINICAL DATA:  Punched wall.  Right hand pain.  EXAM: RIGHT HAND - COMPLETE 3+ VIEW  COMPARISON:  None.  FINDINGS: There is no evidence of fracture or dislocation. There is no evidence of arthropathy or other focal bone abnormality. Soft tissues are unremarkable.  IMPRESSION: Negative.   Electronically Signed   By: Amie Portland M.D.   On: 06/12/2014 18:19     EKG Interpretation None      Vital signs reviewed and are as follows: Filed Vitals:   06/12/14 1711  BP: 130/77  Pulse: 83  Temp: 98.2 F (36.8 C)  Resp: 20   X-rays neg. Wound care performed. Bilateral velcro wrist splints placed.   Patient was counseled on RICE protocol and told to rest injury, use ice for no longer than 15 minutes every hour, compress the area, and elevate above the level of their heart as much as possible to reduce swelling. Questions answered. Patient verbalized understanding.    PCP referral given.   MDM   Final diagnoses:  Bilateral wrist pain  Bilateral hand pain   Patient with bilateral wrist and hand injuries from punching wall. Imaging is negative. Wrist splint given. No anatomic snuffbox  tenderness bilaterally and I do not suspect navicular injury. Hand and fingers are neurovascularly intact. Rice protocol and conservative management indicated with return to PCP if symptoms are not improved in one week.  I personally performed the services described in this documentation, which was scribed in my presence. The recorded information has been reviewed and is accurate.    Renne Crigler, PA-C 06/12/14 1955

## 2014-06-12 NOTE — ED Notes (Signed)
Per EMS pt comes from home c/o right hand after she punched a wall due to stress of school, and missing a court appearance today.

## 2014-06-12 NOTE — ED Provider Notes (Signed)
Medical screening examination/treatment/procedure(s) were performed by non-physician practitioner and as supervising physician I was immediately available for consultation/collaboration.   EKG Interpretation None      Tarini Carrier, MD, FACEP   Markiya Keefe L Ellianne Gowen, MD 06/12/14 2259 

## 2014-06-12 NOTE — Progress Notes (Signed)
  CARE MANAGEMENT ED NOTE 06/12/2014  Patient:  Karen Warren, Karen Warren   Account Number:  [id number]  Date Initiated:  06/12/2014  Documentation initiated by:  Edd Arbour  Subjective/Objective Assessment:   28 yr old self [place] pt comes from home c/o right hand after she punched a wall due to stress of school, and missing a court appearance today     Subjective/Objective Assessment Detail:   no pcp listed     Action/Plan:   Spoke with pt and provided a list of [facility] and self providers to assist with f/u care after d/c charge   Action/Plan Detail:   Anticipated DC Date:       Status Recommendation to Physician:   Result of Recommendation:    Other ED Services  Consult Working Psychologist, educational  Other  Outpatient Services - Pt will follow up  PCP issues    Choice offered to / List presented to:            Status of service:  Completed, signed off  ED Comments:   ED Comments Detail:

## 2014-06-12 NOTE — Discharge Instructions (Signed)
Please read and follow all provided instructions.  Your diagnoses today include:  1. Bilateral wrist pain   2. Bilateral hand pain     Tests performed today include:  An x-ray of the affected areas - do NOT show any broken bones  Vital signs. See below for your results today.   Medications prescribed:   Tramadol - narcotic-like pain medication  DO NOT drive or perform any activities that require you to be awake and alert because this medicine can make you drowsy.    Ibuprofen (Motrin, Advil) - anti-inflammatory pain medication  Do not exceed  ibuprofen every 6 hours, take with food  You have been prescribed an anti-inflammatory medication or NSAID. Take with food. Take smallest effective dose for the shortest duration needed for your pain. Stop taking if you experience stomach pain or vomiting.   Take any prescribed medications only as directed.  Home care instructions:   Follow any educational materials contained in this packet  Follow R.I.C.E. Protocol:  R - rest your injury   I  - use ice on injury without applying directly to skin  C - compress injury with bandage or splint  E - elevate the injury as much as possible  Follow-up instructions: Please follow-up with your primary care provider if you continue to have significant pain in 1 week. In this case you may have a severe injury that requires further care.   Return instructions:   Please return if your toes are numb or tingling, appear gray or blue, or you have severe pain (also elevate leg and loosen splint or wrap if you were given one)  Please return to the Emergency Department if you experience worsening symptoms.   Please return if you have any other emergent concerns.  Additional Information:  Your vital signs today were: BP 130/77   Pulse 83   Temp(Src) 98.2 F (36.8 C) (Oral)   Resp 20   SpO2 100%   LMP 06/04/2014 If your blood pressure (BP) was elevated above 135/85 this visit, please  have this repeated by your doctor within one month. --------------

## 2014-10-15 ENCOUNTER — Emergency Department (HOSPITAL_COMMUNITY): Payer: Self-pay

## 2014-10-15 ENCOUNTER — Emergency Department (HOSPITAL_COMMUNITY)
Admission: EM | Admit: 2014-10-15 | Discharge: 2014-10-15 | Disposition: A | Discharge status: Home / Self Care | Payer: Self-pay | Attending: Emergency Medicine | Admitting: Emergency Medicine

## 2014-10-15 ENCOUNTER — Encounter (HOSPITAL_COMMUNITY): Payer: Self-pay | Admitting: Emergency Medicine

## 2014-10-15 DIAGNOSIS — S199XXA Unspecified injury of neck, initial encounter: Secondary | ICD-10-CM | POA: Insufficient documentation

## 2014-10-15 DIAGNOSIS — Z791 Long term (current) use of non-steroidal anti-inflammatories (NSAID): Secondary | ICD-10-CM | POA: Insufficient documentation

## 2014-10-15 DIAGNOSIS — M542 Cervicalgia: Secondary | ICD-10-CM

## 2014-10-15 DIAGNOSIS — Z87448 Personal history of other diseases of urinary system: Secondary | ICD-10-CM | POA: Insufficient documentation

## 2014-10-15 DIAGNOSIS — Y998 Other external cause status: Secondary | ICD-10-CM | POA: Insufficient documentation

## 2014-10-15 DIAGNOSIS — Y9389 Activity, other specified: Secondary | ICD-10-CM | POA: Insufficient documentation

## 2014-10-15 DIAGNOSIS — S99921A Unspecified injury of right foot, initial encounter: Secondary | ICD-10-CM | POA: Insufficient documentation

## 2014-10-15 DIAGNOSIS — M79671 Pain in right foot: Secondary | ICD-10-CM

## 2014-10-15 DIAGNOSIS — S3992XA Unspecified injury of lower back, initial encounter: Secondary | ICD-10-CM | POA: Insufficient documentation

## 2014-10-15 DIAGNOSIS — S3991XA Unspecified injury of abdomen, initial encounter: Secondary | ICD-10-CM | POA: Insufficient documentation

## 2014-10-15 DIAGNOSIS — Y9289 Other specified places as the place of occurrence of the external cause: Secondary | ICD-10-CM | POA: Insufficient documentation

## 2014-10-15 DIAGNOSIS — Z720 Tobacco use: Secondary | ICD-10-CM | POA: Insufficient documentation

## 2014-10-15 DIAGNOSIS — M549 Dorsalgia, unspecified: Secondary | ICD-10-CM

## 2014-10-15 HISTORY — DX: Disorder of kidney and ureter, unspecified: N28.9

## 2014-10-15 LAB — URINALYSIS, ROUTINE W REFLEX MICROSCOPIC
Bilirubin Urine: NEGATIVE
GLUCOSE, UA: NEGATIVE mg/dL
Hgb urine dipstick: NEGATIVE
Ketones, ur: NEGATIVE mg/dL
LEUKOCYTES UA: NEGATIVE
Nitrite: NEGATIVE
PH: 7 (ref 5.0–8.0)
Protein, ur: NEGATIVE mg/dL
Specific Gravity, Urine: 1.022 (ref 1.005–1.030)
Urobilinogen, UA: 0.2 mg/dL (ref 0.0–1.0)

## 2014-10-15 MED ORDER — DIAZEPAM 5 MG PO TABS: 5.0000 mg | ORAL_TABLET | Freq: Three times a day (TID) | ORAL | Status: DC | PRN

## 2014-10-15 MED ORDER — HYDROCODONE-ACETAMINOPHEN 5-325 MG PO TABS
1.0000 | ORAL_TABLET | ORAL | Status: DC | PRN
Start: 2014-10-15 — End: 2015-09-04

## 2014-10-15 MED ORDER — HYDROCODONE-ACETAMINOPHEN 5-325 MG PO TABS
1.0000 | ORAL_TABLET | Freq: Once | ORAL | Status: AC
  Administered 2014-10-15: 1 via ORAL
  Filled 2014-10-15: qty 1

## 2014-10-15 NOTE — Discharge Instructions (Signed)
Read the information below.  Use the prescribed medication as directed.  Please discuss all new medications with your pharmacist.  Do not take additional tylenol while taking the prescribed pain medication to avoid overdose.  You may return to the Emergency Department at any time for worsening condition or any new symptoms that concern you.  If there is any possibility that you might be pregnant, please let your health care provider know and discuss this with the pharmacist to ensure medication safety.   If you develop fevers, loss of control of bowel or bladder, weakness or numbness in your legs, or are unable to walk, return to the ER for a recheck.    Assault, General Assault includes any behavior, whether intentional or reckless, which results in bodily injury to another person and/or damage to property. Included in this would be any behavior, intentional or reckless, that by its nature would be understood (interpreted) by a reasonable person as intent to harm another person or to damage his/her property. Threats may be oral or written. They may be communicated through regular mail, computer, fax, or phone. These threats may be direct or implied. FORMS OF ASSAULT INCLUDE:  Physically assaulting a person. This includes physical threats to inflict physical harm as well as:  Slapping.  Hitting.  Poking.  Kicking.  Punching.  Pushing.  Arson.  Sabotage.  Equipment vandalism.  Damaging or destroying property.  Throwing or hitting objects.  Displaying a weapon or an object that appears to be a weapon in a threatening manner.  Carrying a firearm of any kind.  Using a weapon to harm someone.  Using greater physical size/strength to intimidate another.  Making intimidating or threatening gestures.  Bullying.  Hazing.  Intimidating, threatening, hostile, or abusive language directed toward another person.  It communicates the intention to engage in violence against that  person. And it leads a reasonable person to expect that violent behavior may occur.  Stalking another person. IF IT HAPPENS AGAIN:  Immediately call for emergency help (911 in [place]  If someone poses clear and immediate danger to you, seek legal authorities to have a protective or restraining order put in place.  Less threatening assaults can at least be reported to authorities. STEPS TO TAKE IF A SEXUAL ASSAULT HAS HAPPENED  Go to an area of safety. This may include a shelter or staying with a friend. Stay away from the area where you have been attacked. A large percentage of sexual assaults are caused by a friend, relative or associate.  If medications were given by your caregiver, take them as directed for the full length of time prescribed.  Only take over-the-counter or prescription medicines for pain, discomfort, or fever as directed by your caregiver.  If you have come in contact with a sexual disease, find out if you are to be tested again. If your caregiver is concerned about the HIV/AIDS virus, he/she may require you to have continued testing for several months.  For the protection of your privacy, test results can not be given over the phone. Make sure you receive the results of your test. If your test results are not back during your visit, make an appointment with your caregiver to find out the results. Do not assume everything is normal if you have not heard from your caregiver or the medical facility. It is important for you to follow up on all of your test results.  File appropriate papers with authorities. This is important in all assaults, even  if it has occurred in a family or by a friend. SEEK MEDICAL CARE IF:  You have new problems because of your injuries.  You have problems that may be because of the medicine you are taking, such as:  Rash.  Itching.  Swelling.  Trouble breathing.  You develop belly (abdominal) pain, feel sick to your stomach (nausea) or  are vomiting.  You begin to run a temperature.  You need supportive care or referral to a rape crisis center. These are centers with trained personnel who can help you get through this ordeal. SEEK IMMEDIATE MEDICAL CARE IF:  You are afraid of being threatened, beaten, or abused. In [place] call 911.  You receive new injuries related to abuse.  You develop severe pain in any area injured in the assault or have any change in your condition that concerns you.  You faint or lose consciousness.  You develop chest pain or shortness of breath. Document Released: 09/29/2005 Document Revised: 12/22/2011 Document Reviewed: 05/17/2008 [facility] Patient Information 2015 [place], [place]. This information is not intended to replace advice given to you by your health care provider. Make sure you discuss any questions you have with your health care provider.  Back Pain, Adult Low back pain is very common. About 1 in 5 people have back pain.The cause of low back pain is rarely dangerous. The pain often gets better over time.About half of people with a sudden onset of back pain feel better in just 2 weeks. About 8 in 10 people feel better by 6 weeks.  CAUSES Some common causes of back pain include:  Strain of the muscles or ligaments supporting the spine.  Wear and tear (degeneration) of the spinal discs.  Arthritis.  Direct injury to the back. DIAGNOSIS Most of the time, the direct cause of low back pain is not known.However, back pain can be treated effectively even when the exact cause of the pain is unknown.Answering your caregiver's questions about your overall health and symptoms is one of the most accurate ways to make sure the cause of your pain is not dangerous. If your caregiver needs more information, he or she may order lab work or imaging tests (X-rays or MRIs).However, even if imaging tests show changes in your back, this usually does not require surgery. HOME CARE INSTRUCTIONS For  many people, back pain returns.Since low back pain is rarely dangerous, it is often a condition that people can learn to [facility] their own.   Remain active. It is stressful on the back to sit or stand in one place. Do not sit, drive, or stand in one place for more than 30 minutes at a time. Take short walks on level surfaces as soon as pain allows.Try to increase the length of time you walk each day.  Do not stay in bed.Resting more than 1 or 2 days can delay your recovery.  Do not avoid exercise or work.Your body is made to move.It is not dangerous to be active, even though your back may hurt.Your back will likely heal faster if you return to being active before your pain is gone.  Pay attention to your body when you bend and lift. Many people have less discomfortwhen lifting if they bend their knees, keep the load close to their bodies,and avoid twisting. Often, the most comfortable positions are those that put less stress on your recovering back.  Find a comfortable position to sleep. Use a firm mattress and lie on your side with your knees slightly bent.  If you lie on your back, put a pillow under your knees.  Only take over-the-counter or prescription medicines as directed by your caregiver. Over-the-counter medicines to reduce pain and inflammation are often the most helpful.Your caregiver may prescribe muscle relaxant drugs.These medicines help dull your pain so you can more quickly return to your normal activities and healthy exercise.  Put ice on the injured area.  Put ice in a plastic bag.  Place a towel between your skin and the bag.  Leave the ice on for 15-20 minutes, 03-04 times a day for the first 2 to 3 days. After that, ice and heat may be alternated to reduce pain and spasms.  Ask your caregiver about trying back exercises and gentle massage. This may be of some benefit.  Avoid feeling anxious or stressed.Stress increases muscle tension and can worsen back  pain.It is important to recognize when you are anxious or stressed and learn ways to manage it.Exercise is a great option. SEEK MEDICAL CARE IF:  You have pain that is not relieved with rest or medicine.  You have pain that does not improve in 1 week.  You have new symptoms.  You are generally not feeling well. SEEK IMMEDIATE MEDICAL CARE IF:   You have pain that radiates from your back into your legs.  You develop new bowel or bladder control problems.  You have unusual weakness or numbness in your arms or legs.  You develop nausea or vomiting.  You develop abdominal pain.  You feel faint. Document Released: 09/29/2005 Document Revised: 03/30/2012 Document Reviewed: 01/31/2014 [facility] Patient Information 2015 [place], [place]. This information is not intended to replace advice given to you by your health care provider. Make sure you discuss any questions you have with your health care provider.
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
## 2015-01-29 ENCOUNTER — Encounter (HOSPITAL_COMMUNITY): Payer: Self-pay | Admitting: Emergency Medicine

## 2015-01-29 ENCOUNTER — Emergency Department (HOSPITAL_COMMUNITY)
Admission: EM | Admit: 2015-01-29 | Discharge: 2015-01-29 | Disposition: A | Discharge status: Home / Self Care | Payer: Self-pay | Attending: Emergency Medicine | Admitting: Emergency Medicine

## 2015-01-29 ENCOUNTER — Emergency Department (HOSPITAL_COMMUNITY): Payer: Self-pay

## 2015-01-29 DIAGNOSIS — Y998 Other external cause status: Secondary | ICD-10-CM | POA: Insufficient documentation

## 2015-01-29 DIAGNOSIS — M542 Cervicalgia: Secondary | ICD-10-CM

## 2015-01-29 DIAGNOSIS — Z720 Tobacco use: Secondary | ICD-10-CM | POA: Insufficient documentation

## 2015-01-29 DIAGNOSIS — Z87448 Personal history of other diseases of urinary system: Secondary | ICD-10-CM | POA: Insufficient documentation

## 2015-01-29 DIAGNOSIS — S79911A Unspecified injury of right hip, initial encounter: Secondary | ICD-10-CM | POA: Insufficient documentation

## 2015-01-29 DIAGNOSIS — Z792 Long term (current) use of antibiotics: Secondary | ICD-10-CM | POA: Insufficient documentation

## 2015-01-29 DIAGNOSIS — Z79899 Other long term (current) drug therapy: Secondary | ICD-10-CM | POA: Insufficient documentation

## 2015-01-29 DIAGNOSIS — Y9389 Activity, other specified: Secondary | ICD-10-CM | POA: Insufficient documentation

## 2015-01-29 DIAGNOSIS — M546 Pain in thoracic spine: Secondary | ICD-10-CM

## 2015-01-29 DIAGNOSIS — S3992XA Unspecified injury of lower back, initial encounter: Secondary | ICD-10-CM | POA: Insufficient documentation

## 2015-01-29 DIAGNOSIS — S199XXA Unspecified injury of neck, initial encounter: Secondary | ICD-10-CM | POA: Insufficient documentation

## 2015-01-29 DIAGNOSIS — M7918 Myalgia, other site: Secondary | ICD-10-CM

## 2015-01-29 DIAGNOSIS — M25551 Pain in right hip: Secondary | ICD-10-CM

## 2015-01-29 DIAGNOSIS — W010XXA Fall on same level from slipping, tripping and stumbling without subsequent striking against object, initial encounter: Secondary | ICD-10-CM | POA: Insufficient documentation

## 2015-01-29 DIAGNOSIS — Y9289 Other specified places as the place of occurrence of the external cause: Secondary | ICD-10-CM | POA: Insufficient documentation

## 2015-01-29 DIAGNOSIS — S29092A Other injury of muscle and tendon of back wall of thorax, initial encounter: Secondary | ICD-10-CM | POA: Insufficient documentation

## 2015-01-29 DIAGNOSIS — M545 Low back pain, unspecified: Secondary | ICD-10-CM

## 2015-01-29 MED ORDER — MORPHINE SULFATE 4 MG/ML IJ SOLN
4.0000 mg | Freq: Once | INTRAMUSCULAR | Status: AC
  Administered 2015-01-29: 4 mg via INTRAMUSCULAR
  Filled 2015-01-29: qty 1

## 2015-01-29 MED ORDER — IBUPROFEN 600 MG PO TABS
600.0000 mg | ORAL_TABLET | Freq: Four times a day (QID) | ORAL | Status: DC | PRN
Start: 2015-01-29 — End: 2016-03-02

## 2015-01-29 MED ORDER — OXYCODONE-ACETAMINOPHEN 5-325 MG PO TABS
1.0000 | ORAL_TABLET | Freq: Once | ORAL | Status: AC
  Administered 2015-01-29: 1 via ORAL
  Filled 2015-01-29: qty 1

## 2015-01-29 MED ORDER — METHOCARBAMOL 500 MG PO TABS
500.0000 mg | ORAL_TABLET | Freq: Two times a day (BID) | ORAL | Status: DC
Start: 2015-01-29 — End: 2016-03-02

## 2015-01-29 MED ORDER — TRAMADOL HCL 50 MG PO TABS
50.0000 mg | ORAL_TABLET | Freq: Four times a day (QID) | ORAL | Status: DC | PRN
Start: 2015-01-29 — End: 2016-03-02

## 2015-01-29 MED ORDER — METHOCARBAMOL 500 MG PO TABS
500.0000 mg | ORAL_TABLET | Freq: Once | ORAL | Status: AC
  Administered 2015-01-29: 500 mg via ORAL
  Filled 2015-01-29: qty 1

## 2015-01-29 NOTE — ED Provider Notes (Signed)
CSN: [id number]     Arrival date & time 01/29/15  1826 History   This chart is scribed for non-physician practitioner, Junius Finner, PA-C, working with Arby Barrette, MD by Abel Presto, ED Scribe.  This patient was seen in room TR09C/TR09C and the patient's care was started 7:13 PM.     Chief Complaint  Patient presents with  . Fall     Patient is a 29 y.o. female presenting with fall. The history is provided by the patient. No language interpreter was used.  Fall Associated symptoms include headaches. Pertinent negatives include no chest pain and no abdominal pain.   HPI Comments: Karen Warren is a 29 y.o. female who presents to the Emergency Department complaining of fall this morning around 11 AM. Pt was mopping her floor and "playing around." Pt's legs came up from under her and she fell on her back onto hardwood floor and notes "seeing black" but did not pass out. She states when she got up she took a nap on the couch. Pt reports associated worsening headache, lower back pain, right sided hip and intermittent right arm pain with right hand numbness after she woke up from her nap. Pt took Motrin w/o relief. Pt denies abdominal pain and chest pain. Pt has no PCP. Denies change in vision, nausea or vomiting. No hx of back surgeries. Pt is not on blood thinners.  No loss of bowel or bladder.   Past Medical History  Diagnosis Date  . Renal disorder     kidney infection   Past Surgical History  Procedure Laterality Date  . Cholecystectomy    . Cesarean section    . Tubal ligation     Family History  Problem Relation Age of Onset  . Hypertension Mother   . Hypertension Maternal Grandmother   . Diabetes Maternal Grandmother    History  Substance Use Topics  . Smoking status: Current Every Day Smoker -- 0.30 packs/day    Types: Cigarettes  . Smokeless tobacco: Never Used  . Alcohol Use: No   OB History    No data available     Review of Systems  Cardiovascular:  Negative for chest pain.  Gastrointestinal: Negative for abdominal pain.  Musculoskeletal: Positive for myalgias, back pain, arthralgias and neck pain.  Neurological: Positive for weakness, numbness and headaches.  All other systems reviewed and are negative.     Allergies  Review of patient's allergies indicates no known allergies.  Home Medications   Prior to Admission medications   Medication Sig Start Date End Date Taking? Authorizing Provider  acetaminophen (TYLENOL) 500 MG tablet Take 1,000 mg by mouth every 6 (six) hours as needed for mild pain.    Historical Provider, MD  ciprofloxacin (CIPRO) 500 MG tablet Take 1 tablet (500 mg total) by mouth 2 (two) times daily. 04/25/14   Richarda Overlie, MD  diazepam (VALIUM) 5 MG tablet Take 1 tablet (5 mg total) by mouth every 8 (eight) hours as needed for muscle spasms. 10/15/14   Trixie Dredge, PA-C  HYDROcodone-acetaminophen (NORCO/VICODIN) 5-325 MG per tablet Take 1-2 tablets by mouth every 4 (four) hours as needed for moderate pain or severe pain. 10/15/14   Trixie Dredge, PA-C  ibuprofen (ADVIL,MOTRIN) 600 MG tablet Take 1 tablet (600 mg total) by mouth every 6 (six) hours as needed. 06/12/14   Renne Crigler, PA-C  ibuprofen (ADVIL,MOTRIN) 600 MG tablet Take 1 tablet (600 mg total) by mouth every 6 (six) hours as needed. 01/29/15  Junius FinnerErin O'Malley, PA-C  methocarbamol (ROBAXIN) 500 MG tablet Take 1 tablet (500 mg total) by mouth 2 (two) times daily. 01/29/15   Junius FinnerErin O'Malley, PA-C  oxycodone (OXY-IR) 5 MG capsule Take 1 capsule (5 mg total) by mouth every 6 (six) hours as needed. 04/25/14   Richarda OverlieNayana Abrol, MD  prochlorperazine (COMPAZINE) 25 MG suppository Place 1 suppository (25 mg total) rectally every 12 (twelve) hours as needed for nausea or vomiting. 04/25/14   Richarda OverlieNayana Abrol, MD  traMADol (ULTRAM) 50 MG tablet Take 1 tablet (50 mg total) by mouth every 6 (six) hours as needed. 06/12/14   Renne CriglerJoshua Geiple, PA-C  traMADol (ULTRAM) 50 MG tablet Take 1 tablet (50  mg total) by mouth every 6 (six) hours as needed. 01/29/15   Junius FinnerErin O'Malley, PA-C   BP 114/73 mmHg  Pulse 74  Temp(Src) 97.8 F (36.6 C) (Oral)  Resp 16  SpO2 100% Physical Exam  Constitutional: She is oriented to person, place, and time. She appears well-developed and well-nourished.  Pt laying on exam bed appears uncomfortable  HENT:  Head: Normocephalic and atraumatic.  Eyes: EOM are normal. Pupils are equal, round, and reactive to light.  Neck: Normal range of motion. Neck supple.  tenderness to left and right cervical muscles without focal tenderness; full ROM of neck; increase pain with neck rotation  Cardiovascular: Normal rate, regular rhythm, normal heart sounds and intact distal pulses.   No murmur heard. Pulmonary/Chest: Effort normal and breath sounds normal. No respiratory distress. She has no wheezes. She has no rales. She exhibits no tenderness.  Abdominal: Soft. There is no tenderness.  Musculoskeletal: Normal range of motion.  Diffuse tenderness to cervical, thoracic, lumbar and sacral spine and muscles; no focal tenderness Tenderness to right hip; full ROM of upper and lower extremities with 5/5 strength; no tenderness to arms or legs antalgic gait; Pt slow to move from lying to sitting to standing position due to pain.   Neurological: She is alert and oriented to person, place, and time.  Sensation intact in upper and lower extremities bialterally.  Skin: Skin is warm and dry.  Skin intact; no ecchymossis  Psychiatric: She has a normal mood and affect. Her behavior is normal.  Nursing note and vitals reviewed.   ED Course  Procedures (including critical care time) DIAGNOSTIC STUDIES: Oxygen Saturation is 99% on room air, normal by my interpretation.    COORDINATION OF CARE: 7:17 PM Discussed treatment plan with patient at beside, the patient agrees with the plan and has no further questions at this time.   8:44 PM pt arrived back from imaging, tearful due to  having to be moved a lot for imaging. Will give additional pain medication.   Labs Review Labs Reviewed - No data to display  Imaging Review Dg Cervical Spine Complete  01/29/2015   CLINICAL DATA:  Fall  EXAM: CERVICAL SPINE  4+ VIEWS  COMPARISON:  None.  FINDINGS: Cervical lordosis is reversed. No vertebral compression deformity. Disc height is maintained. Prevertebral soft tissues are unremarkable. Foramina patent. Odontoid is intact.  IMPRESSION: No acute bony pathology.   Electronically Signed   By: Jolaine ClickArthur  Hoss M.D.   On: 01/29/2015 21:08   Dg Thoracic Spine 2 View  01/29/2015   CLINICAL DATA:  Fall today.  Back pain  EXAM: THORACIC SPINE - 2 VIEW  COMPARISON:  None.  FINDINGS: There is no evidence of thoracic spine fracture. Alignment is normal. No other significant bone abnormalities are identified.  IMPRESSION: Negative.  Electronically Signed   By: Marlan Palau M.D.   On: 01/29/2015 21:07   Dg Lumbar Spine Complete  01/29/2015   CLINICAL DATA:  Fall today.  Back pain  EXAM: LUMBAR SPINE - COMPLETE 4+ VIEW  COMPARISON:  None.  FINDINGS: There is no evidence of lumbar spine fracture. Alignment is normal. Intervertebral disc spaces are maintained.  IMPRESSION: Negative.   Electronically Signed   By: Marlan Palau M.D.   On: 01/29/2015 21:08   Dg Sacrum/coccyx  01/29/2015   CLINICAL DATA:  Larey Seat while mi being the floor.  Sacrococcygeal pain.  EXAM: SACRUM AND COCCYX - 2+ VIEW  COMPARISON:  CT 04/21/2014  FINDINGS: There is no evidence of fracture or other focal bone lesions. Benign thickening of the dorsal cortex of the sacrum is unchanged and not significant.  IMPRESSION: Negative.   Electronically Signed   By: Paulina Fusi M.D.   On: 01/29/2015 21:10   Dg Hip Unilat With Pelvis 2-3 Views Right  01/29/2015   CLINICAL DATA:  Fall while mopping the floor today. Right hip pain. Initial encounter.  EXAM: RIGHT HIP (WITH PELVIS) 2-3 VIEWS  COMPARISON:  CT abdomen and pelvis 04/21/2014.  Abdominal radiographs 01/12/2011.  FINDINGS: There is no evidence of hip fracture or dislocation. There is no evidence of arthropathy or other focal bone abnormality.  IMPRESSION: Negative.   Electronically Signed   By: Sebastian Ache   On: 01/29/2015 21:09     EKG Interpretation None      MDM   Final diagnoses:  Right hip pain  Neck pain  Bilateral thoracic back pain  Bilateral low back pain without sciatica  Buttock pain  Fall from slip, trip, or stumble, initial encounter   Pt presenting to ED with c/o diffuse pain after slip and fall earlier this morning.  Reports "seeing black" but denies LOC. Reports increased pain in back after napping.  Pt has diffuse tenderness. FROM all extremities w/o focal neuro deficit.  Pain improved in ED with percocet, robaxin and IM morphine.  Pt able to ambulate w/o difficulty. Home care instructions provided. Advised to f/u with PCP by the end of the week Return precautions provided. Pt verbalized understanding and agreement with tx plan.   I personally performed the services described in this documentation, which was scribed in my presence. The recorded information has been reviewed and is accurate.    Junius Finner, PA-C 01/30/15 0205  Arby Barrette, MD 02/07/15 [phone]

## 2015-01-29 NOTE — ED Notes (Signed)
Pt ambulating in hall without any problems.

## 2015-01-29 NOTE — ED Notes (Signed)
Pt to xray at this time.

## 2015-01-29 NOTE — ED Notes (Signed)
Pt sts fall today with right side pain; pt sts pain into back and neck into head

## 2015-09-04 ENCOUNTER — Encounter (HOSPITAL_COMMUNITY): Payer: Self-pay | Admitting: Emergency Medicine

## 2015-09-04 ENCOUNTER — Emergency Department (HOSPITAL_COMMUNITY)
Admission: EM | Admit: 2015-09-04 | Discharge: 2015-09-04 | Disposition: A | Discharge status: Home / Self Care | Payer: Self-pay | Attending: Emergency Medicine | Admitting: Emergency Medicine

## 2015-09-04 DIAGNOSIS — R059 Cough, unspecified: Secondary | ICD-10-CM

## 2015-09-04 DIAGNOSIS — R110 Nausea: Secondary | ICD-10-CM | POA: Insufficient documentation

## 2015-09-04 DIAGNOSIS — R109 Unspecified abdominal pain: Secondary | ICD-10-CM | POA: Insufficient documentation

## 2015-09-04 DIAGNOSIS — F17210 Nicotine dependence, cigarettes, uncomplicated: Secondary | ICD-10-CM | POA: Insufficient documentation

## 2015-09-04 DIAGNOSIS — R05 Cough: Secondary | ICD-10-CM

## 2015-09-04 DIAGNOSIS — Z87448 Personal history of other diseases of urinary system: Secondary | ICD-10-CM | POA: Insufficient documentation

## 2015-09-04 DIAGNOSIS — B349 Viral infection, unspecified: Secondary | ICD-10-CM | POA: Insufficient documentation

## 2015-09-04 DIAGNOSIS — R197 Diarrhea, unspecified: Secondary | ICD-10-CM | POA: Insufficient documentation

## 2015-09-04 MED ORDER — SODIUM CHLORIDE 0.9 % IV BOLUS (SEPSIS)
500.0000 mL | Freq: Once | INTRAVENOUS | Status: DC
Start: 2015-09-04 — End: 2015-09-04

## 2015-09-04 MED ORDER — KETOROLAC TROMETHAMINE 30 MG/ML IJ SOLN
30.0000 mg | Freq: Once | INTRAMUSCULAR | Status: AC
  Administered 2015-09-04: 30 mg via INTRAMUSCULAR
  Filled 2015-09-04: qty 1

## 2015-09-04 MED ORDER — ONDANSETRON HCL 4 MG PO TABS
4.0000 mg | ORAL_TABLET | Freq: Once | ORAL | Status: AC
  Administered 2015-09-04: 4 mg via ORAL
  Filled 2015-09-04: qty 1

## 2015-09-04 MED ORDER — SODIUM CHLORIDE 0.9 % IV BOLUS (SEPSIS): 1000.0000 mL | Freq: Once | INTRAVENOUS | Status: DC

## 2015-09-04 MED ORDER — HYDROCODONE-HOMATROPINE 5-1.5 MG/5ML PO SYRP
5.0000 mL | ORAL_SOLUTION | Freq: Once | ORAL | Status: AC
  Administered 2015-09-04: 5 mL via ORAL
  Filled 2015-09-04: qty 5

## 2015-09-04 MED ORDER — ALBUTEROL SULFATE HFA 108 (90 BASE) MCG/ACT IN AERS
2.0000 | INHALATION_SPRAY | Freq: Once | RESPIRATORY_TRACT | Status: AC
  Administered 2015-09-04: 2 via RESPIRATORY_TRACT
  Filled 2015-09-04: qty 6.7

## 2015-09-04 NOTE — ED Provider Notes (Signed)
CSN: [id number]     Arrival date & time 09/04/15  [id number] History   First MD Initiated Contact with Patient 09/04/15 0911     Chief Complaint  Patient presents with  . URI   (Consider location/radiation/quality/duration/timing/severity/associated sxs/prior Treatment) HPI  Patient is a 29 year old female with no significant past medical history who presents with 1 week of cough, congestion, muscle aches, nausea, and diarrhea. Patient reports that she has tried several medications including nyquil without significant relief of symptoms. Several family members have also been sick with the same illness. Has not noticed anything that makes the symptoms better or worse. Additionally reports diffuse muscle aches. Also reports diarrhea for the past week with mild diffuse abdominal pain. Decreased appetite over the past week due to nausea. Was only able to eat a small piece of ham yesterday. No dysuria.    Past Medical History  Diagnosis Date  . Renal disorder     kidney infection   Past Surgical History  Procedure Laterality Date  . Cholecystectomy    . Cesarean section    . Tubal ligation     Family History  Problem Relation Age of Onset  . Hypertension Mother   . Hypertension Maternal Grandmother   . Diabetes Maternal Grandmother    Social History  Substance Use Topics  . Smoking status: Current Every Day Smoker -- 0.30 packs/day    Types: Cigarettes  . Smokeless tobacco: Never Used  . Alcohol Use: No   OB History    No data available     Review of Systems  Constitutional: Positive for chills. Negative for fever.  HENT: Positive for congestion, rhinorrhea, sinus pressure, sneezing and sore throat.   Eyes: Positive for discharge.  Respiratory: Positive for cough. Negative for shortness of breath.   Cardiovascular: Negative for chest pain.  Gastrointestinal: Positive for nausea, abdominal pain and diarrhea.  Endocrine: Negative.   Genitourinary: Negative for dysuria.   Musculoskeletal: Positive for myalgias.  Skin: Negative.   Allergic/Immunologic: Negative.   Neurological: Negative.   Hematological: Negative.   Psychiatric/Behavioral: Negative.       Allergies  Review of patient's allergies indicates no known allergies.  Home Medications   Prior to Admission medications   Medication Sig Start Date End Date Taking? Authorizing Provider  acetaminophen (TYLENOL) 500 MG tablet Take 1,000 mg by mouth every 6 (six) hours as needed for mild pain.   Yes Historical Provider, MD  brompheniramine-pseudoephedrine (DIMETAPP) 1-15 MG/5ML ELIX Take 15-30 mLs by mouth 2 (two) times daily as needed for congestion.   Yes Historical Provider, MD  Pseudoeph-Doxylamine-DM-APAP (NYQUIL PO) Take 30 mLs by mouth at bedtime as needed (cold).   Yes Historical Provider, MD  diazepam (VALIUM) 5 MG tablet Take 1 tablet (5 mg total) by mouth every 8 (eight) hours as needed for muscle spasms. Patient not taking: Reported on 09/04/2015 10/15/14   Trixie DredgeEmily West, PA-C  ibuprofen (ADVIL,MOTRIN) 600 MG tablet Take 1 tablet (600 mg total) by mouth every 6 (six) hours as needed. Patient not taking: Reported on 09/04/2015 01/29/15   Junius FinnerErin O'Malley, PA-C  methocarbamol (ROBAXIN) 500 MG tablet Take 1 tablet (500 mg total) by mouth 2 (two) times daily. Patient not taking: Reported on 09/04/2015 01/29/15   Junius FinnerErin O'Malley, PA-C  traMADol (ULTRAM) 50 MG tablet Take 1 tablet (50 mg total) by mouth every 6 (six) hours as needed. Patient not taking: Reported on 09/04/2015 01/29/15   Junius FinnerErin O'Malley, PA-C   BP 120/79 mmHg  Pulse  94  Temp(Src) 98.4 F (36.9 C) (Oral)  Resp 18  SpO2 100%  LMP 09/04/2015 Physical Exam  Constitutional: She is oriented to person, place, and time. She appears well-developed and well-nourished. No distress.  HENT:  Head: Normocephalic and atraumatic.  Mouth/Throat: Uvula is midline and mucous membranes are normal. Posterior oropharyngeal erythema present. No oropharyngeal  exudate.  Eyes: EOM are normal. Pupils are equal, round, and reactive to light.  Neck: Normal range of motion. Neck supple.  Cardiovascular: Normal rate, regular rhythm and normal heart sounds.   Pulmonary/Chest: Effort normal and breath sounds normal. No respiratory distress. She has no wheezes.  Abdominal: Soft. Bowel sounds are normal. She exhibits no distension. There is no tenderness.  Musculoskeletal: Normal range of motion. She exhibits no edema.  Neurological: She is alert and oriented to person, place, and time. No cranial nerve deficit.  Skin: Skin is warm and dry. No rash noted.  Psychiatric: She has a normal mood and affect. Her behavior is normal.  Nursing note and vitals reviewed.   ED Course  Procedures (including critical care time) Labs Review Labs Reviewed - No data to display  Imaging Review No results found. I have personally reviewed and evaluated these images and lab results as part of my medical decision-making.   EKG Interpretation None      MDM   Final diagnoses:  Cough  Viral syndrome   Patient is a 29 year old female with no significant past medical history presenting with a constellation of symptoms consistent with URI including cough, congestion, and sneezing for 1 week with associated nausea and diarrhea. Presentation most consistent with viral etiology. No signs of bacterial infection. Typical course of illness discussed with patient. Will not check influenza swab as patient has had illness for 1 week and all other family members have been sick for the past several weeks. Will discharge with albuterol inhaler. Return precautions reviewed.    Ardith Dark, MD 09/04/15 1115  Azalia Bilis, MD 09/04/15 [phone]

## 2015-09-04 NOTE — ED Notes (Signed)
Per patient, states cold symptoms for about a week-congestion, cough

## 2015-09-04 NOTE — Discharge Instructions (Signed)

## 2016-03-01 ENCOUNTER — Encounter (HOSPITAL_COMMUNITY): Payer: Self-pay

## 2016-03-01 DIAGNOSIS — M542 Cervicalgia: Secondary | ICD-10-CM | POA: Insufficient documentation

## 2016-03-01 DIAGNOSIS — S069X1A Unspecified intracranial injury with loss of consciousness of 30 minutes or less, initial encounter: Secondary | ICD-10-CM | POA: Insufficient documentation

## 2016-03-01 DIAGNOSIS — F17210 Nicotine dependence, cigarettes, uncomplicated: Secondary | ICD-10-CM | POA: Insufficient documentation

## 2016-03-01 DIAGNOSIS — M549 Dorsalgia, unspecified: Secondary | ICD-10-CM | POA: Insufficient documentation

## 2016-03-01 DIAGNOSIS — Y939 Activity, unspecified: Secondary | ICD-10-CM | POA: Insufficient documentation

## 2016-03-01 DIAGNOSIS — Y999 Unspecified external cause status: Secondary | ICD-10-CM | POA: Insufficient documentation

## 2016-03-01 DIAGNOSIS — Z79899 Other long term (current) drug therapy: Secondary | ICD-10-CM | POA: Insufficient documentation

## 2016-03-01 DIAGNOSIS — Z791 Long term (current) use of non-steroidal anti-inflammatories (NSAID): Secondary | ICD-10-CM | POA: Insufficient documentation

## 2016-03-01 DIAGNOSIS — Y929 Unspecified place or not applicable: Secondary | ICD-10-CM | POA: Insufficient documentation
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
## 2016-03-02 ENCOUNTER — Emergency Department (HOSPITAL_COMMUNITY)
Admission: EM | Admit: 2016-03-02 | Discharge: 2016-03-02 | Disposition: A | Discharge status: Home / Self Care | Payer: Self-pay | Attending: Emergency Medicine | Admitting: Emergency Medicine

## 2016-03-02 ENCOUNTER — Emergency Department (HOSPITAL_COMMUNITY): Payer: Self-pay

## 2016-03-02 DIAGNOSIS — T148XXA Other injury of unspecified body region, initial encounter: Secondary | ICD-10-CM

## 2016-03-02 DIAGNOSIS — S069X1A Unspecified intracranial injury with loss of consciousness of 30 minutes or less, initial encounter: Secondary | ICD-10-CM

## 2016-03-02 LAB — POC URINE PREG, ED: PREG TEST UR: NEGATIVE

## 2016-03-02 MED ORDER — NAPROXEN 250 MG PO TABS
500.0000 mg | ORAL_TABLET | Freq: Once | ORAL | Status: AC
  Administered 2016-03-02: 500 mg via ORAL
  Filled 2016-03-02: qty 2

## 2016-03-02 MED ORDER — LORAZEPAM 2 MG/ML IJ SOLN
1.0000 mg | Freq: Once | INTRAMUSCULAR | Status: AC
  Administered 2016-03-02: 1 mg via INTRAVENOUS
  Filled 2016-03-02: qty 1

## 2016-03-02 MED ORDER — FENTANYL CITRATE (PF) 100 MCG/2ML IJ SOLN
100.0000 ug | Freq: Once | INTRAMUSCULAR | Status: AC
  Administered 2016-03-02: 100 ug via INTRAVENOUS
  Filled 2016-03-02: qty 2

## 2016-03-02 MED ORDER — TRAMADOL HCL 50 MG PO TABS: 50.0000 mg | ORAL_TABLET | Freq: Four times a day (QID) | ORAL | Status: DC | PRN

## 2016-03-02 MED ORDER — NAPROXEN 500 MG PO TABS: 500.0000 mg | ORAL_TABLET | Freq: Two times a day (BID) | ORAL | Status: DC

## 2016-03-02 MED ORDER — METHOCARBAMOL 500 MG PO TABS: 1000.0000 mg | ORAL_TABLET | Freq: Four times a day (QID) | ORAL | Status: DC

## 2016-03-02 NOTE — ED Notes (Signed)
Pt to xray with this RN.

## 2016-03-02 NOTE — ED Notes (Signed)
Pt back from CT

## 2016-03-02 NOTE — ED Notes (Signed)
Called twice with no answer.

## 2016-03-02 NOTE — ED Notes (Signed)
Pt ambulated. Reports slight dizziness, and shuffles feet but able to walk. Pt reports wanting to go home. PA Tatyana made aware. Pt to be discharged.

## 2016-03-02 NOTE — ED Notes (Signed)
Pt had to be brought back from xray. States that xray technicians were laughing at pt. Pt hysterically crying on arrival to the unit. Calmed pt down. Medicine given.
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
## 2016-03-02 NOTE — ED Notes (Signed)
Called xray, informed preg test neg, pt ready to go

## 2016-03-02 NOTE — ED Notes (Signed)
Pt to CT
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
## 2016-11-07 ENCOUNTER — Emergency Department (HOSPITAL_COMMUNITY): Admission: EM | Admit: 2016-11-07 | Discharge: 2016-11-07 | Discharge status: Against Medical Advice | Payer: Self-pay
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
## 2016-11-09 ENCOUNTER — Emergency Department (HOSPITAL_COMMUNITY)
Admission: EM | Admit: 2016-11-09 | Discharge: 2016-11-09 | Disposition: A | Discharge status: Home / Self Care | Payer: Self-pay | Attending: Emergency Medicine | Admitting: Emergency Medicine

## 2016-11-09 ENCOUNTER — Encounter (HOSPITAL_COMMUNITY): Payer: Self-pay | Admitting: Emergency Medicine

## 2016-11-09 DIAGNOSIS — H9201 Otalgia, right ear: Secondary | ICD-10-CM | POA: Insufficient documentation

## 2016-11-09 DIAGNOSIS — F17210 Nicotine dependence, cigarettes, uncomplicated: Secondary | ICD-10-CM | POA: Insufficient documentation

## 2016-11-09 DIAGNOSIS — J029 Acute pharyngitis, unspecified: Secondary | ICD-10-CM | POA: Insufficient documentation

## 2016-11-09 LAB — RAPID STREP SCREEN (MED CTR MEBANE ONLY): STREPTOCOCCUS, GROUP A SCREEN (DIRECT): NEGATIVE

## 2016-11-09 MED ORDER — PSEUDOEPHEDRINE HCL ER 120 MG PO TB12: 120.0000 mg | ORAL_TABLET | Freq: Two times a day (BID) | ORAL | 0 refills | Status: DC

## 2016-11-09 MED ORDER — HYDROCODONE-ACETAMINOPHEN 5-325 MG PO TABS: 1.0000 | ORAL_TABLET | ORAL | 0 refills | Status: DC | PRN

## 2016-11-09 MED ORDER — ONDANSETRON 4 MG PO TBDP
4.0000 mg | ORAL_TABLET | Freq: Once | ORAL | Status: AC
  Administered 2016-11-09: 4 mg via ORAL
  Filled 2016-11-09: qty 1

## 2016-11-09 MED ORDER — AMOXICILLIN-POT CLAVULANATE 875-125 MG PO TABS: 1.0000 | ORAL_TABLET | Freq: Two times a day (BID) | ORAL | 0 refills | Status: DC

## 2016-11-09 MED ORDER — HYDROCODONE-ACETAMINOPHEN 5-325 MG PO TABS
1.0000 | ORAL_TABLET | Freq: Once | ORAL | Status: AC
  Administered 2016-11-09: 1 via ORAL
  Filled 2016-11-09: qty 1

## 2016-11-09 MED ORDER — PREDNISONE 20 MG PO TABS: 40.0000 mg | ORAL_TABLET | Freq: Every day | ORAL | 0 refills | Status: DC

## 2016-11-09 NOTE — Discharge Instructions (Signed)
Contact a health care provider if: Your pain does not improve within 2 days. Your earache gets worse. You have new symptoms. You have a fever.

## 2016-11-09 NOTE — ED Notes (Signed)
Declined W/C at D/C and was escorted to lobby by RN. 
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
## 2016-11-09 NOTE — ED Triage Notes (Signed)
PT reports she did not take the Flu shot.

## 2016-11-09 NOTE — ED Triage Notes (Signed)
Pt. Stated, I've had a sore throat with ear pain for almost 3 days.

## 2016-11-11 LAB — CULTURE, GROUP A STREP (THRC)

## 2016-11-29 ENCOUNTER — Emergency Department (HOSPITAL_COMMUNITY)
Admission: EM | Admit: 2016-11-29 | Discharge: 2016-11-29 | Disposition: A | Discharge status: Home / Self Care | Payer: Self-pay | Attending: Emergency Medicine | Admitting: Emergency Medicine

## 2016-11-29 DIAGNOSIS — R52 Pain, unspecified: Secondary | ICD-10-CM | POA: Insufficient documentation

## 2016-11-29 DIAGNOSIS — R05 Cough: Secondary | ICD-10-CM | POA: Insufficient documentation

## 2016-11-29 DIAGNOSIS — R509 Fever, unspecified: Secondary | ICD-10-CM | POA: Insufficient documentation

## 2016-11-29 DIAGNOSIS — F17210 Nicotine dependence, cigarettes, uncomplicated: Secondary | ICD-10-CM | POA: Insufficient documentation

## 2016-11-29 DIAGNOSIS — R6889 Other general symptoms and signs: Secondary | ICD-10-CM

## 2016-11-29 DIAGNOSIS — Z79899 Other long term (current) drug therapy: Secondary | ICD-10-CM | POA: Insufficient documentation

## 2016-11-29 MED ORDER — BENZONATATE 100 MG PO CAPS: 100.0000 mg | ORAL_CAPSULE | Freq: Three times a day (TID) | ORAL | 0 refills | Status: DC

## 2016-11-29 MED ORDER — NAPROXEN 500 MG PO TABS: 500.0000 mg | ORAL_TABLET | Freq: Two times a day (BID) | ORAL | 0 refills | Status: DC

## 2016-11-29 MED ORDER — BENZONATATE 100 MG PO CAPS
100.0000 mg | ORAL_CAPSULE | Freq: Once | ORAL | Status: AC
  Administered 2016-11-29: 100 mg via ORAL
  Filled 2016-11-29: qty 1

## 2016-11-29 MED ORDER — FLUTICASONE PROPIONATE 50 MCG/ACT NA SUSP: 1.0000 | Freq: Every day | NASAL | 0 refills | Status: AC

## 2016-11-29 MED ORDER — OSELTAMIVIR PHOSPHATE 75 MG PO CAPS
75.0000 mg | ORAL_CAPSULE | Freq: Once | ORAL | Status: AC
  Administered 2016-11-29: 75 mg via ORAL
  Filled 2016-11-29: qty 1

## 2016-11-29 MED ORDER — OSELTAMIVIR PHOSPHATE 75 MG PO CAPS: 75.0000 mg | ORAL_CAPSULE | Freq: Two times a day (BID) | ORAL | 0 refills | Status: DC

## 2016-11-29 MED ORDER — ACETAMINOPHEN 325 MG PO TABS
650.0000 mg | ORAL_TABLET | Freq: Once | ORAL | Status: AC
  Administered 2016-11-29: 650 mg via ORAL
  Filled 2016-11-29: qty 2
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
## 2016-11-29 NOTE — ED Triage Notes (Signed)
Pt began having body aches and cough today.  She took 800mg  Ibuprofen at 1600 today and some Thera Flu

## 2016-11-29 NOTE — ED Provider Notes (Signed)
[facility]-EMERGENCY DEPT Provider Note   CSN: [id number] Arrival date & time: 11/29/16  2016  By signing my name below, I, Linna Darner, attest that this documentation has been prepared under the direction and in the presence of Audry Pili, PA-C. Electronically Signed: Linna Darner, Scribe. 11/29/2016. 9:11 PM.  History   Chief Complaint Chief Complaint  Patient presents with  . Cough  . Generalized Body Aches  . Nausea    The history is provided by the patient. No language interpreter was used.     HPI Comments: Karen Warren is a 31 y.o. female who presents to the Emergency Department complaining of a persistent non-productive cough beginning earlier today. She reports associated subjective fever, nasal congestion, rhinorrhea, body aches, headache, and intermittent nausea. Pt notes some chest pain secondary to her cough as well. She tried ibuprofen and TheraFlu PTA with minimal improvement of her symptoms. She states she has had 1-2 bottles of water today. No known sick contacts. No h/o DM. Pt denies vomiting, diarrhea, or any other associated symptoms.  Past Medical History:  Diagnosis Date  . Renal disorder    kidney infection    Patient Active Problem List   Diagnosis Date Noted  . Acute pyelonephritis 04/21/2014  . Sepsis (HCC) 04/21/2014  . Diarrhea 06/09/2012  . Leukocytosis 06/07/2012  . Anemia 06/07/2012  . Pyelonephritis 06/06/2012  . Abdominal pain 06/06/2012  . Pain, dental 06/06/2012  . Fever 06/06/2012  . Hypokalemia 06/06/2012    Past Surgical History:  Procedure Laterality Date  . CESAREAN SECTION    . CHOLECYSTECTOMY    . TUBAL LIGATION      OB History    No data available       Home Medications    Prior to Admission medications   Medication Sig Start Date End Date Taking? Authorizing Provider  amoxicillin-clavulanate (AUGMENTIN) 875-125 MG tablet Take 1 tablet by mouth 2 (two) times daily. One po bid x 7 days 11/09/16   Arthor Captain,  PA-C  HYDROcodone-acetaminophen (NORCO) 5-325 MG tablet Take 1 tablet by mouth every 4 (four) hours as needed for moderate pain. 11/09/16   Arthor Captain, PA-C  methocarbamol (ROBAXIN) 500 MG tablet Take 2 tablets (1,000 mg total) by mouth 4 (four) times daily. 03/02/16   Renne Crigler, PA-C  naproxen (NAPROSYN) 500 MG tablet Take 1 tablet (500 mg total) by mouth 2 (two) times daily. 03/02/16   Renne Crigler, PA-C  predniSONE (DELTASONE) 20 MG tablet Take 2 tablets (40 mg total) by mouth daily. 11/09/16   Arthor Captain, PA-C  pseudoephedrine (SUDAFED 12 HOUR) 120 MG 12 hr tablet Take 1 tablet (120 mg total) by mouth 2 (two) times daily. 11/09/16   Arthor Captain, PA-C    Family History Family History  Problem Relation Age of Onset  . Hypertension Mother   . Hypertension Maternal Grandmother   . Diabetes Maternal Grandmother     Social History Social History  Substance Use Topics  . Smoking status: Current Every Day Smoker    Packs/day: 0.30    Types: Cigarettes  . Smokeless tobacco: Never Used  . Alcohol use No     Allergies   Patient has no known allergies.   Review of Systems Review of Systems  Constitutional: Positive for fever.  HENT: Positive for congestion and rhinorrhea.   Respiratory: Positive for cough.   Cardiovascular: Positive for chest pain (secondary to cough).  Gastrointestinal: Positive for nausea. Negative for diarrhea and vomiting.  Musculoskeletal: Positive for myalgias.  Neurological: Positive for headaches.     Physical Exam Updated Vital Signs BP 125/72 (BP Location: Left Arm)   Pulse 105   Temp 97.6 F (36.4 C) (Oral)   Resp 18   Ht 5\' 2"  (1.575 m)   Wt 172 lb (78 kg)   LMP 10/31/2016   SpO2 97%   BMI 31.46 kg/m   Physical Exam  Constitutional: She is oriented to person, place, and time. She appears well-developed and well-nourished. No distress.  HENT:  Head: Normocephalic and atraumatic.  Right Ear: Tympanic membrane, external ear and  ear canal normal.  Left Ear: Tympanic membrane, external ear and ear canal normal.  Nose: Nose normal.  Mouth/Throat: Uvula is midline, oropharynx is clear and moist and mucous membranes are normal. No trismus in the jaw. No oropharyngeal exudate, posterior oropharyngeal erythema or tonsillar abscesses.  Eyes: Conjunctivae and EOM are normal. Pupils are equal, round, and reactive to light.  Neck: Normal range of motion. Neck supple. No tracheal deviation present.  Cardiovascular: Normal rate, regular rhythm, S1 normal, S2 normal, normal heart sounds, intact distal pulses and normal pulses.   Pulmonary/Chest: Effort normal and breath sounds normal. No respiratory distress. She has no decreased breath sounds. She has no wheezes. She has no rhonchi. She has no rales.  Abdominal: Normal appearance and bowel sounds are normal. There is no tenderness.  Musculoskeletal: Normal range of motion.  Neurological: She is alert and oriented to person, place, and time.  Skin: Skin is warm and dry.  Psychiatric: She has a normal mood and affect. Her speech is normal and behavior is normal. Thought content normal.  Nursing note and vitals reviewed.  ED Treatments / Results  Labs (all labs ordered are listed, but only abnormal results are displayed) Labs Reviewed - No data to display  EKG  EKG Interpretation None       Radiology No results found.  Procedures Procedures (including critical care time)  DIAGNOSTIC STUDIES: Oxygen Saturation is 97% on RA, normal by my interpretation.    COORDINATION OF CARE: 9:20 PM Discussed treatment plan with pt at bedside and pt agreed to plan.  Medications Ordered in ED Medications - No data to display   Initial Impression / Assessment and Plan / ED Course  I have reviewed the triage vital signs and the nursing notes.  Pertinent labs & imaging results that were available during my care of the patient were reviewed by me and considered in my medical  decision making (see chart for details).  Final Clinical Impressions(s) / ED Diagnoses  I have reviewed the relevant previous healthcare records. I obtained HPI from historian.  ED Course:  Assessment: Patient with symptoms consistent with influenza.  Vitals are stable, low-grade fever.  No signs of dehydration, tolerating PO's.  Lungs are clear. Due to patient's presentation and physical exam a chest x-ray was not ordered bc likely diagnosis of flu.  Discussed the cost versus benefit of Tamiflu treatment with the patient.  Given Rx Tamiflu.  Patient will be discharged with instructions to orally hydrate, rest, and use over-the-counter medications such as anti-inflammatories ibuprofen and Aleve for muscle aches and Tylenol for fever.  Patient will also be given a cough suppressant.   Disposition/Plan:  DC Home Additional Verbal discharge instructions given and discussed with patient.  Pt Instructed to f/u with PCP in the next week for evaluation and treatment of symptoms. Return precautions given Pt acknowledges and agrees with plan  Supervising Physician Nira ConnPedro Eduardo Cardama, MD  Final diagnoses:  Flu-like symptoms    New Prescriptions Discharge Medication List as of 11/29/2016  9:24 PM    START taking these medications   Details  benzonatate (TESSALON) 100 MG capsule Take 1 capsule (100 mg total) by mouth every 8 (eight) hours., Starting Sat 11/29/2016, Print    fluticasone (FLONASE) 50 MCG/ACT nasal spray Place 1 spray into both nostrils daily., Starting Sat 11/29/2016, Print    oseltamivir (TAMIFLU) 75 MG capsule Take 1 capsule (75 mg total) by mouth every 12 (twelve) hours., Starting Sat 11/29/2016, Print        I personally performed the services described in this documentation, which was scribed in my presence. The recorded information has been reviewed and is accurate.    Audry Pili, PA-C 11/29/16 2232    Nira Conn, MD 11/30/16 [phone]

## 2016-11-29 NOTE — Discharge Instructions (Signed)
Please read and follow all provided instructions.  Your diagnoses today include:  1. Flu-like symptoms    Tests performed today include: Vital signs. See below for your results today.   Medications prescribed:   Take any prescribed medications only as directed.  Home care instructions:  Follow any educational materials contained in this packet. Please continue drinking plenty of fluids. Use over-the-counter cold and flu medications as needed as directed on packaging for symptom relief. You may also use ibuprofen or tylenol as directed on packaging for pain or fever.   BE VERY CAREFUL not to take multiple medicines containing Tylenol (also called acetaminophen). Doing so can lead to an overdose which can damage your liver and cause liver failure and possibly death.   Follow-up instructions: Please follow-up with your primary care provider in the next 3 days for further evaluation of your symptoms.   Return instructions:  Please return to the Emergency Department if you experience worsening symptoms. Please return if you have a high fever greater than 101 degrees not controlled with over-the-counter medications, persistent vomiting and cannot keep down fluids, or worsening trouble breathing. Please return if you have any other emergent concerns.  Additional Information:  Your vital signs today were: BP 125/72 (BP Location: Left Arm)    Pulse 105    Temp 97.6 F (36.4 C) (Oral)    Resp 18    Ht 5\' 2"  (1.575 m)    Wt 78 kg    LMP 10/31/2016    SpO2 97%    BMI 31.46 kg/m  If your blood pressure (BP) was elevated above 135/85 this visit, please have this repeated by your doctor within one month.

## 2016-12-02 ENCOUNTER — Encounter (HOSPITAL_COMMUNITY): Payer: Self-pay

## 2016-12-02 ENCOUNTER — Emergency Department (HOSPITAL_COMMUNITY): Payer: No Typology Code available for payment source

## 2016-12-02 ENCOUNTER — Emergency Department (HOSPITAL_COMMUNITY)
Admission: EM | Admit: 2016-12-02 | Discharge: 2016-12-03 | Disposition: A | Discharge status: Home / Self Care | Payer: No Typology Code available for payment source | Attending: Emergency Medicine | Admitting: Emergency Medicine

## 2016-12-02 DIAGNOSIS — Z79899 Other long term (current) drug therapy: Secondary | ICD-10-CM | POA: Insufficient documentation

## 2016-12-02 DIAGNOSIS — Y999 Unspecified external cause status: Secondary | ICD-10-CM | POA: Insufficient documentation

## 2016-12-02 DIAGNOSIS — Y939 Activity, unspecified: Secondary | ICD-10-CM | POA: Insufficient documentation

## 2016-12-02 DIAGNOSIS — F17210 Nicotine dependence, cigarettes, uncomplicated: Secondary | ICD-10-CM | POA: Insufficient documentation

## 2016-12-02 DIAGNOSIS — S161XXA Strain of muscle, fascia and tendon at neck level, initial encounter: Secondary | ICD-10-CM | POA: Insufficient documentation

## 2016-12-02 DIAGNOSIS — S39012A Strain of muscle, fascia and tendon of lower back, initial encounter: Secondary | ICD-10-CM | POA: Diagnosis not present

## 2016-12-02 DIAGNOSIS — Y92481 Parking lot as the place of occurrence of the external cause: Secondary | ICD-10-CM | POA: Diagnosis not present

## 2016-12-02 DIAGNOSIS — S199XXA Unspecified injury of neck, initial encounter: Secondary | ICD-10-CM | POA: Diagnosis present

## 2016-12-02 LAB — POC URINE PREG, ED: PREG TEST UR: NEGATIVE

## 2016-12-02 MED ORDER — HYDROCODONE-ACETAMINOPHEN 5-325 MG PO TABS
1.0000 | ORAL_TABLET | Freq: Once | ORAL | Status: AC
  Administered 2016-12-02: 1 via ORAL
  Filled 2016-12-02: qty 1

## 2016-12-02 MED ORDER — CYCLOBENZAPRINE HCL 10 MG PO TABS
10.0000 mg | ORAL_TABLET | Freq: Once | ORAL | Status: AC
  Administered 2016-12-02: 10 mg via ORAL
  Filled 2016-12-02: qty 1
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
## 2016-12-02 NOTE — ED Notes (Signed)
Patient transported to X-ray 

## 2016-12-02 NOTE — ED Notes (Signed)
See EDP assessment 

## 2016-12-03 MED ORDER — DICLOFENAC SODIUM 50 MG PO TBEC: 50.0000 mg | DELAYED_RELEASE_TABLET | Freq: Two times a day (BID) | ORAL | 0 refills | Status: DC

## 2016-12-03 MED ORDER — METHOCARBAMOL 500 MG PO TABS: 500.0000 mg | ORAL_TABLET | Freq: Two times a day (BID) | ORAL | 0 refills | Status: DC

## 2016-12-03 MED ORDER — HYDROCODONE-ACETAMINOPHEN 5-325 MG PO TABS: 1.0000 | ORAL_TABLET | Freq: Four times a day (QID) | ORAL | 0 refills | Status: DC | PRN

## 2016-12-03 NOTE — Discharge Instructions (Signed)
Do not take the narcotic or muscle relaxant if driving because they will make you sleepy. If your neck pain persist return.

## 2016-12-27 ENCOUNTER — Encounter (HOSPITAL_COMMUNITY): Payer: Self-pay | Admitting: Emergency Medicine

## 2016-12-27 ENCOUNTER — Emergency Department (HOSPITAL_COMMUNITY)
Admission: EM | Admit: 2016-12-27 | Discharge: 2016-12-28 | Disposition: A | Discharge status: Home / Self Care | Payer: Self-pay | Attending: Emergency Medicine | Admitting: Emergency Medicine

## 2016-12-27 DIAGNOSIS — S161XXA Strain of muscle, fascia and tendon at neck level, initial encounter: Secondary | ICD-10-CM | POA: Insufficient documentation

## 2016-12-27 DIAGNOSIS — W108XXA Fall (on) (from) other stairs and steps, initial encounter: Secondary | ICD-10-CM | POA: Insufficient documentation

## 2016-12-27 DIAGNOSIS — Y9301 Activity, walking, marching and hiking: Secondary | ICD-10-CM | POA: Insufficient documentation

## 2016-12-27 DIAGNOSIS — S300XXA Contusion of lower back and pelvis, initial encounter: Secondary | ICD-10-CM | POA: Insufficient documentation

## 2016-12-27 DIAGNOSIS — Z79899 Other long term (current) drug therapy: Secondary | ICD-10-CM | POA: Insufficient documentation

## 2016-12-27 DIAGNOSIS — Y929 Unspecified place or not applicable: Secondary | ICD-10-CM | POA: Insufficient documentation

## 2016-12-27 DIAGNOSIS — Y999 Unspecified external cause status: Secondary | ICD-10-CM | POA: Insufficient documentation

## 2016-12-27 DIAGNOSIS — S0990XA Unspecified injury of head, initial encounter: Secondary | ICD-10-CM | POA: Insufficient documentation

## 2016-12-27 DIAGNOSIS — F17210 Nicotine dependence, cigarettes, uncomplicated: Secondary | ICD-10-CM | POA: Insufficient documentation

## 2016-12-27 NOTE — ED Triage Notes (Addendum)
[facility]0 [facility]/10 back pain, neck pain, right shoulder pain after fell down 4 steps down from stairs, BP 132/74, HR 95, R 26 SPO2 100% on RA.

## 2016-12-28 ENCOUNTER — Emergency Department (HOSPITAL_COMMUNITY): Payer: Self-pay

## 2016-12-28 LAB — BASIC METABOLIC PANEL
Anion gap: 11 (ref 5–15)
BUN: 13 mg/dL (ref 6–20)
CALCIUM: 8.8 mg/dL — AB (ref 8.9–10.3)
CO2: 20 mmol/L — ABNORMAL LOW (ref 22–32)
CREATININE: 0.76 mg/dL (ref 0.44–1.00)
Chloride: 105 mmol/L (ref 101–111)
GFR calc Af Amer: >60 mL/min (ref >60)
GFR calc non Af Amer: >60 mL/min (ref >60)
Glucose, Bld: 91 mg/dL (ref 65–99)
POTASSIUM: 3.7 mmol/L (ref 3.5–5.1)
Sodium: 136 mmol/L (ref 135–145)

## 2016-12-28 LAB — CBC
HCT: 33.2 % — ABNORMAL LOW (ref 36.0–46.0)
Hemoglobin: 11.1 g/dL — ABNORMAL LOW (ref 12.0–15.0)
MCH: 28.5 pg (ref 26.0–34.0)
MCHC: 33.4 g/dL (ref 30.0–36.0)
MCV: 85.1 fL (ref 78.0–100.0)
PLATELETS: 275 10*3/uL (ref 150–400)
RBC: 3.9 MIL/uL (ref 3.87–5.11)
RDW: 15.2 % (ref 11.5–15.5)
WBC: 7.9 10*3/uL (ref 4.0–10.5)

## 2016-12-28 LAB — I-STAT BETA HCG BLOOD, ED (MC, WL, AP ONLY): I-stat hCG, quantitative: <5 m[IU]/mL (ref <5)

## 2016-12-28 MED ORDER — MORPHINE SULFATE (PF) 4 MG/ML IV SOLN
4.0000 mg | Freq: Once | INTRAVENOUS | Status: AC
  Administered 2016-12-28: 4 mg via INTRAVENOUS
  Filled 2016-12-28: qty 1

## 2016-12-28 MED ORDER — HYDROCODONE-ACETAMINOPHEN 5-325 MG PO TABS: 1.0000 | ORAL_TABLET | ORAL | 0 refills | Status: DC | PRN

## 2016-12-28 MED ORDER — ONDANSETRON HCL 4 MG/2ML IJ SOLN
4.0000 mg | Freq: Once | INTRAMUSCULAR | Status: AC
  Administered 2016-12-28: 4 mg via INTRAVENOUS
  Filled 2016-12-28: qty 2

## 2016-12-28 MED ORDER — CYCLOBENZAPRINE HCL 10 MG PO TABS: 10.0000 mg | ORAL_TABLET | Freq: Three times a day (TID) | ORAL | 0 refills | Status: DC | PRN

## 2016-12-28 NOTE — ED Notes (Signed)
Patient transported to CT 

## 2016-12-28 NOTE — ED Provider Notes (Signed)
[facility]-EMERGENCY DEPT Provider Note   CSN: [id number] Arrival date & time: 12/27/16  2353  By signing my name below, I, Karen Warren, attest that this documentation has been prepared under the direction and in the presence of No att. providers found. Electronically Signed: Elder Warren, Scribe. 12/28/16. 11:03 PM.    History   Chief Complaint Chief Complaint  Patient presents with  . Fall    HPI Karen Warren is a 31 y.o. female with history of anemia who presents to the ED following a fall. This patient states that she was walking down stairs when she slipped and fell backwards. Struck her head and back. No LOC. At interview, she is reporting severe pain to "her entire [midline] back" as well as R shoulder pain, posterior neck pain, and posterior head pain. She denies any leg pain. No chest pain. No abdominal pain.   The history is provided by the patient. No language interpreter was used.  Fall  This is a new problem. The current episode started 1 to 2 hours ago. The problem has not changed since onset.Associated symptoms include headaches. Pertinent negatives include no chest pain, no abdominal pain and no shortness of breath. The symptoms are aggravated by bending and twisting. Nothing relieves the symptoms.    Past Medical History:  Diagnosis Date  . Renal disorder    kidney infection    Patient Active Problem List   Diagnosis Date Noted  . Acute pyelonephritis 04/21/2014  . Sepsis (HCC) 04/21/2014  . Diarrhea 06/09/2012  . Leukocytosis 06/07/2012  . Anemia 06/07/2012  . Pyelonephritis 06/06/2012  . Abdominal pain 06/06/2012  . Pain, dental 06/06/2012  . Fever 06/06/2012  . Hypokalemia 06/06/2012    Past Surgical History:  Procedure Laterality Date  . CESAREAN SECTION    . CHOLECYSTECTOMY    . TUBAL LIGATION      OB History    No data available       Home Medications    Prior to Admission medications   Medication Sig Start Date End Date  Taking? Authorizing Provider  fluticasone (FLONASE) 50 MCG/ACT nasal spray Place 1 spray into both nostrils daily. Patient taking differently: Place 1 spray into both nostrils daily as needed for allergies.  11/29/16  Yes Audry Pili, PA-C  cyclobenzaprine (FLEXERIL) 10 MG tablet Take 1 tablet (10 mg total) by mouth 3 (three) times daily as needed for muscle spasms. 12/28/16   Gilda Crease, MD  HYDROcodone-acetaminophen (NORCO/VICODIN) 5-325 MG tablet Take 1 tablet by mouth every 4 (four) hours as needed for moderate pain. 12/28/16   Gilda Crease, MD    Family History Family History  Problem Relation Age of Onset  . Hypertension Mother   . Hypertension Maternal Grandmother   . Diabetes Maternal Grandmother     Social History Social History  Substance Use Topics  . Smoking status: Current Every Day Smoker    Packs/day: 0.30    Types: Cigarettes  . Smokeless tobacco: Never Used  . Alcohol use No     Allergies   Patient has no known allergies.   Review of Systems Review of Systems  Respiratory: Negative for shortness of breath.   Cardiovascular: Negative for chest pain.  Gastrointestinal: Negative for abdominal pain.  Musculoskeletal: Positive for back pain and neck pain.  Neurological: Positive for headaches.  All other systems reviewed and are negative.    Physical Exam Updated Vital Signs BP 104/64   Pulse 74   Temp 98.6 F (37  C) (Oral)   Resp (!) 24   Ht 5\' 2"  (1.575 m)   Wt 170 lb (77.1 kg)   LMP 12/04/2016   SpO2 98%   BMI 31.09 kg/m   Physical Exam  Constitutional: She is oriented to person, place, and time. She appears well-developed and well-nourished. No distress.  HENT:  Head: Normocephalic.  Right Ear: Hearing normal.  Left Ear: Hearing normal.  Nose: Nose normal.  Mouth/Throat: Oropharynx is clear and moist and mucous membranes are normal.  There is tenderness to the occiput without obvious overlying injury.  Eyes: Conjunctivae  and EOM are normal. Pupils are equal, round, and reactive to light.  Neck: Normal range of motion. Neck supple.  Cardiovascular: Regular rhythm, S1 normal and S2 normal.  Exam reveals no gallop and no friction rub.   No murmur heard. Pulmonary/Chest: Effort normal and breath sounds normal. No respiratory distress. She exhibits no tenderness.  Abdominal: Soft. Normal appearance and bowel sounds are normal. There is no hepatosplenomegaly. There is no tenderness. There is no rebound, no guarding, no tenderness at McBurney's point and negative Murphy's sign. No hernia.  Musculoskeletal: Normal range of motion.  There is diffuse tenderness to the entire midline and paraspinal lumbar, thoracic, and cervical spine.  Neurological: She is alert and oriented to person, place, and time. She has normal strength. No cranial nerve deficit or sensory deficit. Coordination normal. GCS eye subscore is 4. GCS verbal subscore is 5. GCS motor subscore is 6.  Skin: Skin is warm, dry and intact. No rash noted. No cyanosis.  Psychiatric: She has a normal mood and affect. Her speech is normal and behavior is normal. Thought content normal.  Nursing note and vitals reviewed.    ED Treatments / Results  Labs (all labs ordered are listed, but only abnormal results are displayed) Labs Reviewed  CBC - Abnormal; Notable for the following:       Result Value   Hemoglobin 11.1 (*)    HCT 33.2 (*)    All other components within normal limits  BASIC METABOLIC PANEL - Abnormal; Notable for the following:    CO2 20 (*)    Calcium 8.8 (*)    All other components within normal limits  I-STAT BETA HCG BLOOD, ED ([facility], [facility], [facility] ONLY)    EKG  EKG Interpretation None       Radiology Dg Chest 1 View  Result Date: 12/28/2016 CLINICAL DATA:  Pain after falling. EXAM: CHEST 1 VIEW COMPARISON:  None. FINDINGS: The heart size and mediastinal contours are within normal limits. Both lungs are clear. The visualized skeletal  structures are unremarkable. IMPRESSION: No radiographically evident acute thoracic injury. Electronically Signed   By: Deatra Robinson M.D.   On: 12/28/2016 02:54   Dg Thoracic Spine 2 View  Result Date: 12/28/2016 CLINICAL DATA:  Back pain after fall EXAM: THORACIC SPINE 2 VIEWS COMPARISON:  Thoracic spine radiographs 01/29/2015 FINDINGS: There is no evidence of thoracic spine fracture. Alignment is normal. No other significant bone abnormalities are identified. IMPRESSION: No fracture or listhesis of the thoracic spine. Electronically Signed   By: Deatra Robinson M.D.   On: 12/28/2016 02:52   Dg Lumbar Spine Complete  Result Date: 12/28/2016 CLINICAL DATA:  Back pain after fall EXAM: LUMBAR SPINE - COMPLETE 4+ VIEW COMPARISON:  Lumbar spine radiograph 12/02/2014 FINDINGS: Five non-rib-bearing lumbar vertebral bodies. No pars interarticularis defect. Alignment of the lumbar spine is normal. Vertebral body heights and disc spaces are preserved. Normal facet  articulations. IMPRESSION: Normal lumbar spine radiographs. Electronically Signed   By: Deatra RobinsonKevin  Herman M.D.   On: 12/28/2016 02:52   Dg Shoulder Right  Result Date: 12/28/2016 CLINICAL DATA:  Bilateral shoulder pain after falling EXAM: RIGHT SHOULDER - 2+ VIEW COMPARISON:  None. FINDINGS: There is no evidence of fracture or dislocation. There is no evidence of arthropathy or other focal bone abnormality. Soft tissues are unremarkable. IMPRESSION: Normal right shoulder. Electronically Signed   By: Deatra RobinsonKevin  Herman M.D.   On: 12/28/2016 02:53   Ct Head Wo Contrast  Result Date: 12/28/2016 CLINICAL DATA:  Fall with posterior head injury and neck pain. EXAM: CT HEAD WITHOUT CONTRAST CT CERVICAL SPINE WITHOUT CONTRAST TECHNIQUE: Multidetector CT imaging of the head and cervical spine was performed following the standard protocol without intravenous contrast. Multiplanar CT image reconstructions of the cervical spine were also generated. COMPARISON:  CT head  and cervical spine 03/02/2016 FINDINGS: CT HEAD FINDINGS Brain: No mass lesion, intraparenchymal hemorrhage or extra-axial collection. No evidence of acute cortical infarct. Brain parenchyma and CSF-containing spaces are normal for age. Vascular: No hyperdense vessel or unexpected calcification. Skull: Normal visualized skull base, calvarium and extracranial soft tissues. Sinuses/Orbits: No sinus fluid levels or advanced mucosal thickening. No mastoid effusion. Normal orbits. CT CERVICAL SPINE FINDINGS Alignment: No static subluxation. Facets are aligned. Occipital condyles are normally positioned. Skull base and vertebrae: No acute fracture. Soft tissues and spinal canal: No prevertebral fluid or swelling. No visible canal hematoma. Disc levels: No advanced spinal canal or neural foraminal stenosis. Upper chest: No pneumothorax, pulmonary nodule or pleural effusion. Other: Normal visualized paraspinal cervical soft tissues. IMPRESSION: 1. Normal head CT. 2. No acute fracture or static subluxation of the cervical spine. Electronically Signed   By: Deatra RobinsonKevin  Herman M.D.   On: 12/28/2016 02:21   Ct Cervical Spine Wo Contrast  Result Date: 12/28/2016 CLINICAL DATA:  Fall with posterior head injury and neck pain. EXAM: CT HEAD WITHOUT CONTRAST CT CERVICAL SPINE WITHOUT CONTRAST TECHNIQUE: Multidetector CT imaging of the head and cervical spine was performed following the standard protocol without intravenous contrast. Multiplanar CT image reconstructions of the cervical spine were also generated. COMPARISON:  CT head and cervical spine 03/02/2016 FINDINGS: CT HEAD FINDINGS Brain: No mass lesion, intraparenchymal hemorrhage or extra-axial collection. No evidence of acute cortical infarct. Brain parenchyma and CSF-containing spaces are normal for age. Vascular: No hyperdense vessel or unexpected calcification. Skull: Normal visualized skull base, calvarium and extracranial soft tissues. Sinuses/Orbits: No sinus fluid  levels or advanced mucosal thickening. No mastoid effusion. Normal orbits. CT CERVICAL SPINE FINDINGS Alignment: No static subluxation. Facets are aligned. Occipital condyles are normally positioned. Skull base and vertebrae: No acute fracture. Soft tissues and spinal canal: No prevertebral fluid or swelling. No visible canal hematoma. Disc levels: No advanced spinal canal or neural foraminal stenosis. Upper chest: No pneumothorax, pulmonary nodule or pleural effusion. Other: Normal visualized paraspinal cervical soft tissues. IMPRESSION: 1. Normal head CT. 2. No acute fracture or static subluxation of the cervical spine. Electronically Signed   By: Deatra RobinsonKevin  Herman M.D.   On: 12/28/2016 02:21   Dg Shoulder Left  Result Date: 12/28/2016 CLINICAL DATA:  Upper and lower back pain, bilateral shoulder pain, and chest pain after fall down 4 steps tonight. EXAM: LEFT SHOULDER - 2+ VIEW COMPARISON:  03/02/2016 FINDINGS: There is no evidence of fracture or dislocation. There is no evidence of arthropathy or other focal bone abnormality. Soft tissues are unremarkable. IMPRESSION: Negative. Electronically Signed  By: Burman Nieves M.D.   On: 12/28/2016 02:54    Procedures Procedures (including critical care time)  Medications Ordered in ED Medications  ondansetron (ZOFRAN) injection 4 mg (4 mg Intravenous Given 12/28/16 0027)  morphine 4 MG/ML injection 4 mg (4 mg Intravenous Given 12/28/16 0027)  morphine 4 MG/ML injection 4 mg (4 mg Intravenous Given 12/28/16 0145)     Initial Impression / Assessment and Plan / ED Course  I have reviewed the triage vital signs and the nursing notes.  Pertinent labs & imaging results that were available during my care of the patient were reviewed by me and considered in my medical decision making (see chart for details).     Patient presents to the emergency department for evaluation after a fall. Patient reports that she was walking down steps and slipped. Patient  reports that she slid on her back on the steps, complaining of headache, neck pain and diffuse back pain. Examination did not reveal any evidence of deformity. Back examination revealed diffuse soft tissue and midline tenderness without focal findings. No significant bruising, abrasions, contusions noted. CT head and cervical spine negative. X-ray of bilateral shoulders, thoracic spine, lumbar spine negative. Workup and examination consistent with contusions. Patient provided analgesia in the ER and will continue analgesia and rest as an outpatient.  Final Clinical Impressions(s) / ED Diagnoses   Final diagnoses:  Injury of head, initial encounter  Contusion of lower back, initial encounter  Cervical strain, acute, initial encounter    New Prescriptions Discharge Medication List as of 12/28/2016  3:16 AM    I personally performed the services described in this documentation, which was scribed in my presence. The recorded information has been reviewed and is accurate.    Gilda Crease, MD 12/28/16 [phone]

## 2017-01-05 ENCOUNTER — Encounter (HOSPITAL_COMMUNITY): Payer: Self-pay | Admitting: *Deleted

## 2017-01-05 ENCOUNTER — Emergency Department (HOSPITAL_COMMUNITY): Payer: No Typology Code available for payment source

## 2017-01-05 ENCOUNTER — Emergency Department (HOSPITAL_COMMUNITY)
Admission: EM | Admit: 2017-01-05 | Discharge: 2017-01-05 | Disposition: A | Discharge status: Home / Self Care | Payer: No Typology Code available for payment source | Attending: Emergency Medicine | Admitting: Emergency Medicine

## 2017-01-05 DIAGNOSIS — R51 Headache: Secondary | ICD-10-CM | POA: Diagnosis not present

## 2017-01-05 DIAGNOSIS — S161XXA Strain of muscle, fascia and tendon at neck level, initial encounter: Secondary | ICD-10-CM | POA: Insufficient documentation

## 2017-01-05 DIAGNOSIS — Y939 Activity, unspecified: Secondary | ICD-10-CM | POA: Diagnosis not present

## 2017-01-05 DIAGNOSIS — Y999 Unspecified external cause status: Secondary | ICD-10-CM | POA: Diagnosis not present

## 2017-01-05 DIAGNOSIS — R935 Abnormal findings on diagnostic imaging of other abdominal regions, including retroperitoneum: Secondary | ICD-10-CM | POA: Insufficient documentation

## 2017-01-05 DIAGNOSIS — F17210 Nicotine dependence, cigarettes, uncomplicated: Secondary | ICD-10-CM | POA: Diagnosis not present

## 2017-01-05 DIAGNOSIS — S199XXA Unspecified injury of neck, initial encounter: Secondary | ICD-10-CM | POA: Diagnosis present

## 2017-01-05 DIAGNOSIS — M5410 Radiculopathy, site unspecified: Secondary | ICD-10-CM

## 2017-01-05 DIAGNOSIS — Y92410 Unspecified street and highway as the place of occurrence of the external cause: Secondary | ICD-10-CM | POA: Diagnosis not present

## 2017-01-05 DIAGNOSIS — S39012A Strain of muscle, fascia and tendon of lower back, initial encounter: Secondary | ICD-10-CM | POA: Insufficient documentation

## 2017-01-05 LAB — I-STAT CHEM 8, ED
BUN: 14 mg/dL (ref 6–20)
CALCIUM ION: 1.15 mmol/L (ref 1.15–1.40)
CHLORIDE: 107 mmol/L (ref 101–111)
Creatinine, Ser: 0.7 mg/dL (ref 0.44–1.00)
Glucose, Bld: 85 mg/dL (ref 65–99)
HCT: 32 % — ABNORMAL LOW (ref 36.0–46.0)
HEMOGLOBIN: 10.9 g/dL — AB (ref 12.0–15.0)
POTASSIUM: 3.7 mmol/L (ref 3.5–5.1)
SODIUM: 141 mmol/L (ref 135–145)
TCO2: 25 mmol/L (ref 0–100)

## 2017-01-05 LAB — I-STAT BETA HCG BLOOD, ED (MC, WL, AP ONLY)

## 2017-01-05 MED ORDER — KETOROLAC TROMETHAMINE 15 MG/ML IJ SOLN
15.0000 mg | Freq: Once | INTRAMUSCULAR | Status: AC
  Administered 2017-01-05: 15 mg via INTRAVENOUS
  Filled 2017-01-05: qty 1

## 2017-01-05 MED ORDER — HYDROMORPHONE HCL 1 MG/ML IJ SOLN
1.0000 mg | Freq: Once | INTRAMUSCULAR | Status: AC
Start: 2017-01-05 — End: 2017-01-05
  Administered 2017-01-05: 1 mg via INTRAVENOUS
  Filled 2017-01-05: qty 1

## 2017-01-05 MED ORDER — TRAMADOL HCL 50 MG PO TABS: 50.0000 mg | ORAL_TABLET | Freq: Three times a day (TID) | ORAL | 0 refills | Status: DC | PRN

## 2017-01-05 MED ORDER — IBUPROFEN 600 MG PO TABS: 600.0000 mg | ORAL_TABLET | Freq: Four times a day (QID) | ORAL | 0 refills | Status: DC | PRN

## 2017-01-05 MED ORDER — METHOCARBAMOL 500 MG PO TABS: 500.0000 mg | ORAL_TABLET | Freq: Two times a day (BID) | ORAL | 0 refills | Status: DC

## 2017-01-05 MED ORDER — METHOCARBAMOL 500 MG PO TABS
500.0000 mg | ORAL_TABLET | Freq: Once | ORAL | Status: AC
  Administered 2017-01-05: 500 mg via ORAL
  Filled 2017-01-05: qty 1

## 2017-01-05 MED ORDER — MORPHINE SULFATE (PF) 4 MG/ML IV SOLN
8.0000 mg | Freq: Once | INTRAVENOUS | Status: AC
  Administered 2017-01-05: 8 mg via INTRAVENOUS
  Filled 2017-01-05: qty 2

## 2017-01-05 MED ORDER — LORAZEPAM 2 MG/ML IJ SOLN
1.0000 mg | Freq: Once | INTRAMUSCULAR | Status: AC
  Administered 2017-01-05: 1 mg via INTRAVENOUS
  Filled 2017-01-05: qty 1

## 2017-01-05 MED ORDER — ACETAMINOPHEN 325 MG PO TABS: 650.0000 mg | ORAL_TABLET | Freq: Four times a day (QID) | ORAL | 0 refills | Status: DC | PRN

## 2017-01-05 NOTE — Discharge Instructions (Signed)
Please take the medicine prescribed. Try to be as active as possible.  REMOVE THE CERVICAL COLLAR SOON - IT WILL BE DETRIMENTAL TO YOU TO LEAVE IT ON LONG TERM -AS YOU NECK WILL FREEZE.

## 2017-01-05 NOTE — ED Notes (Signed)
Got patient on the monitor patient is waiting on provider

## 2017-01-05 NOTE — ED Notes (Signed)
Patient transported to CT 

## 2017-01-05 NOTE — ED Notes (Signed)
Patient transported to MRI 

## 2017-01-05 NOTE — ED Notes (Signed)
Aspen Collar placed on pt.

## 2017-01-05 NOTE — ED Notes (Signed)
Hooked patient back up to the monitor patient is resting 
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
## 2017-01-05 NOTE — ED Notes (Signed)
Hooked patient back up to the monitor 
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
## 2017-01-05 NOTE — ED Notes (Signed)
Pt is in stable condition upon d/c and is escorted from ED via wheelchair. 

## 2017-01-09 ENCOUNTER — Encounter (HOSPITAL_COMMUNITY): Payer: Self-pay | Admitting: Neurology

## 2017-01-09 ENCOUNTER — Emergency Department (HOSPITAL_COMMUNITY)
Admission: EM | Admit: 2017-01-09 | Discharge: 2017-01-09 | Disposition: A | Discharge status: Home / Self Care | Payer: Self-pay | Attending: Emergency Medicine | Admitting: Emergency Medicine

## 2017-01-09 DIAGNOSIS — S4992XA Unspecified injury of left shoulder and upper arm, initial encounter: Secondary | ICD-10-CM | POA: Insufficient documentation

## 2017-01-09 DIAGNOSIS — S4991XA Unspecified injury of right shoulder and upper arm, initial encounter: Secondary | ICD-10-CM | POA: Insufficient documentation

## 2017-01-09 DIAGNOSIS — Y92410 Unspecified street and highway as the place of occurrence of the external cause: Secondary | ICD-10-CM | POA: Insufficient documentation

## 2017-01-09 DIAGNOSIS — Y9389 Activity, other specified: Secondary | ICD-10-CM | POA: Insufficient documentation

## 2017-01-09 DIAGNOSIS — Y999 Unspecified external cause status: Secondary | ICD-10-CM | POA: Insufficient documentation
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
## 2017-01-09 NOTE — Discharge Instructions (Signed)
You can take extra strength Tylenol as directed every 4 hours along with ibuprofen 800 mg 3 times daily as needed for pain. If not feeling better by next week go to an urgent care center. Return if concern for any reason
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
## 2017-07-05 ENCOUNTER — Encounter (HOSPITAL_BASED_OUTPATIENT_CLINIC_OR_DEPARTMENT_OTHER): Payer: Self-pay | Admitting: *Deleted

## 2017-07-05 ENCOUNTER — Emergency Department (HOSPITAL_BASED_OUTPATIENT_CLINIC_OR_DEPARTMENT_OTHER)
Admission: EM | Admit: 2017-07-05 | Discharge: 2017-07-05 | Disposition: A | Discharge status: Home / Self Care | Payer: Self-pay | Attending: Physician Assistant | Admitting: Physician Assistant

## 2017-07-05 DIAGNOSIS — Z79899 Other long term (current) drug therapy: Secondary | ICD-10-CM | POA: Insufficient documentation

## 2017-07-05 DIAGNOSIS — R519 Headache, unspecified: Secondary | ICD-10-CM

## 2017-07-05 DIAGNOSIS — R51 Headache: Secondary | ICD-10-CM | POA: Insufficient documentation

## 2017-07-05 DIAGNOSIS — F17210 Nicotine dependence, cigarettes, uncomplicated: Secondary | ICD-10-CM | POA: Insufficient documentation

## 2017-07-05 MED ORDER — KETOROLAC TROMETHAMINE 30 MG/ML IJ SOLN
30.0000 mg | Freq: Once | INTRAMUSCULAR | Status: AC
  Administered 2017-07-05: 30 mg via INTRAVENOUS
  Filled 2017-07-05: qty 1

## 2017-07-05 MED ORDER — SODIUM CHLORIDE 0.9 % IV BOLUS (SEPSIS)
1000.0000 mL | Freq: Once | INTRAVENOUS | Status: AC
  Administered 2017-07-05: 1000 mL via INTRAVENOUS

## 2017-07-05 MED ORDER — DIPHENHYDRAMINE HCL 50 MG/ML IJ SOLN
25.0000 mg | Freq: Once | INTRAMUSCULAR | Status: AC
  Administered 2017-07-05: 25 mg via INTRAVENOUS
  Filled 2017-07-05: qty 1

## 2017-07-05 MED ORDER — PROCHLORPERAZINE EDISYLATE 5 MG/ML IJ SOLN
10.0000 mg | Freq: Once | INTRAMUSCULAR | Status: AC
  Administered 2017-07-05: 10 mg via INTRAVENOUS
  Filled 2017-07-05: qty 2

## 2017-07-05 NOTE — ED Provider Notes (Signed)
[facility]   CSN: [id number] Arrival date & time: 07/05/17  4098     History   Chief Complaint Chief Complaint  Patient presents with  . Headache    HPI Karen Warren is a 31 y.o. female.  HPI  Patient's 31 year old female presenting with headache. She reports having headaches for last year. She reports this one she's not been able to get rid of. She taken Excedrin and ibuprofen. She states sometimes that she has some sensitivity light sensitivity and occasional blurry vision. This headaches been going on for 2 weeks. It's been getting worse she feels. Patient has not seen a specialist.  Patient also reports tightness in her neck. No fevers. Systemically feels well otherwise. Currently has no neurologic complaints.   Past Medical History:  Diagnosis Date  . Renal disorder    kidney infection    Patient Active Problem List   Diagnosis Date Noted  . Acute pyelonephritis 04/21/2014  . Sepsis (HCC) 04/21/2014  . Diarrhea 06/09/2012  . Leukocytosis 06/07/2012  . Anemia 06/07/2012  . Pyelonephritis 06/06/2012  . Abdominal pain 06/06/2012  . Pain, dental 06/06/2012  . Fever 06/06/2012  . Hypokalemia 06/06/2012    Past Surgical History:  Procedure Laterality Date  . CESAREAN SECTION    . CHOLECYSTECTOMY    . TUBAL LIGATION      OB History    No data available       Home Medications    Prior to Admission medications   Medication Sig Start Date End Date Taking? Authorizing Provider  acetaminophen (TYLENOL) 325 MG tablet Take 2 tablets (650 mg total) by mouth every 6 (six) hours as needed. 01/05/17   Derwood Kaplan, MD  cyclobenzaprine (FLEXERIL) 10 MG tablet Take 1 tablet (10 mg total) by mouth 3 (three) times daily as needed for muscle spasms. 12/28/16   Gilda Crease, MD  fluticasone (FLONASE) 50 MCG/ACT nasal spray Place 1 spray into both nostrils daily. Patient taking differently: Place 1 spray into both nostrils daily as  needed for allergies.  11/29/16   Audry Pili, PA-C  HYDROcodone-acetaminophen (NORCO/VICODIN) 5-325 MG tablet Take 1 tablet by mouth every 4 (four) hours as needed for moderate pain. 12/28/16   Gilda Crease, MD  ibuprofen (ADVIL,MOTRIN) 600 MG tablet Take 1 tablet (600 mg total) by mouth every 6 (six) hours as needed. 01/05/17   Derwood Kaplan, MD  methocarbamol (ROBAXIN) 500 MG tablet Take 1 tablet (500 mg total) by mouth 2 (two) times daily. 01/05/17   Derwood Kaplan, MD  traMADol (ULTRAM) 50 MG tablet Take 1 tablet (50 mg total) by mouth every 8 (eight) hours as needed. 01/05/17   Derwood Kaplan, MD    Family History Family History  Problem Relation Age of Onset  . Hypertension Mother   . Hypertension Maternal Grandmother   . Diabetes Maternal Grandmother     Social History Social History  Substance Use Topics  . Smoking status: Current Every Day Smoker    Packs/day: 0.30    Types: Cigarettes  . Smokeless tobacco: Never Used  . Alcohol use No     Allergies   Patient has no known allergies.   Review of Systems Review of Systems  Constitutional: Negative for activity change, fatigue and fever.  Eyes: Positive for photophobia.  Respiratory: Negative for shortness of breath.   Cardiovascular: Negative for chest pain.  Gastrointestinal: Negative for abdominal pain.  Neurological: Positive for headaches.     Physical Exam Updated  Vital Signs BP 125/77 (BP Location: Right Arm)   Pulse 89   Temp 98.5 F (36.9 C) (Oral)   Resp 16   Ht  (1.575 m)   Wt 74.8 kg (165 lb)   LMP 06/21/2017 (Approximate)   SpO2 100%   BMI 30.18 kg/m   Physical Exam  Constitutional: She is oriented to person, place, and time. She appears well-developed and well-nourished.  HENT:  Head: Normocephalic and atraumatic.  Eyes: Right eye exhibits no discharge.  Neck:  Tightness in left trap  Cardiovascular: Normal rate, regular rhythm and normal heart sounds.   No murmur  heard. Pulmonary/Chest: Effort normal and breath sounds normal. She has no wheezes. She has no rales.  Abdominal: Soft. She exhibits no distension. There is no tenderness.  Neurological: She is oriented to person, place, and time. No cranial nerve deficit. Coordination normal.  Skin: Skin is warm and dry. She is not diaphoretic.  Psychiatric: She has a normal mood and affect.  Nursing note and vitals reviewed.    ED Treatments / Results  Labs (all labs ordered are listed, but only abnormal results are displayed) Labs Reviewed - No data to display  EKG  EKG Interpretation None       Radiology No results found.  Procedures Procedures (including critical care time)  Medications Ordered in ED Medications  prochlorperazine (COMPAZINE) injection 10 mg (not administered)  ketorolac (TORADOL) 30 MG/ML injection 30 mg (not administered)  sodium chloride 0.9 % bolus 1,000 mL (not administered)  diphenhydrAMINE (BENADRYL) injection 25 mg (not administered)     Initial Impression / Assessment and Plan / ED Course  I have reviewed the triage vital signs and the nursing notes.  Pertinent labs & imaging results that were available during my care of the patient were reviewed by me and considered in my medical decision making (see chart for details).     Well-appearing 31 year old presenting with headaches. Patient had this going on for the last year. She feels that this was worse than usual. We'll give her IV treatment. We'll give her follow-up with a migraine specialist.  Do not suspect any kind of intracranial hemorrhage, cerebral venous sinus thrombosis, or infectious process given the presentation.  10:01 AM Headache better after therapy. Will have her follow up with neurology as needed  Final Clinical Impressions(s) / ED Diagnoses   Final diagnoses:  None    New Prescriptions New Prescriptions   No medications on file     Abelino Derrick, MD 07/05/17  1001

## 2017-07-05 NOTE — ED Notes (Signed)
Pt educated about not driving or performing other critical tasks (such as operating heavy machinery, caring for infant/toddler/child) due to sedative nature of medications received in ED. Also warned about risks of consuming alcohol or taking other medications with sedative properties. Pt/caregiver verbalized understanding.  

## 2017-07-05 NOTE — ED Notes (Signed)
ED Provider at bedside. 

## 2017-07-05 NOTE — Discharge Instructions (Signed)
Follow up with a neurologist about your headaches.

## 2017-07-05 NOTE — ED Triage Notes (Signed)
Pt reports headache x2wks (hx of same). States OTC meds ineffective. Reports light and sound sensitivity with associated blurry vision, nausea. Denies v/d, dizziness, numbness/tingling.

## 2017-08-15 ENCOUNTER — Emergency Department (HOSPITAL_COMMUNITY): Payer: Self-pay

## 2017-08-15 ENCOUNTER — Encounter (HOSPITAL_COMMUNITY): Payer: Self-pay | Admitting: Emergency Medicine

## 2017-08-15 ENCOUNTER — Emergency Department (HOSPITAL_COMMUNITY)
Admission: EM | Admit: 2017-08-15 | Discharge: 2017-08-15 | Disposition: A | Discharge status: Home / Self Care | Payer: Self-pay | Attending: Emergency Medicine | Admitting: Emergency Medicine

## 2017-08-15 DIAGNOSIS — F17210 Nicotine dependence, cigarettes, uncomplicated: Secondary | ICD-10-CM | POA: Insufficient documentation

## 2017-08-15 DIAGNOSIS — M25561 Pain in right knee: Secondary | ICD-10-CM | POA: Insufficient documentation

## 2017-08-15 MED ORDER — HYDROMORPHONE HCL 1 MG/ML IJ SOLN
1.0000 mg | Freq: Once | INTRAMUSCULAR | Status: AC
  Administered 2017-08-15: 1 mg via INTRAMUSCULAR
  Filled 2017-08-15: qty 1

## 2017-08-15 MED ORDER — OXYCODONE-ACETAMINOPHEN 5-325 MG PO TABS: 1.0000 | ORAL_TABLET | ORAL | 0 refills | Status: DC | PRN

## 2017-08-15 NOTE — Discharge Instructions (Signed)
X-ray of your knee showed no fracture.  However, you have pain and swelling in the knee which could be many different causes.  Ice, elevate, knee immobilizer, crutches, pain medicine, referral to orthopedics.  Phone number given.  Call Monday for appointment

## 2017-08-15 NOTE — ED Triage Notes (Signed)
Pt reports a popping sensation in r/knee last night. Pt reports increased pain and swelling since episode. Tx with Motrin x 2  at 2am.

## 2017-08-15 NOTE — ED Provider Notes (Signed)
[facility]-EMERGENCY DEPT Provider Note   CSN: [id number] Arrival date & time: 08/15/17  4098     History   Chief Complaint Chief Complaint  Patient presents with  . Knee Pain    HPI Karen Warren is a 31 y.o. female.  Popping sensation in right knee at work last night.  Now with pain surrounding the entire knee.  No other injury.  No previous knee pathology.  Severity of pain is moderate to severe.  Range of motion makes pain worse      Past Medical History:  Diagnosis Date  . Renal disorder    kidney infection    Patient Active Problem List   Diagnosis Date Noted  . Acute pyelonephritis 04/21/2014  . Sepsis (HCC) 04/21/2014  . Diarrhea 06/09/2012  . Leukocytosis 06/07/2012  . Anemia 06/07/2012  . Pyelonephritis 06/06/2012  . Abdominal pain 06/06/2012  . Pain, dental 06/06/2012  . Fever 06/06/2012  . Hypokalemia 06/06/2012    Past Surgical History:  Procedure Laterality Date  . CESAREAN SECTION    . CHOLECYSTECTOMY    . TUBAL LIGATION    . WISDOM TOOTH EXTRACTION      OB History    No data available       Home Medications    Prior to Admission medications   Medication Sig Start Date End Date Taking? Authorizing Provider  acetaminophen (TYLENOL) 325 MG tablet Take 2 tablets (650 mg total) by mouth every 6 (six) hours as needed. 01/05/17   Derwood Kaplan, MD  cyclobenzaprine (FLEXERIL) 10 MG tablet Take 1 tablet (10 mg total) by mouth 3 (three) times daily as needed for muscle spasms. 12/28/16   Gilda Crease, MD  fluticasone (FLONASE) 50 MCG/ACT nasal spray Place 1 spray into both nostrils daily. Patient taking differently: Place 1 spray into both nostrils daily as needed for allergies.  11/29/16   Audry Pili, PA-C  HYDROcodone-acetaminophen (NORCO/VICODIN) 5-325 MG tablet Take 1 tablet by mouth every 4 (four) hours as needed for moderate pain. 12/28/16   Gilda Crease, MD  ibuprofen (ADVIL,MOTRIN) 600 MG  tablet Take 1 tablet (600 mg total) by mouth every 6 (six) hours as needed. 01/05/17   Derwood Kaplan, MD  methocarbamol (ROBAXIN) 500 MG tablet Take 1 tablet (500 mg total) by mouth 2 (two) times daily. 01/05/17   Derwood Kaplan, MD  oxyCODONE-acetaminophen (PERCOCET) 5-325 MG tablet Take 1 tablet by mouth every 4 (four) hours as needed. 08/15/17   Donnetta Hutching, MD  traMADol (ULTRAM) 50 MG tablet Take 1 tablet (50 mg total) by mouth every 8 (eight) hours as needed. 01/05/17   Derwood Kaplan, MD    Family History Family History  Problem Relation Age of Onset  . Hypertension Mother   . Hypertension Maternal Grandmother   . Diabetes Maternal Grandmother     Social History Social History  Substance Use Topics  . Smoking status: Current Every Day Smoker    Packs/day: 0.30    Types: Cigarettes  . Smokeless tobacco: Never Used  . Alcohol use No     Allergies   Patient has no known allergies.   Review of Systems Review of Systems  All other systems reviewed and are negative.    Physical Exam Updated Vital Signs BP 102/72 (BP Location: Left Arm)   Pulse 96   Temp 99.6 F (37.6 C) (Oral)   Resp 18   Wt 72.6 kg (160 lb)   LMP 07/15/2017 (Approximate)   SpO2 100%  BMI 29.26 kg/m   Physical Exam  Constitutional: She is oriented to person, place, and time. She appears well-developed and well-nourished.  HENT:  Head: Normocephalic and atraumatic.  Eyes: Conjunctivae are normal.  Neck: Neck supple.  Musculoskeletal:  Right knee: Obvious swelling in the knee.  Patient unable to flex secondary to pain.  Most tender in the superior anterior aspect of the knee  Neurological: She is alert and oriented to person, place, and time.  Skin: Skin is warm and dry.  Psychiatric: She has a normal mood and affect. Her behavior is normal.  Nursing note and vitals reviewed.    ED Treatments / Results  Labs (all labs ordered are listed, but only abnormal results are displayed) Labs  Reviewed - No data to display  EKG  EKG Interpretation None       Radiology Dg Knee Complete 4 Views Right  Result Date: 08/15/2017 CLINICAL DATA:  31 year old female with popping sensation in knee last night with increasing pain and swelling since then. EXAM: RIGHT KNEE - COMPLETE 4+ VIEW COMPARISON:  None. FINDINGS: No evidence of fracture, dislocation, or joint effusion. No evidence of arthropathy or other focal bone abnormality. Soft tissues are unremarkable. IMPRESSION: Negative. Electronically Signed   By: Sande BrothersSerena  Chacko M.D.   On: 08/15/2017 10:53    Procedures Procedures (including critical care time)  Medications Ordered in ED Medications  HYDROmorphone (DILAUDID) injection 1 mg (1 mg Intramuscular Given 08/15/17 1109)     Initial Impression / Assessment and Plan / ED Course  I have reviewed the triage vital signs and the nursing notes.  Pertinent labs & imaging results that were available during my care of the patient were reviewed by me and considered in my medical decision making (see chart for details).     Patient experienced a popping sensation in the right knee.  Plain films showed no fracture.  This could be a ligamentous tear or meniscus injury.  I offered an arthrocentesis.  She preferred not to do this procedure.  Will Rx Dilaudid intramuscular.  Discharge instructions: Ice, knee immobilizer, crutches, Percocet, referral to orthopedics.  Final Clinical Impressions(s) / ED Diagnoses   Final diagnoses:  Acute pain of right knee    New Prescriptions New Prescriptions   OXYCODONE-ACETAMINOPHEN (PERCOCET) 5-325 MG TABLET    Take 1 tablet by mouth every 4 (four) hours as needed.     Donnetta Hutchingook, Nikholas Geffre, MD 08/15/17 1140

## 2017-08-15 NOTE — ED Notes (Signed)
Bed: WTR6 Expected date:  Expected time:  Means of arrival:  Comments: 

## 2017-09-26 ENCOUNTER — Other Ambulatory Visit: Payer: Self-pay

## 2017-09-26 ENCOUNTER — Emergency Department (HOSPITAL_BASED_OUTPATIENT_CLINIC_OR_DEPARTMENT_OTHER)
Admission: EM | Admit: 2017-09-26 | Discharge: 2017-09-26 | Disposition: A | Discharge status: Home / Self Care | Payer: Self-pay | Attending: Emergency Medicine | Admitting: Emergency Medicine

## 2017-09-26 ENCOUNTER — Encounter (HOSPITAL_BASED_OUTPATIENT_CLINIC_OR_DEPARTMENT_OTHER): Payer: Self-pay | Admitting: Emergency Medicine

## 2017-09-26 DIAGNOSIS — F17210 Nicotine dependence, cigarettes, uncomplicated: Secondary | ICD-10-CM | POA: Insufficient documentation

## 2017-09-26 DIAGNOSIS — K029 Dental caries, unspecified: Secondary | ICD-10-CM | POA: Insufficient documentation

## 2017-09-26 DIAGNOSIS — Z79899 Other long term (current) drug therapy: Secondary | ICD-10-CM | POA: Insufficient documentation

## 2017-09-26 MED ORDER — DEXAMETHASONE SODIUM PHOSPHATE 10 MG/ML IJ SOLN
10.0000 mg | Freq: Once | INTRAMUSCULAR | Status: AC
  Administered 2017-09-26: 10 mg via INTRAMUSCULAR
  Filled 2017-09-26: qty 1

## 2017-09-26 MED ORDER — HYDROCODONE-ACETAMINOPHEN 7.5-325 MG/15ML PO SOLN: 10.0000 mL | ORAL | 0 refills | Status: AC | PRN

## 2017-09-26 MED ORDER — AMOXICILLIN 400 MG/5ML PO SUSR: 500.0000 mg | Freq: Three times a day (TID) | ORAL | 0 refills | Status: DC

## 2017-09-26 NOTE — ED Triage Notes (Signed)
Pt presents with right upper dental pain for 3-4 days. PT states she is having trouble swallowing due to pain

## 2017-09-26 NOTE — ED Notes (Signed)
Pt discharged to home NAD.  

## 2017-09-26 NOTE — ED Provider Notes (Signed)
[facility] Provider Note: Karen Warren Karen Lengel, MD, FACEP  CSN: [id number] MRN: [id number] ARRIVAL: 09/26/17 at 0430 ROOM: MH11/MH11   CHIEF COMPLAINT  Dental Pain   HISTORY OF PRESENT ILLNESS  09/26/17 5:06 AM Karen Warren is a 31 y.o. female with a 3-4-day history of pain in her right upper second molar.  This molar is carious.  For the past 2 days she has had difficulty swallowing which she attributes to this pain.  She rates her pain as a 9 out of 10, worse with eating or swallowing.  She has right anterior cervical lymphadenopathy as well.  She has not gotten relief with over-the-counter analgesics.  [place] [facility] [place] reveals the patient has received 5 opioid prescriptions in the past year.   Past Medical History:  Diagnosis Date  . Renal disorder    kidney infection    Past Surgical History:  Procedure Laterality Date  . CESAREAN SECTION    . CHOLECYSTECTOMY    . TUBAL LIGATION    . WISDOM TOOTH EXTRACTION      Family History  Problem Relation Age of Onset  . Hypertension Mother   . Hypertension Maternal Grandmother   . Diabetes Maternal Grandmother     Social History   Tobacco Use  . Smoking status: Current Every Day Smoker    Packs/day: 0.30    Types: Cigarettes  . Smokeless tobacco: Never Used  Substance Use Topics  . Alcohol use: No  . Drug use: No    Prior to Admission medications   Medication Sig Start Date End Date Taking? Authorizing Provider  acetaminophen (TYLENOL) 325 MG tablet Take 2 tablets (650 mg total) by mouth every 6 (six) hours as needed. 01/05/17   Derwood KaplanNanavati, Ankit, MD  cyclobenzaprine (FLEXERIL) 10 MG tablet Take 1 tablet (10 mg total) by mouth 3 (three) times daily as needed for muscle spasms. 12/28/16   Gilda CreasePollina, Christopher J, MD  fluticasone (FLONASE) 50 MCG/ACT nasal spray Place 1 spray into both nostrils daily. Patient taking differently: Place 1 spray into both  nostrils daily as needed for allergies.  11/29/16   Audry PiliMohr, Tyler, PA-C  HYDROcodone-acetaminophen (NORCO/VICODIN) 5-325 MG tablet Take 1 tablet by mouth every 4 (four) hours as needed for moderate pain. 12/28/16   Gilda CreasePollina, Christopher J, MD  ibuprofen (ADVIL,MOTRIN) 600 MG tablet Take 1 tablet (600 mg total) by mouth every 6 (six) hours as needed. 01/05/17   Derwood KaplanNanavati, Ankit, MD  methocarbamol (ROBAXIN) 500 MG tablet Take 1 tablet (500 mg total) by mouth 2 (two) times daily. 01/05/17   Derwood KaplanNanavati, Ankit, MD  oxyCODONE-acetaminophen (PERCOCET) 5-325 MG tablet Take 1 tablet by mouth every 4 (four) hours as needed. 08/15/17   Donnetta Hutchingook, Brian, MD  traMADol (ULTRAM) 50 MG tablet Take 1 tablet (50 mg total) by mouth every 8 (eight) hours as needed. 01/05/17   Derwood KaplanNanavati, Ankit, MD    Allergies Patient has no known allergies.   REVIEW OF SYSTEMS  Negative except as noted here or in the History of Present Illness.   PHYSICAL EXAMINATION  Initial Vital Signs Blood pressure 122/73, pulse 85, temperature 98 F (36.7 C), temperature source Oral, resp. rate 18, height 5\' 3"  (1.6 m), weight 77.1 kg (170 lb), last menstrual period 09/11/2017, SpO2 100 %.  Examination General: Well-developed, well-nourished female in no acute distress; appearance consistent with age of record HENT: normocephalic; atraumatic; carious, tender right upper second molar; no trismus; uvula midline; no dysphonia; no pharyngeal  edema; no stridor Eyes: pupils equal, round and reactive to light; extraocular muscles intact Neck: supple; right anterior cervical lymphadenopathy Heart: regular rate and rhythm Lungs: clear to auscultation bilaterally Abdomen: soft; nondistended present Extremities: No deformity; full range of motion Neurologic: Awake, alert and oriented; motor function intact in all extremities and symmetric; no facial droop Skin: Warm and dry Psychiatric: Flat affect   RESULTS  Summary of this visit's results, reviewed by  myself:   EKG Interpretation  Date/Time:    Ventricular Rate:    PR Interval:    QRS Duration:   QT Interval:    QTC Calculation:   R Axis:     Text Interpretation:        Laboratory Studies: No results found for this or any previous visit (from the past 24 hour(s)). Imaging Studies: No results found.  ED COURSE  Nursing notes and initial vitals signs, including pulse oximetry, reviewed.  Vitals:   09/26/17 0435 09/26/17 0436  BP: 122/73   Pulse: 85   Resp: 18   Temp: 98 F (36.7 C)   TempSrc: Oral   SpO2: 100%   Weight:  77.1 kg (170 lb)  Height:  5\' 3"  (1.6 m)    PROCEDURES    ED DIAGNOSES     ICD-10-CM   1. Pain due to dental caries K02.9        Karen Warren, Karen RuizJohn, MD 09/26/17 [phone]

## 2018-01-03 ENCOUNTER — Encounter (HOSPITAL_BASED_OUTPATIENT_CLINIC_OR_DEPARTMENT_OTHER): Payer: Self-pay | Admitting: Emergency Medicine

## 2018-01-03 ENCOUNTER — Emergency Department (HOSPITAL_BASED_OUTPATIENT_CLINIC_OR_DEPARTMENT_OTHER)
Admission: EM | Admit: 2018-01-03 | Discharge: 2018-01-03 | Disposition: A | Discharge status: Home / Self Care | Payer: Self-pay | Attending: Emergency Medicine | Admitting: Emergency Medicine

## 2018-01-03 ENCOUNTER — Other Ambulatory Visit: Payer: Self-pay

## 2018-01-03 DIAGNOSIS — F17210 Nicotine dependence, cigarettes, uncomplicated: Secondary | ICD-10-CM | POA: Insufficient documentation

## 2018-01-03 DIAGNOSIS — Z79899 Other long term (current) drug therapy: Secondary | ICD-10-CM | POA: Insufficient documentation

## 2018-01-03 DIAGNOSIS — L600 Ingrowing nail: Secondary | ICD-10-CM | POA: Insufficient documentation

## 2018-01-03 MED ORDER — BACITRACIN ZINC 500 UNIT/GM EX OINT: 1.0000 "application " | TOPICAL_OINTMENT | Freq: Two times a day (BID) | CUTANEOUS | 0 refills | Status: DC

## 2018-01-03 MED ORDER — NAPROXEN 375 MG PO TABS: 375.0000 mg | ORAL_TABLET | Freq: Two times a day (BID) | ORAL | 0 refills | Status: DC

## 2018-01-03 NOTE — Discharge Instructions (Signed)
Your symptoms seem consistent with an ingrown toenail.  I did not see any signs of infection at this time however we will give topical antibiotic ointment to apply to the area.  I would also continue warm Epsom salt soaks.  Please take the Naproxen as prescribed for pain. Do not take any additional NSAIDs including Motrin, Aleve, Ibuprofen, Advil.  These call the podiatrist next week to schedule an appointment you may need to have your toenail partially removed the symptoms not improved.  If you develop any redness or drainage from the area you may need oral antibiotics.

## 2018-01-03 NOTE — ED Provider Notes (Signed)
[facility] EMERGENCY DEPARTMENT Provider Note   CSN: [id number] Arrival date & time: 01/03/18  1044     History   Chief Complaint Chief Complaint  Patient presents with  . Toe Pain    HPI Karen Warren is a 32 y.o. female.  HPI 32 year old female with no pertinent past medical history presents to the emergency department today for evaluation of right great toe pain.  The patient states the pain is been there for the past 1-2 weeks.  States that approximately 2 and half weeks ago she got a pedicure.  She states that since then the pain has persisted.  States that they did cause her pain that day while at the pedicure place.  Patient denies any discharge or erythema around the area.  Shoes make the pain worse.  Palpation makes the pain worse.  She is not taking anything for the pain prior to arrival.  Patient still has gel nails on her nail.  Denies any associated fevers, chills, nausea or vomiting. Past Medical History:  Diagnosis Date  . Renal disorder    kidney infection    Patient Active Problem List   Diagnosis Date Noted  . Acute pyelonephritis 04/21/2014  . Sepsis (HCC) 04/21/2014  . Diarrhea 06/09/2012  . Leukocytosis 06/07/2012  . Anemia 06/07/2012  . Pyelonephritis 06/06/2012  . Abdominal pain 06/06/2012  . Pain, dental 06/06/2012  . Fever 06/06/2012  . Hypokalemia 06/06/2012    Past Surgical History:  Procedure Laterality Date  . CESAREAN SECTION    . CHOLECYSTECTOMY    . TUBAL LIGATION    . WISDOM TOOTH EXTRACTION       OB History   None      Home Medications    Prior to Admission medications   Medication Sig Start Date End Date Taking? Authorizing Provider  amoxicillin (AMOXIL) 400 MG/5ML suspension Take 6.3 mLs (500 mg total) by mouth 3 (three) times daily. 09/26/17   Molpus, John, MD  bacitracin ointment Apply 1 application topically 2 (two) times daily. 01/03/18   Rise MuLeaphart, Kenneth T, PA-C  fluticasone (FLONASE) 50 MCG/ACT nasal  spray Place 1 spray into both nostrils daily. Patient taking differently: Place 1 spray into both nostrils daily as needed for allergies.  11/29/16   Audry PiliMohr, Tyler, PA-C  HYDROcodone-acetaminophen (HYCET) 7.5-325 mg/15 ml solution Take 10 mLs by mouth every 4 (four) hours as needed for severe pain. 09/26/17 09/26/18  Molpus, Jonny RuizJohn, MD  naproxen (NAPROSYN) 375 MG tablet Take 1 tablet (375 mg total) by mouth 2 (two) times daily. 01/03/18   Rise MuLeaphart, Kenneth T, PA-C    Family History Family History  Problem Relation Age of Onset  . Hypertension Mother   . Hypertension Maternal Grandmother   . Diabetes Maternal Grandmother     Social History Social History   Tobacco Use  . Smoking status: Current Every Day Smoker    Packs/day: 0.30    Types: Cigarettes  . Smokeless tobacco: Never Used  Substance Use Topics  . Alcohol use: No  . Drug use: No     Allergies   Patient has no known allergies.   Review of Systems Review of Systems  All other systems reviewed and are negative.    Physical Exam Updated Vital Signs BP 121/78 (BP Location: Right Arm)   Pulse 78   Temp 99.2 F (37.3 C) (Oral)   Resp 20   Ht 5\' 3"  (1.6 m)   Wt 82.6 kg (182 lb 1.6 oz)  LMP 12/06/2017   SpO2 100%   BMI 32.26 kg/m   Physical Exam  Constitutional: She appears well-developed and well-nourished. No distress.  HENT:  Head: Normocephalic and atraumatic.  Eyes: Right eye exhibits no discharge. Left eye exhibits no discharge. No scleral icterus.  Neck: Normal range of motion.  Pulmonary/Chest: No respiratory distress.  Musculoskeletal: Normal range of motion.  Neurological: She is alert.  Skin: Skin is warm. Capillary refill takes less than 2 seconds. No pallor.  Patient has what appears to be an ingrown toenail of the left great toe.  There is no associated erythema, warmth or drainage.  There is no paronychia noted.  Patient was mildly tender to palpation.  Full range of motion of the left first  metatarsal without any pain.  Brisk cap refill.  DP pulses 2+ bilaterally.  Toenail still in place.  Psychiatric: Her behavior is normal. Judgment and thought content normal.  Nursing note and vitals reviewed.    ED Treatments / Results  Labs (all labs ordered are listed, but only abnormal results are displayed) Labs Reviewed - No data to display  EKG None  Radiology No results found.  Procedures Procedures (including critical care time)  Medications Ordered in ED Medications - No data to display   Initial Impression / Assessment and Plan / ED Course  I have reviewed the triage vital signs and the nursing notes.  Pertinent labs & imaging results that were available during my care of the patient were reviewed by me and considered in my medical decision making (see chart for details).     She presents to the ED with complaints of left great toe pain.  Started 1-2 weeks ago after having a pedicure.  On exam patient has no signs of infection at this time.  There is no erythema, warmth or drainage from the site.  She does have appears to be an ingrown toenail.  Discussed with patient symptomatic care at home with topical antibiotic ointment, warm soaks and loose fitting shoes.  Have given her podiatry follow-up for further evaluation and possible removal of the nail if needed.  Patient has no signs of paronychia or infection at that time that would require oral antibiotics.  She has no systemic symptoms.  There is no erythema or warmth of the joint that be concerning for septic arthritis.  Offered x-ray the patient however patient would like to avoid at this time.  Pt is hemodynamically stable, in NAD, & able to ambulate in the ED. Evaluation does not show pathology that would require ongoing emergent intervention or inpatient treatment. I explained the diagnosis to the patient. Pain has been managed & has no complaints prior to dc. Pt is comfortable with above plan and is stable for  discharge at this time. All questions were answered prior to disposition. Strict return precautions for f/u to the ED were discussed. Encouraged follow up with PCP.   Final Clinical Impressions(s) / ED Diagnoses   Final diagnoses:  Ingrown toenail of right foot    ED Discharge Orders        Ordered    bacitracin ointment  2 times daily     01/03/18 1143    naproxen (NAPROSYN) 375 MG tablet  2 times daily     01/03/18 1144       Wallace Keller 01/03/18 1149    Cathren Laine, MD 01/03/18 1359

## 2018-01-03 NOTE — ED Triage Notes (Signed)
L great toe pain after getting a pedicure 1 week ago.

## 2018-02-12 ENCOUNTER — Emergency Department (HOSPITAL_BASED_OUTPATIENT_CLINIC_OR_DEPARTMENT_OTHER): Payer: Self-pay

## 2018-02-12 ENCOUNTER — Encounter (HOSPITAL_BASED_OUTPATIENT_CLINIC_OR_DEPARTMENT_OTHER): Payer: Self-pay | Admitting: *Deleted

## 2018-02-12 ENCOUNTER — Emergency Department (HOSPITAL_BASED_OUTPATIENT_CLINIC_OR_DEPARTMENT_OTHER)
Admission: EM | Admit: 2018-02-12 | Discharge: 2018-02-12 | Disposition: A | Discharge status: Home / Self Care | Payer: Self-pay | Attending: Emergency Medicine | Admitting: Emergency Medicine

## 2018-02-12 ENCOUNTER — Other Ambulatory Visit: Payer: Self-pay

## 2018-02-12 DIAGNOSIS — Z79899 Other long term (current) drug therapy: Secondary | ICD-10-CM | POA: Insufficient documentation

## 2018-02-12 DIAGNOSIS — F17210 Nicotine dependence, cigarettes, uncomplicated: Secondary | ICD-10-CM | POA: Insufficient documentation

## 2018-02-12 DIAGNOSIS — N946 Dysmenorrhea, unspecified: Secondary | ICD-10-CM | POA: Insufficient documentation

## 2018-02-12 LAB — URINALYSIS, ROUTINE W REFLEX MICROSCOPIC
BILIRUBIN URINE: NEGATIVE
Glucose, UA: NEGATIVE mg/dL
Ketones, ur: NEGATIVE mg/dL
Leukocytes, UA: NEGATIVE
NITRITE: NEGATIVE
Protein, ur: NEGATIVE mg/dL
Specific Gravity, Urine: >1.03 — ABNORMAL HIGH (ref 1.005–1.030)
pH: 6 (ref 5.0–8.0)

## 2018-02-12 LAB — BASIC METABOLIC PANEL
Anion gap: 8 (ref 5–15)
BUN: 16 mg/dL (ref 6–20)
CHLORIDE: 111 mmol/L (ref 101–111)
CO2: 20 mmol/L — AB (ref 22–32)
Calcium: 8.2 mg/dL — ABNORMAL LOW (ref 8.9–10.3)
Creatinine, Ser: 0.74 mg/dL (ref 0.44–1.00)
GFR calc Af Amer: >60 mL/min (ref >60)
GFR calc non Af Amer: >60 mL/min (ref >60)
GLUCOSE: 90 mg/dL (ref 65–99)
POTASSIUM: 3.7 mmol/L (ref 3.5–5.1)
Sodium: 139 mmol/L (ref 135–145)

## 2018-02-12 LAB — WET PREP, GENITAL
Sperm: NONE SEEN
TRICH WET PREP: NONE SEEN
YEAST WET PREP: NONE SEEN

## 2018-02-12 LAB — CBC
HEMATOCRIT: 31.5 % — AB (ref 36.0–46.0)
Hemoglobin: 11 g/dL — ABNORMAL LOW (ref 12.0–15.0)
MCH: 29.3 pg (ref 26.0–34.0)
MCHC: 34.9 g/dL (ref 30.0–36.0)
MCV: 84 fL (ref 78.0–100.0)
Platelets: 274 10*3/uL (ref 150–400)
RBC: 3.75 MIL/uL — ABNORMAL LOW (ref 3.87–5.11)
RDW: 14.1 % (ref 11.5–15.5)
WBC: 6.3 10*3/uL (ref 4.0–10.5)

## 2018-02-12 LAB — URINALYSIS, MICROSCOPIC (REFLEX)

## 2018-02-12 LAB — PREGNANCY, URINE: PREG TEST UR: NEGATIVE

## 2018-02-12 MED ORDER — SODIUM CHLORIDE 0.9 % IV BOLUS
1000.0000 mL | Freq: Once | INTRAVENOUS | Status: AC
  Administered 2018-02-12: 1000 mL via INTRAVENOUS

## 2018-02-12 MED ORDER — ONDANSETRON HCL 4 MG/2ML IJ SOLN
4.0000 mg | Freq: Once | INTRAMUSCULAR | Status: AC
  Administered 2018-02-12: 4 mg via INTRAVENOUS
  Filled 2018-02-12: qty 2

## 2018-02-12 MED ORDER — SODIUM CHLORIDE 0.9 % IV SOLN: INTRAVENOUS | Status: DC

## 2018-02-12 MED ORDER — HYDROMORPHONE HCL 1 MG/ML IJ SOLN
1.0000 mg | Freq: Once | INTRAMUSCULAR | Status: AC
  Administered 2018-02-12: 1 mg via INTRAVENOUS
  Filled 2018-02-12: qty 1

## 2018-02-12 MED ORDER — HYDROCODONE-ACETAMINOPHEN 5-325 MG PO TABS
1.0000 | ORAL_TABLET | Freq: Four times a day (QID) | ORAL | 0 refills | Status: DC | PRN
Start: 2018-02-12 — End: 2019-12-24

## 2018-02-12 NOTE — ED Notes (Signed)
Patient asleep upon entering the room, patient woken up to obtain vitals, when vitals were done patient started complaining about her abdominal pain.

## 2018-02-12 NOTE — ED Notes (Signed)
ED Provider at bedside. 

## 2018-02-12 NOTE — ED Triage Notes (Signed)
Abdominal pain since yesterday. States she is having menstrual cramps.

## 2018-02-12 NOTE — Discharge Instructions (Signed)
Take the pain medicine as directed.  Work note provided.  Recommend follow-up with women's outpatient walk-in acute care clinic.  Information provided.  Return for any new or worse symptoms.

## 2018-02-12 NOTE — ED Notes (Signed)
Pt reports a history of painful menstrual cramps. Pt states pain today is similar to previous episodes. Pt reports that she has been non-compliant with following up with OB/Gyn.

## 2018-02-12 NOTE — ED Provider Notes (Addendum)
[facility] EMERGENCY DEPARTMENT Provider Note   CSN: [id number] Arrival date & time: 02/12/18  1732     History   Chief Complaint Chief Complaint  Patient presents with  . Abdominal Pain    HPI Karen Warren is a 32 y.o. female.  Patient with complaint of painful menstrual cramps.  Lots of abdominal pain.  Patient does not have OB/GYN available for follow-up.  Symptoms started yesterday.  This is occurred with the onset of her menstrual periods was also started yesterday.  The patient has more increased severe pain on the right side.  Bleeding on the heavy side but normally her bleeding duration is just 5 days which is normal for her.  Has not had prolonged bleeding in the past she had been treated with birth control pills to help with this.  But not on any currently.  No fever no nausea or vomiting.  States that the pain is 10 out of 10 predominantly in the right adnexal area.     Past Medical History:  Diagnosis Date  . Renal disorder    kidney infection    Patient Active Problem List   Diagnosis Date Noted  . Acute pyelonephritis 04/21/2014  . Sepsis (HCC) 04/21/2014  . Diarrhea 06/09/2012  . Leukocytosis 06/07/2012  . Anemia 06/07/2012  . Pyelonephritis 06/06/2012  . Abdominal pain 06/06/2012  . Pain, dental 06/06/2012  . Fever 06/06/2012  . Hypokalemia 06/06/2012    Past Surgical History:  Procedure Laterality Date  . CESAREAN SECTION    . CHOLECYSTECTOMY    . TUBAL LIGATION    . WISDOM TOOTH EXTRACTION       OB History   None      Home Medications    Prior to Admission medications   Medication Sig Start Date End Date Taking? Authorizing Provider  amoxicillin (AMOXIL) 400 MG/5ML suspension Take 6.3 mLs (500 mg total) by mouth 3 (three) times daily. 09/26/17   Molpus, John, MD  bacitracin ointment Apply 1 application topically 2 (two) times daily. 01/03/18   Rise Mu, PA-C  fluticasone (FLONASE) 50 MCG/ACT nasal spray Place 1  spray into both nostrils daily. Patient taking differently: Place 1 spray into both nostrils daily as needed for allergies.  11/29/16   Audry Pili, PA-C  HYDROcodone-acetaminophen (HYCET) 7.5-325 mg/15 ml solution Take 10 mLs by mouth every 4 (four) hours as needed for severe pain. 09/26/17 09/26/18  Molpus, Jonny Ruiz, MD  naproxen (NAPROSYN) 375 MG tablet Take 1 tablet (375 mg total) by mouth 2 (two) times daily. 01/03/18   Rise Mu, PA-C    Family History Family History  Problem Relation Age of Onset  . Hypertension Mother   . Hypertension Maternal Grandmother   . Diabetes Maternal Grandmother     Social History Social History   Tobacco Use  . Smoking status: Current Every Day Smoker    Packs/day: 0.30    Types: Cigarettes  . Smokeless tobacco: Never Used  Substance Use Topics  . Alcohol use: No  . Drug use: No     Allergies   Patient has no known allergies.   Review of Systems Review of Systems  Constitutional: Negative for fever.  HENT: Negative for congestion.   Eyes: Negative for redness.  Respiratory: Negative for shortness of breath.   Cardiovascular: Negative for chest pain and leg swelling.  Gastrointestinal: Negative for abdominal pain.  Genitourinary: Positive for pelvic pain and vaginal bleeding. Negative for dysuria and vaginal discharge.  Musculoskeletal:  Positive for back pain.  Skin: Negative for rash.  Neurological: Negative for syncope.  Hematological: Does not bruise/bleed easily.  Psychiatric/Behavioral: Negative for confusion.     Physical Exam Updated Vital Signs BP 114/66   Pulse 66   Temp 98.2 F (36.8 C) (Oral)   Resp 18   Ht 1.6 m ( )   Wt 81.9 kg (180 lb 8.9 oz)   LMP 02/11/2018   SpO2 97%   BMI 31.98 kg/m   Physical Exam  Constitutional: She is oriented to person, place, and time. She appears well-developed and well-nourished. No distress.  HENT:  Head: Normocephalic and atraumatic.  Mouth/Throat: Oropharynx is  clear and moist.  Eyes: Pupils are equal, round, and reactive to light. Conjunctivae and EOM are normal.  Neck: Neck supple.  Cardiovascular: Normal rate, regular rhythm and normal heart sounds.  Pulmonary/Chest: Effort normal and breath sounds normal.  Abdominal: Soft. Bowel sounds are normal. There is no tenderness. There is no guarding.  Subjective right lower quadrant pain but no tenderness.  No guarding.  Genitourinary: Uterus normal. No vaginal discharge found.  Genitourinary Comments: Vaginal bleeding no clots in the vault.  No cervical motion tenderness no uterine tenderness.  No adnexal tenderness.  And external genitalia normal.  Musculoskeletal: Normal range of motion. She exhibits no edema.  Neurological: She is alert and oriented to person, place, and time. No cranial nerve deficit or sensory deficit. She exhibits normal muscle tone. Coordination normal.  Skin: Skin is warm. No rash noted.  Nursing note and vitals reviewed.    ED Treatments / Results  Labs (all labs ordered are listed, but only abnormal results are displayed) Labs Reviewed  WET PREP, GENITAL - Abnormal; Notable for the following components:      Result Value   Clue Cells Wet Prep HPF POC PRESENT (*)    WBC, Wet Prep HPF POC FEW (*)    All other components within normal limits  URINALYSIS, ROUTINE W REFLEX MICROSCOPIC - Abnormal; Notable for the following components:   APPearance CLOUDY (*)    Specific Gravity, Urine >1.030 (*)    Hgb urine dipstick LARGE (*)    All other components within normal limits  URINALYSIS, MICROSCOPIC (REFLEX) - Abnormal; Notable for the following components:   Bacteria, UA FEW (*)    All other components within normal limits  BASIC METABOLIC PANEL - Abnormal; Notable for the following components:   CO2 20 (*)    Calcium 8.2 (*)    All other components within normal limits  CBC - Abnormal; Notable for the following components:   RBC 3.75 (*)    Hemoglobin 11.0 (*)    HCT  31.5 (*)    All other components within normal limits  PREGNANCY, URINE  RPR  HIV ANTIBODY (ROUTINE TESTING)  GC/CHLAMYDIA PROBE AMP ([place]) NOT AT [facility]    EKG None  Radiology US Transvaginal Non-ob  Result Date: 02/12/2018 CLINICAL DATA:  Right lower quadrant pain EXAM: TRANSABDOMINAL AND TRANSVAGINAL ULTRASOUND OF PELVIS DOPPLER ULTRASOUND OF OVARIES TECHNIQUE: Both transabdominal and transvaginal ultrasound examinations of the pelvis were performed. Transabdominal technique was performed for global imaging of the pelvis including uterus, ovaries, adnexal regions, and pelvic cul-de-sac. It was necessary to proceed with endovaginal exam following the transabdominal exam to visualize the uterus endometrium and ovaries. Color and duplex Doppler ultrasound was utilized to evaluate blood flow to the ovaries. COMPARISON:  Pelvic CT 01/05/2017 FINDINGS: Uterus Measurements: 11 x 4.3 x 5.6 cm. No  fibroids or other mass visualized. Endometrium Thickness: 8 mm.  No focal abnormality visualized. Right ovary Measurements: 2.1 x 2.5 x 3 cm. Small hyperechoic foci measuring up to 6 mm, possible small hemorrhagic follicles. Left ovary Measurements: 3.6 x 2.7 x 2.4 cm. Small isoechoic 8 mm area, possible hemorrhagic follicle. Pulsed Doppler evaluation of both ovaries demonstrates normal low-resistance arterial and venous waveforms. Other findings No abnormal free fluid. IMPRESSION: No sonographic evidence for ovarian torsion or suspicious ovarian mass. Electronically Signed   By: Jasmine Pang M.D.   On: 02/12/2018 22:25   US Pelvis Complete  Result Date: 02/12/2018 CLINICAL DATA:  Right lower quadrant pain EXAM: TRANSABDOMINAL AND TRANSVAGINAL ULTRASOUND OF PELVIS DOPPLER ULTRASOUND OF OVARIES TECHNIQUE: Both transabdominal and transvaginal ultrasound examinations of the pelvis were performed. Transabdominal technique was performed for global imaging of the pelvis including uterus, ovaries, adnexal regions,  and pelvic cul-de-sac. It was necessary to proceed with endovaginal exam following the transabdominal exam to visualize the uterus endometrium and ovaries. Color and duplex Doppler ultrasound was utilized to evaluate blood flow to the ovaries. COMPARISON:  Pelvic CT 01/05/2017 FINDINGS: Uterus Measurements: 11 x 4.3 x 5.6 cm. No fibroids or other mass visualized. Endometrium Thickness: 8 mm.  No focal abnormality visualized. Right ovary Measurements: 2.1 x 2.5 x 3 cm. Small hyperechoic foci measuring up to 6 mm, possible small hemorrhagic follicles. Left ovary Measurements: 3.6 x 2.7 x 2.4 cm. Small isoechoic 8 mm area, possible hemorrhagic follicle. Pulsed Doppler evaluation of both ovaries demonstrates normal low-resistance arterial and venous waveforms. Other findings No abnormal free fluid. IMPRESSION: No sonographic evidence for ovarian torsion or suspicious ovarian mass. Electronically Signed   By: Jasmine Pang M.D.   On: 02/12/2018 22:25   [place] Art/ven Flow Abd Pelv Doppler  Result Date: 02/12/2018 CLINICAL DATA:  Right lower quadrant pain EXAM: TRANSABDOMINAL AND TRANSVAGINAL ULTRASOUND OF PELVIS DOPPLER ULTRASOUND OF OVARIES TECHNIQUE: Both transabdominal and transvaginal ultrasound examinations of the pelvis were performed. Transabdominal technique was performed for global imaging of the pelvis including uterus, ovaries, adnexal regions, and pelvic cul-de-sac. It was necessary to proceed with endovaginal exam following the transabdominal exam to visualize the uterus endometrium and ovaries. Color and duplex Doppler ultrasound was utilized to evaluate blood flow to the ovaries. COMPARISON:  Pelvic CT 01/05/2017 FINDINGS: Uterus Measurements: 11 x 4.3 x 5.6 cm. No fibroids or other mass visualized. Endometrium Thickness: 8 mm.  No focal abnormality visualized. Right ovary Measurements: 2.1 x 2.5 x 3 cm. Small hyperechoic foci measuring up to 6 mm, possible small hemorrhagic follicles. Left ovary  Measurements: 3.6 x 2.7 x 2.4 cm. Small isoechoic 8 mm area, possible hemorrhagic follicle. Pulsed Doppler evaluation of both ovaries demonstrates normal low-resistance arterial and venous waveforms. Other findings No abnormal free fluid. IMPRESSION: No sonographic evidence for ovarian torsion or suspicious ovarian mass. Electronically Signed   By: Jasmine Pang M.D.   On: 02/12/2018 22:25    Procedures Procedures (including critical care time)  Medications Ordered in ED Medications  0.9 %  sodium chloride infusion (has no administration in time range)  sodium chloride 0.9 % bolus 1,000 mL (0 mLs Intravenous Stopped 02/12/18 2147)  ondansetron (ZOFRAN) injection 4 mg (4 mg Intravenous Given 02/12/18 2116)  HYDROmorphone (DILAUDID) injection 1 mg (1 mg Intravenous Given 02/12/18 2116)     Initial Impression / Assessment and Plan / ED Course  I have reviewed the triage vital signs and the nursing notes.  Pertinent labs & imaging results  that were available during my care of the patient were reviewed by me and considered in my medical decision making (see chart for details).       Ultrasound was done to rule out torsion.  And to evaluate the pelvic organs.  No specific findings.  No evidence of any pelvic inflammatory disease.  Patient does have a few clue cells.  But do not feel that this is consistent with a significant bacterial vaginosis clinically.   Pregnancy test negative.  Urinalysis not consistent with urinary tract infection.  Patient currently does not have OB/GYN she lives very close to [facility].  Recommended follow-up with the women's acute care clinic initially.  Patient will do that.  Patient will return for any new or worse symptoms.  Pelvic cultures are pending and patient is aware of that.  Final Clinical Impressions(s) / ED Diagnoses   Final diagnoses:  None    ED Discharge Orders    None       Vanetta Mulders, MD 02/12/18 1610    Vanetta Mulders,  MD 02/12/18 [phone]

## 2018-02-12 NOTE — ED Notes (Signed)
Pt teaching provided on medications that may cause drowsiness. Pt instructed not to drive or operate heavy machinery while taking the prescribed medication. Pt verbalized understanding.   

## 2018-02-14 LAB — HIV ANTIBODY (ROUTINE TESTING W REFLEX): HIV SCREEN 4TH GENERATION: NONREACTIVE

## 2018-02-14 LAB — RPR: RPR: NONREACTIVE

## 2018-02-15 LAB — GC/CHLAMYDIA PROBE AMP (~~LOC~~) NOT AT ARMC
CHLAMYDIA, DNA PROBE: NEGATIVE
Neisseria Gonorrhea: NEGATIVE

## 2018-04-24 ENCOUNTER — Other Ambulatory Visit: Payer: Self-pay

## 2018-04-24 ENCOUNTER — Encounter (HOSPITAL_BASED_OUTPATIENT_CLINIC_OR_DEPARTMENT_OTHER): Payer: Self-pay | Admitting: *Deleted

## 2018-04-24 ENCOUNTER — Emergency Department (HOSPITAL_BASED_OUTPATIENT_CLINIC_OR_DEPARTMENT_OTHER)
Admission: EM | Admit: 2018-04-24 | Discharge: 2018-04-25 | Disposition: A | Discharge status: Home / Self Care | Payer: Self-pay | Attending: Emergency Medicine | Admitting: Emergency Medicine

## 2018-04-24 DIAGNOSIS — F17210 Nicotine dependence, cigarettes, uncomplicated: Secondary | ICD-10-CM | POA: Insufficient documentation

## 2018-04-24 DIAGNOSIS — R51 Headache: Secondary | ICD-10-CM | POA: Insufficient documentation

## 2018-04-24 DIAGNOSIS — R519 Headache, unspecified: Secondary | ICD-10-CM

## 2018-04-24 DIAGNOSIS — Z9049 Acquired absence of other specified parts of digestive tract: Secondary | ICD-10-CM | POA: Insufficient documentation

## 2018-04-24 DIAGNOSIS — Z79899 Other long term (current) drug therapy: Secondary | ICD-10-CM | POA: Insufficient documentation

## 2018-04-24 MED ORDER — KETOROLAC TROMETHAMINE 30 MG/ML IJ SOLN
30.0000 mg | Freq: Once | INTRAMUSCULAR | Status: AC
  Administered 2018-04-24: 30 mg via INTRAVENOUS
  Filled 2018-04-24: qty 1

## 2018-04-24 MED ORDER — PROCHLORPERAZINE EDISYLATE 10 MG/2ML IJ SOLN
10.0000 mg | Freq: Once | INTRAMUSCULAR | Status: AC
  Administered 2018-04-24: 10 mg via INTRAVENOUS
  Filled 2018-04-24: qty 2

## 2018-04-24 MED ORDER — SODIUM CHLORIDE 0.9 % IV BOLUS
1000.0000 mL | Freq: Once | INTRAVENOUS | Status: AC
  Administered 2018-04-24: 1000 mL via INTRAVENOUS

## 2018-04-24 MED ORDER — ACETAMINOPHEN 500 MG PO TABS
1000.0000 mg | ORAL_TABLET | Freq: Once | ORAL | Status: AC
  Administered 2018-04-24: 1000 mg via ORAL
  Filled 2018-04-24: qty 2

## 2018-04-24 MED ORDER — SODIUM CHLORIDE 0.9 % IV BOLUS: 1000.0000 mL | Freq: Once | INTRAVENOUS | Status: DC

## 2018-04-24 NOTE — ED Triage Notes (Signed)
Pt reports headache x 4 days. Taking OTC meds with minimal relief. Denies vomiting. States noise and light make it worse

## 2018-04-25 NOTE — ED Provider Notes (Signed)
[facility] EMERGENCY DEPARTMENT Provider Note   CSN: [id number] Arrival date & time: 04/24/18  2145     History   Chief Complaint Chief Complaint  Patient presents with  . Headache    HPI Karen Warren is a 32 y.o. female.  HPI Patient is a 32 year old female presents the emergency department with frontal headache over the past 4 days and taking over-the-counter meds without significant relief.  She states she has a history of headaches.  This feels similar.  Is worse with light and loud noise.  Denies nausea or vomiting.  No fevers or chills.  No neck pain or neck stiffness.  No injury or trauma to her head.  Denies chest pain abdominal pain.  No upper back pain.  No new weakness of her arms or legs.  No recent weight changes.   Past Medical History:  Diagnosis Date  . Renal disorder    kidney infection    Patient Active Problem List   Diagnosis Date Noted  . Acute pyelonephritis 04/21/2014  . Sepsis (HCC) 04/21/2014  . Diarrhea 06/09/2012  . Leukocytosis 06/07/2012  . Anemia 06/07/2012  . Pyelonephritis 06/06/2012  . Abdominal pain 06/06/2012  . Pain, dental 06/06/2012  . Fever 06/06/2012  . Hypokalemia 06/06/2012    Past Surgical History:  Procedure Laterality Date  . CESAREAN SECTION    . CHOLECYSTECTOMY    . TUBAL LIGATION    . WISDOM TOOTH EXTRACTION       OB History   None      Home Medications    Prior to Admission medications   Medication Sig Start Date End Date Taking? Authorizing Provider  amoxicillin (AMOXIL) 400 MG/5ML suspension Take 6.3 mLs (500 mg total) by mouth 3 (three) times daily. 09/26/17   Molpus, John, MD  bacitracin ointment Apply 1 application topically 2 (two) times daily. 01/03/18   Rise Mu, PA-C  fluticasone (FLONASE) 50 MCG/ACT nasal spray Place 1 spray into both nostrils daily. Patient taking differently: Place 1 spray into both nostrils daily as needed for allergies.  11/29/16   Audry Pili, PA-C    HYDROcodone-acetaminophen (HYCET) 7.5-325 mg/15 ml solution Take 10 mLs by mouth every 4 (four) hours as needed for severe pain. 09/26/17 09/26/18  Molpus, Jonny Ruiz, MD  HYDROcodone-acetaminophen (NORCO/VICODIN) 5-325 MG tablet Take 1-2 tablets by mouth every 6 (six) hours as needed for moderate pain. 02/12/18   Vanetta Mulders, MD  naproxen (NAPROSYN) 375 MG tablet Take 1 tablet (375 mg total) by mouth 2 (two) times daily. 01/03/18   Rise Mu, PA-C    Family History Family History  Problem Relation Age of Onset  . Hypertension Mother   . Hypertension Maternal Grandmother   . Diabetes Maternal Grandmother     Social History Social History   Tobacco Use  . Smoking status: Current Every Day Smoker    Packs/day: 0.30    Types: Cigarettes  . Smokeless tobacco: Never Used  Substance Use Topics  . Alcohol use: No  . Drug use: No     Allergies   Patient has no known allergies.   Review of Systems Review of Systems  All other systems reviewed and are negative.    Physical Exam Updated Vital Signs BP 118/81 (BP Location: Left Arm)   Pulse 79   Temp 98.6 F (37 C) (Oral)   Resp 18   Ht 5\' 4"  (1.626 m)   Wt 81.6 kg (180 lb)   LMP 04/16/2018  SpO2 99%   BMI 30.90 kg/m   Physical Exam  Constitutional: She is oriented to person, place, and time. She appears well-developed and well-nourished. No distress.  HENT:  Head: Normocephalic and atraumatic.  Eyes: Pupils are equal, round, and reactive to light. EOM are normal.  Neck: Normal range of motion.  Cardiovascular: Normal rate, regular rhythm and normal heart sounds.  Pulmonary/Chest: Effort normal and breath sounds normal.  Abdominal: Soft. She exhibits no distension. There is no tenderness.  Musculoskeletal: Normal range of motion.  Neurological: She is alert and oriented to person, place, and time.  5/5 strength in major muscle groups of  bilateral upper and lower extremities. Speech normal. No facial  asymetry.   Skin: Skin is warm and dry.  Psychiatric: She has a normal mood and affect. Judgment normal.  Nursing note and vitals reviewed.    ED Treatments / Results  Labs (all labs ordered are listed, but only abnormal results are displayed) Labs Reviewed - No data to display  EKG None  Radiology No results found.  Procedures Procedures (including critical care time)  Medications Ordered in ED Medications  ketorolac (TORADOL) 30 MG/ML injection 30 mg (30 mg Intravenous Given 04/24/18 2350)  prochlorperazine (COMPAZINE) injection 10 mg (10 mg Intravenous Given 04/24/18 2350)  acetaminophen (TYLENOL) tablet 1,000 mg (1,000 mg Oral Given 04/24/18 2350)  sodium chloride 0.9 % bolus 1,000 mL (0 mLs Intravenous Stopped 04/25/18 0037)     Initial Impression / Assessment and Plan / ED Course  I have reviewed the triage vital signs and the nursing notes.  Pertinent labs & imaging results that were available during my care of the patient were reviewed by me and considered in my medical decision making (see chart for details).     Suspect typical migraine.  Patient will be initiated on standard treatment here.  Normal neurologic exam.  Doubt stroke.  I was informed by the nurse that the patient has removed her own IV and was no longer in the room.  Nurse reported that she saw her walking on the emergency department.  I was unable to further evaluate the patient's response to initial treatment.  Final Clinical Impressions(s) / ED Diagnoses   Final diagnoses:  None    ED Discharge Orders    None       Azalia Bilisampos, Rhian Funari, MD 04/25/18 [id number]

## 2018-04-25 NOTE — ED Notes (Signed)
When round on pt, pt was gone after given medication for HA. Pt removed her own IV with fluids running only half of IV fluids gotten. Dr. Patria Maneampos notified.

## 2018-04-25 NOTE — ED Notes (Signed)
Witnessed pt walk out ED entrance. She did not inform staff where she was going or if she'd return

## 2018-09-13 ENCOUNTER — Emergency Department (HOSPITAL_BASED_OUTPATIENT_CLINIC_OR_DEPARTMENT_OTHER)
Admission: EM | Admit: 2018-09-13 | Discharge: 2018-09-13 | Disposition: A | Discharge status: Home / Self Care | Payer: Self-pay | Attending: Emergency Medicine | Admitting: Emergency Medicine

## 2018-09-13 ENCOUNTER — Other Ambulatory Visit: Payer: Self-pay

## 2018-09-13 ENCOUNTER — Encounter (HOSPITAL_BASED_OUTPATIENT_CLINIC_OR_DEPARTMENT_OTHER): Payer: Self-pay | Admitting: Emergency Medicine

## 2018-09-13 DIAGNOSIS — J029 Acute pharyngitis, unspecified: Secondary | ICD-10-CM | POA: Insufficient documentation

## 2018-09-13 DIAGNOSIS — Z79899 Other long term (current) drug therapy: Secondary | ICD-10-CM | POA: Insufficient documentation

## 2018-09-13 DIAGNOSIS — Z9049 Acquired absence of other specified parts of digestive tract: Secondary | ICD-10-CM | POA: Insufficient documentation

## 2018-09-13 DIAGNOSIS — F17210 Nicotine dependence, cigarettes, uncomplicated: Secondary | ICD-10-CM | POA: Insufficient documentation

## 2018-09-13 LAB — GROUP A STREP BY PCR: GROUP A STREP BY PCR: NOT DETECTED

## 2018-09-13 MED ORDER — DEXAMETHASONE 6 MG PO TABS
10.0000 mg | ORAL_TABLET | Freq: Once | ORAL | Status: AC
  Administered 2018-09-13: 10 mg via ORAL
  Filled 2018-09-13: qty 1

## 2018-09-13 MED ORDER — ACETAMINOPHEN 325 MG PO TABS
650.0000 mg | ORAL_TABLET | Freq: Once | ORAL | Status: AC
  Administered 2018-09-13: 650 mg via ORAL
  Filled 2018-09-13: qty 2

## 2018-09-13 NOTE — ED Triage Notes (Signed)
Pt states she has a sore throat that started two days ago. Now has gotten worse. Now c/o left ear and face pain. Also states she is dizzy. No fevers, nausea, or vomiting. Pt endorses chills

## 2018-09-13 NOTE — ED Provider Notes (Signed)
[facility] EMERGENCY DEPARTMENT Provider Note   CSN: [id number] Arrival date & time: 09/13/18  [id number]     History   Chief Complaint Chief Complaint  Patient presents with  . Sore Throat  . Otalgia    HPI Karen Warren is a 32 y.o. female.  The history is provided by the patient.  Sore Throat  This is a new problem. The current episode started yesterday. The problem occurs constantly. The problem has not changed since onset.Pertinent negatives include no chest pain, no abdominal pain, no headaches and no shortness of breath. Nothing aggravates the symptoms. Nothing relieves the symptoms. She has tried nothing for the symptoms. The treatment provided no relief.  Otalgia  Pertinent negatives include no ear discharge, no headaches, no hearing loss, no rhinorrhea, no sore throat, no abdominal pain, no vomiting, no cough and no rash.    Past Medical History:  Diagnosis Date  . Renal disorder    kidney infection    Patient Active Problem List   Diagnosis Date Noted  . Acute pyelonephritis 04/21/2014  . Sepsis (HCC) 04/21/2014  . Diarrhea 06/09/2012  . Leukocytosis 06/07/2012  . Anemia 06/07/2012  . Pyelonephritis 06/06/2012  . Abdominal pain 06/06/2012  . Pain, dental 06/06/2012  . Fever 06/06/2012  . Hypokalemia 06/06/2012    Past Surgical History:  Procedure Laterality Date  . CESAREAN SECTION    . CHOLECYSTECTOMY    . TUBAL LIGATION    . WISDOM TOOTH EXTRACTION       OB History   None      Home Medications    Prior to Admission medications   Medication Sig Start Date End Date Taking? Authorizing Provider  amoxicillin (AMOXIL) 400 MG/5ML suspension Take 6.3 mLs (500 mg total) by mouth 3 (three) times daily. 09/26/17   Molpus, John, MD  bacitracin ointment Apply 1 application topically 2 (two) times daily. 01/03/18   Rise MuLeaphart, Kenneth T, PA-C  fluticasone (FLONASE) 50 MCG/ACT nasal spray Place 1 spray into both nostrils daily. Patient taking  differently: Place 1 spray into both nostrils daily as needed for allergies.  11/29/16   Audry PiliMohr, Tyler, PA-C  HYDROcodone-acetaminophen (HYCET) 7.5-325 mg/15 ml solution Take 10 mLs by mouth every 4 (four) hours as needed for severe pain. 09/26/17 09/26/18  Molpus, Jonny RuizJohn, MD  HYDROcodone-acetaminophen (NORCO/VICODIN) 5-325 MG tablet Take 1-2 tablets by mouth every 6 (six) hours as needed for moderate pain. 02/12/18   Vanetta MuldersZackowski, Scott, MD  naproxen (NAPROSYN) 375 MG tablet Take 1 tablet (375 mg total) by mouth 2 (two) times daily. 01/03/18   Rise MuLeaphart, Kenneth T, PA-C    Family History Family History  Problem Relation Age of Onset  . Hypertension Mother   . Hypertension Maternal Grandmother   . Diabetes Maternal Grandmother     Social History Social History   Tobacco Use  . Smoking status: Current Every Day Smoker    Packs/day: 0.30    Types: Cigarettes  . Smokeless tobacco: Never Used  Substance Use Topics  . Alcohol use: No  . Drug use: No     Allergies   Patient has no known allergies.   Review of Systems Review of Systems  Constitutional: Negative for chills and fever.  HENT: Positive for congestion and ear pain. Negative for dental problem, drooling, ear discharge, facial swelling, hearing loss, mouth sores, nosebleeds, postnasal drip, rhinorrhea, sinus pain, sneezing and sore throat.        Otalgia  Eyes: Negative for pain and visual disturbance.  Respiratory: Negative for cough and shortness of breath.   Cardiovascular: Negative for chest pain and palpitations.  Gastrointestinal: Negative for abdominal pain and vomiting.  Genitourinary: Negative for dysuria and hematuria.  Musculoskeletal: Negative for arthralgias and back pain.  Skin: Negative for color change and rash.  Neurological: Negative for seizures, syncope and headaches.  All other systems reviewed and are negative.    Physical Exam Updated Vital Signs  ED Triage Vitals  Enc Vitals Group     BP 09/13/18  0637 123/65     Pulse Rate 09/13/18 0637 84     Resp 09/13/18 0637 20     Temp 09/13/18 0637 98 F (36.7 C)     Temp Source 09/13/18 0637 Oral     SpO2 09/13/18 0637 100 %     Weight 09/13/18 0634 180 lb (81.6 kg)     Height 09/13/18 0634 5\' 4"  (1.626 m)     Head Circumference --      Peak Flow --      Pain Score 09/13/18 0634 8     Pain Loc --      Pain Edu? --      Excl. in GC? --     Physical Exam  Constitutional: She appears well-developed and well-nourished. No distress.  HENT:  Head: Normocephalic and atraumatic.  Right Ear: Tympanic membrane and ear canal normal. No drainage, swelling or tenderness. No middle ear effusion.  Left Ear: Tympanic membrane and ear canal normal. No drainage, swelling or tenderness.  No middle ear effusion.  Mouth/Throat: Mucous membranes are normal. Posterior oropharyngeal erythema present. No oropharyngeal exudate, posterior oropharyngeal edema or tonsillar abscesses. No tonsillar exudate.  Eyes: Pupils are equal, round, and reactive to light. Conjunctivae and EOM are normal.  Neck: Normal range of motion. Neck supple.  Cardiovascular: Normal rate and regular rhythm.  No murmur heard. Pulmonary/Chest: Effort normal and breath sounds normal. No respiratory distress. She has no wheezes.  Abdominal: Soft. There is no tenderness.  Musculoskeletal: She exhibits no edema.  Neurological: She is alert.  Skin: Skin is warm and dry. Capillary refill takes less than 2 seconds.  Psychiatric: She has a normal mood and affect.  Nursing note and vitals reviewed.    ED Treatments / Results  Labs (all labs ordered are listed, but only abnormal results are displayed) Labs Reviewed  GROUP A STREP BY PCR    EKG None  Radiology No results found.  Procedures Procedures (including critical care time)  Medications Ordered in ED Medications  dexamethasone (DECADRON) tablet 10 mg (10 mg Oral Given 09/13/18 0724)  acetaminophen (TYLENOL) tablet 650 mg  (650 mg Oral Given 09/13/18 0724)     Initial Impression / Assessment and Plan / ED Course  I have reviewed the triage vital signs and the nursing notes.  Pertinent labs & imaging results that were available during my care of the patient were reviewed by me and considered in my medical decision making (see chart for details).     Karen Warren is a 32 year old female with no significant medical history who presents to the ED with sore throat, ear pain.  Patient with normal vitals.  No fever.  Patient symptoms the last 2 days.  Patient with signs of throat infection on exam.  However no signs of abscess.  No concern for peritonsillar abscess or retropharyngeal abscess.  No trismus.  No drooling.  Ear exam is unremarkable.  Clear breath sounds.  Doubt pneumonia.  Patient with negative  strep test.  Given Decadron, Tylenol for likely viral pharyngitis.  Recommend continued use of Tylenol Motrin for pain.  Given return precautions and discharged from ED in good condition.  This chart was dictated using voice recognition software.  Despite best efforts to proofread,  errors can occur which can change the documentation meaning.   Final Clinical Impressions(s) / ED Diagnoses   Final diagnoses:  Viral pharyngitis    ED Discharge Orders    None       Virgina Norfolk, DO 09/13/18 0802

## 2018-09-13 NOTE — Discharge Instructions (Addendum)
Strep test was negative today.  Likely have a viral process.  Continue Tylenol, Motrin for pain relief.

## 2018-10-31 ENCOUNTER — Encounter (HOSPITAL_BASED_OUTPATIENT_CLINIC_OR_DEPARTMENT_OTHER): Payer: Self-pay | Admitting: Emergency Medicine

## 2018-10-31 ENCOUNTER — Other Ambulatory Visit: Payer: Self-pay

## 2018-10-31 ENCOUNTER — Emergency Department (HOSPITAL_BASED_OUTPATIENT_CLINIC_OR_DEPARTMENT_OTHER)
Admission: EM | Admit: 2018-10-31 | Discharge: 2018-10-31 | Disposition: A | Discharge status: Home / Self Care | Payer: Self-pay | Attending: Emergency Medicine | Admitting: Emergency Medicine

## 2018-10-31 DIAGNOSIS — F17210 Nicotine dependence, cigarettes, uncomplicated: Secondary | ICD-10-CM | POA: Insufficient documentation

## 2018-10-31 DIAGNOSIS — K0889 Other specified disorders of teeth and supporting structures: Secondary | ICD-10-CM | POA: Insufficient documentation

## 2018-10-31 MED ORDER — AMOXICILLIN 500 MG PO CAPS: 500.0000 mg | ORAL_CAPSULE | Freq: Three times a day (TID) | ORAL | 0 refills | Status: DC

## 2018-10-31 MED ORDER — NAPROXEN 375 MG PO TABS: 375.0000 mg | ORAL_TABLET | Freq: Two times a day (BID) | ORAL | 0 refills | Status: DC

## 2018-10-31 NOTE — ED Triage Notes (Signed)
Pt c/o left upper toothache x couple days.

## 2018-10-31 NOTE — ED Provider Notes (Signed)
[facility] EMERGENCY DEPARTMENT Provider Note   CSN: [id number] Arrival date & time: 10/31/18  1445     History   Chief Complaint Chief Complaint  Patient presents with  . Dental Pain    HPI Karen Warren is a 33 y.o. female.  Patient is a 33 year old female who presents with dental pain.  She states her teeth have been hurting for the last 2 days.  She describes a constant throbbing pain in her left upper and lower back molars.  She denies any facial swelling.  No fevers.  No vomiting.  She has been using some over-the-counter medicines without improvement in symptoms.     Past Medical History:  Diagnosis Date  . Renal disorder    kidney infection    Patient Active Problem List   Diagnosis Date Noted  . Acute pyelonephritis 04/21/2014  . Sepsis (HCC) 04/21/2014  . Diarrhea 06/09/2012  . Leukocytosis 06/07/2012  . Anemia 06/07/2012  . Pyelonephritis 06/06/2012  . Abdominal pain 06/06/2012  . Pain, dental 06/06/2012  . Fever 06/06/2012  . Hypokalemia 06/06/2012    Past Surgical History:  Procedure Laterality Date  . CESAREAN SECTION    . CHOLECYSTECTOMY    . TUBAL LIGATION    . WISDOM TOOTH EXTRACTION       OB History   No obstetric history on file.      Home Medications    Prior to Admission medications   Medication Sig Start Date End Date Taking? Authorizing Provider  amoxicillin (AMOXIL) 500 MG capsule Take 1 capsule (500 mg total) by mouth 3 (three) times daily. 10/31/18   Rolan BuccoBelfi, Jasten Guyette, MD  bacitracin ointment Apply 1 application topically 2 (two) times daily. 01/03/18   Rise MuLeaphart, Kenneth T, PA-C  fluticasone (FLONASE) 50 MCG/ACT nasal spray Place 1 spray into both nostrils daily. Patient taking differently: Place 1 spray into both nostrils daily as needed for allergies.  11/29/16   Audry PiliMohr, Tyler, PA-C  HYDROcodone-acetaminophen (NORCO/VICODIN) 5-325 MG tablet Take 1-2 tablets by mouth every 6 (six) hours as needed for moderate pain.  02/12/18   Vanetta MuldersZackowski, Scott, MD  naproxen (NAPROSYN) 375 MG tablet Take 1 tablet (375 mg total) by mouth 2 (two) times daily. 10/31/18   Rolan BuccoBelfi, Nala Kachel, MD    Family History Family History  Problem Relation Age of Onset  . Hypertension Mother   . Hypertension Maternal Grandmother   . Diabetes Maternal Grandmother     Social History Social History   Tobacco Use  . Smoking status: Current Every Day Smoker    Packs/day: 0.30    Types: Cigarettes  . Smokeless tobacco: Never Used  Substance Use Topics  . Alcohol use: No  . Drug use: No     Allergies   Patient has no known allergies.   Review of Systems Review of Systems  Constitutional: Negative for fever.  HENT: Positive for dental problem.   Gastrointestinal: Negative for nausea and vomiting.  Musculoskeletal: Negative for neck pain.  Neurological: Positive for headaches.     Physical Exam Updated Vital Signs BP 133/80   Pulse 91   Temp 98.3 F (36.8 C) (Oral)   Resp 18   Ht 5\' 3"  (1.6 m)   Wt 81.6 kg   LMP 10/13/2018   SpO2 100%   BMI 31.89 kg/m   Physical Exam Constitutional:      Appearance: She is well-developed.  HENT:     Head: Normocephalic and atraumatic.     Mouth/Throat:  Comments: Positive tenderness over the left upper and lower back molars.  There is no associated swelling or palpable abscess noted.  No trismus.  Uvula is midline.  No elevation of the tongue.  No cervical lymphadenopathy Neck:     Musculoskeletal: Normal range of motion and neck supple.  Cardiovascular:     Rate and Rhythm: Normal rate.  Pulmonary:     Effort: Pulmonary effort is normal.  Musculoskeletal:        General: No tenderness.  Skin:    General: Skin is warm and dry.  Neurological:     Mental Status: She is alert and oriented to person, place, and time.      ED Treatments / Results  Labs (all labs ordered are listed, but only abnormal results are displayed) Labs Reviewed - No data to  display  EKG None  Radiology No results found.  Procedures Procedures (including critical care time)  Medications Ordered in ED Medications - No data to display   Initial Impression / Assessment and Plan / ED Course  I have reviewed the triage vital signs and the nursing notes.  Pertinent labs & imaging results that were available during my care of the patient were reviewed by me and considered in my medical decision making (see chart for details).     Patient with worsening dental pain.  She was started on amoxicillin and Naprosyn.  She was given a Facilities manager for lower cost dental clinics.  She was encouraged to try and establish care with a dentist.  Return precautions were given.  Final Clinical Impressions(s) / ED Diagnoses   Final diagnoses:  Pain, dental    ED Discharge Orders         Ordered    naproxen (NAPROSYN) 375 MG tablet  2 times daily     10/31/18 1519    amoxicillin (AMOXIL) 500 MG capsule  3 times daily     10/31/18 1519           Rolan Bucco, MD 10/31/18 1523

## 2019-10-29 ENCOUNTER — Emergency Department (HOSPITAL_BASED_OUTPATIENT_CLINIC_OR_DEPARTMENT_OTHER)
Admission: EM | Admit: 2019-10-29 | Discharge: 2019-10-29 | Disposition: A | Discharge status: Home / Self Care | Payer: Self-pay | Attending: Emergency Medicine | Admitting: Emergency Medicine

## 2019-10-29 ENCOUNTER — Encounter (HOSPITAL_BASED_OUTPATIENT_CLINIC_OR_DEPARTMENT_OTHER): Payer: Self-pay | Admitting: Emergency Medicine

## 2019-10-29 ENCOUNTER — Other Ambulatory Visit: Payer: Self-pay

## 2019-10-29 DIAGNOSIS — Z79899 Other long term (current) drug therapy: Secondary | ICD-10-CM | POA: Insufficient documentation

## 2019-10-29 DIAGNOSIS — K0889 Other specified disorders of teeth and supporting structures: Secondary | ICD-10-CM | POA: Insufficient documentation

## 2019-10-29 DIAGNOSIS — F17210 Nicotine dependence, cigarettes, uncomplicated: Secondary | ICD-10-CM | POA: Insufficient documentation

## 2019-10-29 MED ORDER — CLINDAMYCIN HCL 150 MG PO CAPS: 150.0000 mg | ORAL_CAPSULE | Freq: Three times a day (TID) | ORAL | 0 refills | Status: AC

## 2019-10-29 NOTE — ED Provider Notes (Signed)
[facility] EMERGENCY DEPARTMENT Provider Note   CSN: [id number] Arrival date & time: 10/29/19  7829     History Chief Complaint  Patient presents with  . Dental Pain    Karen Warren is a 34 y.o. female.  The history is provided by the patient.  Dental Pain Location:  Lower Quality:  Aching Severity:  Mild Onset quality:  Gradual Timing:  Intermittent Progression:  Waxing and waning Chronicity:  New Context: poor dentition   Relieved by:  Nothing Worsened by:  Nothing Associated symptoms: no congestion, no difficulty swallowing, no drooling, no facial pain, no facial swelling, no fever, no gum swelling, no headaches, no neck pain, no neck swelling, no oral bleeding and no oral lesions   Risk factors: no diabetes        Past Medical History:  Diagnosis Date  . Renal disorder    kidney infection    Patient Active Problem List   Diagnosis Date Noted  . Acute pyelonephritis 04/21/2014  . Sepsis ([place]) 04/21/2014  . Diarrhea 06/09/2012  . Leukocytosis 06/07/2012  . Anemia 06/07/2012  . Pyelonephritis 06/06/2012  . Abdominal pain 06/06/2012  . Pain, dental 06/06/2012  . Fever 06/06/2012  . Hypokalemia 06/06/2012    Past Surgical History:  Procedure Laterality Date  . CESAREAN SECTION    . CHOLECYSTECTOMY    . TUBAL LIGATION    . WISDOM TOOTH EXTRACTION       OB History   No obstetric history on file.     Family History  Problem Relation Age of Onset  . Hypertension Mother   . Hypertension Maternal Grandmother   . Diabetes Maternal Grandmother     Social History   Tobacco Use  . Smoking status: Current Every Day Smoker    Packs/day: 0.30    Types: Cigarettes  . Smokeless tobacco: Never Used  Substance Use Topics  . Alcohol use: No  . Drug use: No    Home Medications Prior to Admission medications   Medication Sig Start Date End Date Taking? Authorizing Provider  amoxicillin (AMOXIL) 500 MG capsule Take 1 capsule (500 mg  total) by mouth 3 (three) times daily. 10/31/18   Malvin Johns, MD  bacitracin ointment Apply 1 application topically 2 (two) times daily. 01/03/18   Doristine Devoid, PA-C  clindamycin (CLEOCIN) 150 MG capsule Take 1 capsule (150 mg total) by mouth 3 (three) times daily for 10 days. 10/29/19 11/08/19  Derica Leiber, DO  fluticasone (FLONASE) 50 MCG/ACT nasal spray Place 1 spray into both nostrils daily. Patient taking differently: Place 1 spray into both nostrils daily as needed for allergies.  11/29/16   Shary Decamp, PA-C  HYDROcodone-acetaminophen (NORCO/VICODIN) 5-325 MG tablet Take 1-2 tablets by mouth every 6 (six) hours as needed for moderate pain. 02/12/18   Fredia Sorrow, MD  naproxen (NAPROSYN) 375 MG tablet Take 1 tablet (375 mg total) by mouth 2 (two) times daily. 10/31/18   Malvin Johns, MD    Allergies    Patient has no known allergies.  Review of Systems   Review of Systems  Constitutional: Negative for fever.  HENT: Negative for congestion, drooling, facial swelling and mouth sores.   Musculoskeletal: Negative for neck pain.  Neurological: Negative for headaches.    Physical Exam Updated Vital Signs BP 118/76 (BP Location: Left Arm)   Pulse 97   Temp 99.2 F (37.3 C) (Oral)   Resp 18   Ht 5\' 3"  (1.6 m)   Wt 81.6 kg  LMP 10/19/2019   SpO2 100%   BMI 31.89 kg/m   Physical Exam Vitals and nursing note reviewed.  Constitutional:      General: She is not in acute distress.    Appearance: She is well-developed.  HENT:     Head: Normocephalic and atraumatic.     Comments: Lower left molar cracked and exposed but no obvious abscess or swelling Eyes:     Conjunctiva/sclera: Conjunctivae normal.  Cardiovascular:     Rate and Rhythm: Normal rate and regular rhythm.     Heart sounds: No murmur.  Pulmonary:     Effort: Pulmonary effort is normal. No respiratory distress.     Breath sounds: Normal breath sounds.  Abdominal:     Palpations: Abdomen is soft.      Tenderness: There is no abdominal tenderness.  Musculoskeletal:     Cervical back: Neck supple.  Skin:    General: Skin is warm and dry.  Neurological:     Mental Status: She is alert.     ED Results / Procedures / Treatments   Labs (all labs ordered are listed, but only abnormal results are displayed) Labs Reviewed - No data to display  EKG None  Radiology No results found.  Procedures Procedures (including critical care time)  Medications Ordered in ED Medications - No data to display  ED Course  I have reviewed the triage vital signs and the nursing notes.  Pertinent labs & imaging results that were available during my care of the patient were reviewed by me and considered in my medical decision making (see chart for details).    MDM Rules/Calculators/A&P  Karen Warren is a 34 year old female with left lower tooth pain.  She appears to have a cracked molar with no major surrounding infectious findings.  Given information for dentistry follow-up.  Prescribed clindamycin.  Given return precautions.  Recommend Tylenol Motrin for pain.  This chart was dictated using voice recognition software.  Despite best efforts to proofread,  errors can occur which can change the documentation meaning.   Final Clinical Impression(s) / ED Diagnoses Final diagnoses:  Pain, dental    Rx / DC Orders ED Discharge Orders         Ordered    clindamycin (CLEOCIN) 150 MG capsule  3 times daily     10/29/19 1119           Bascom, Madelaine Bhat, DO 10/29/19 1121

## 2019-10-29 NOTE — ED Triage Notes (Signed)
Patient states that she has had pain and a " bump" to her left lower jaw x 1 week

## 2019-12-24 ENCOUNTER — Emergency Department (HOSPITAL_BASED_OUTPATIENT_CLINIC_OR_DEPARTMENT_OTHER)
Admission: EM | Admit: 2019-12-24 | Discharge: 2019-12-24 | Disposition: A | Discharge status: Home / Self Care | Payer: No Typology Code available for payment source | Attending: Emergency Medicine | Admitting: Emergency Medicine

## 2019-12-24 ENCOUNTER — Emergency Department (HOSPITAL_BASED_OUTPATIENT_CLINIC_OR_DEPARTMENT_OTHER): Payer: Worker's Compensation

## 2019-12-24 ENCOUNTER — Other Ambulatory Visit: Payer: Self-pay

## 2019-12-24 DIAGNOSIS — Z79899 Other long term (current) drug therapy: Secondary | ICD-10-CM | POA: Insufficient documentation

## 2019-12-24 DIAGNOSIS — M25562 Pain in left knee: Secondary | ICD-10-CM

## 2019-12-24 DIAGNOSIS — F17210 Nicotine dependence, cigarettes, uncomplicated: Secondary | ICD-10-CM | POA: Diagnosis not present

## 2019-12-24 MED ORDER — ONDANSETRON 4 MG PO TBDP
4.0000 mg | ORAL_TABLET | Freq: Once | ORAL | Status: AC
  Administered 2019-12-24: 08:00:00 4 mg via ORAL
  Filled 2019-12-24: qty 1

## 2019-12-24 MED ORDER — IBUPROFEN 600 MG PO TABS: 600.0000 mg | ORAL_TABLET | Freq: Four times a day (QID) | ORAL | 0 refills | Status: DC | PRN

## 2019-12-24 MED ORDER — HYDROCODONE-ACETAMINOPHEN 5-325 MG PO TABS: 1.0000 | ORAL_TABLET | ORAL | 0 refills | Status: DC | PRN

## 2019-12-24 MED ORDER — MORPHINE SULFATE (PF) 4 MG/ML IV SOLN
4.0000 mg | Freq: Once | INTRAVENOUS | Status: AC
  Administered 2019-12-24: 4 mg via INTRAMUSCULAR
  Filled 2019-12-24: qty 1

## 2019-12-24 NOTE — ED Notes (Signed)
Crutch fitting and instruction in progress, knee immobilizer in place.

## 2019-12-24 NOTE — ED Provider Notes (Signed)
[facility] EMERGENCY DEPARTMENT Provider Note   CSN: [id number] Arrival date & time: 12/24/19  1194     History Chief Complaint  Patient presents with  . Knee Pain    Karen Warren is a 34 y.o. female.  Pt presents to the ED today with left knee pain.  She was at work last night at the [employer] and was in an elevator which became stuck between 2 floors.  The fire department had to be called.  They had a hard time getting her out.  During the rescue effort, she fell on her left knee.  She iced it initially, then woke up with worsening pain.  She is unable to bear any weight.  She is having pain just sitting in bed.          Past Medical History:  Diagnosis Date  . Renal disorder    kidney infection    Patient Active Problem List   Diagnosis Date Noted  . Acute pyelonephritis 04/21/2014  . Sepsis (HCC) 04/21/2014  . Diarrhea 06/09/2012  . Leukocytosis 06/07/2012  . Anemia 06/07/2012  . Pyelonephritis 06/06/2012  . Abdominal pain 06/06/2012  . Pain, dental 06/06/2012  . Fever 06/06/2012  . Hypokalemia 06/06/2012    Past Surgical History:  Procedure Laterality Date  . CESAREAN SECTION    . CHOLECYSTECTOMY    . TUBAL LIGATION    . WISDOM TOOTH EXTRACTION       OB History   No obstetric history on file.     Family History  Problem Relation Age of Onset  . Hypertension Mother   . Hypertension Maternal Grandmother   . Diabetes Maternal Grandmother     Social History   Tobacco Use  . Smoking status: Current Every Day Smoker    Packs/day: 0.30    Types: Cigarettes  . Smokeless tobacco: Never Used  Substance Use Topics  . Alcohol use: No  . Drug use: No    Home Medications Prior to Admission medications   Medication Sig Start Date End Date Taking? Authorizing Provider  amoxicillin (AMOXIL) 500 MG capsule Take 1 capsule (500 mg total) by mouth 3 (three) times daily. 10/31/18   Rolan Bucco, MD  bacitracin ointment Apply 1 application  topically 2 (two) times daily. 01/03/18   Rise Mu, PA-C  fluticasone (FLONASE) 50 MCG/ACT nasal spray Place 1 spray into both nostrils daily. Patient taking differently: Place 1 spray into both nostrils daily as needed for allergies.  11/29/16   Audry Pili, PA-C  HYDROcodone-acetaminophen (NORCO/VICODIN) 5-325 MG tablet Take 1 tablet by mouth every 4 (four) hours as needed. 12/24/19   Jacalyn Lefevre, MD  ibuprofen (ADVIL) 600 MG tablet Take 1 tablet (600 mg total) by mouth every 6 (six) hours as needed. 12/24/19   Jacalyn Lefevre, MD  naproxen (NAPROSYN) 375 MG tablet Take 1 tablet (375 mg total) by mouth 2 (two) times daily. 10/31/18   Rolan Bucco, MD    Allergies    Patient has no known allergies.  Review of Systems   Review of Systems  Musculoskeletal:       Left knee pain  All other systems reviewed and are negative.   Physical Exam Updated Vital Signs BP 125/61 (BP Location: Right Arm)   Pulse (!) 109   Temp 98.2 F (36.8 C) (Oral)   Resp (!) 22   Ht 5\' 2"  (1.575 m)   Wt 82.6 kg   SpO2 99%   BMI 33.29 kg/m  Physical Exam Vitals and nursing note reviewed.  Constitutional:      Appearance: Normal appearance.  HENT:     Head: Normocephalic and atraumatic.     Right Ear: External ear normal.     Left Ear: External ear normal.     Nose: Nose normal.     Mouth/Throat:     Mouth: Mucous membranes are moist.     Pharynx: Oropharynx is clear.  Eyes:     Extraocular Movements: Extraocular movements intact.     Conjunctiva/sclera: Conjunctivae normal.     Pupils: Pupils are equal, round, and reactive to light.  Cardiovascular:     Rate and Rhythm: Normal rate and regular rhythm.     Pulses: Normal pulses.     Heart sounds: Normal heart sounds.  Pulmonary:     Effort: Pulmonary effort is normal.     Breath sounds: Normal breath sounds.  Abdominal:     General: Abdomen is flat. Bowel sounds are normal.     Palpations: Abdomen is soft.  Musculoskeletal:      Cervical back: Normal range of motion and neck supple.     Left knee: Swelling present. Decreased range of motion. Tenderness present.  Skin:    General: Skin is warm.     Capillary Refill: Capillary refill takes less than 2 seconds.  Neurological:     General: No focal deficit present.     Mental Status: She is alert and oriented to person, place, and time.  Psychiatric:        Mood and Affect: Mood normal.        Behavior: Behavior normal.     ED Results / Procedures / Treatments   Labs (all labs ordered are listed, but only abnormal results are displayed) Labs Reviewed - No data to display  EKG None  Radiology DG Knee Complete 4 Views Left  Result Date: 12/24/2019 CLINICAL DATA:  Posttraumatic right knee pain EXAM: LEFT KNEE - COMPLETE 4+ VIEW COMPARISON:  None. FINDINGS: No evidence of fracture, dislocation, or joint effusion. No evidence of arthropathy or other focal bone abnormality. Soft tissues are unremarkable. IMPRESSION: Negative. Electronically Signed   By: Monte Fantasia M.D.   On: 12/24/2019 07:49    Procedures Procedures (including critical care time)  Medications Ordered in ED Medications  morphine 4 MG/ML injection 4 mg (4 mg Intramuscular Given 12/24/19 0747)  ondansetron (ZOFRAN-ODT) disintegrating tablet 4 mg (4 mg Oral Given 12/24/19 0747)    ED Course  I have reviewed the triage vital signs and the nursing notes.  Pertinent labs & imaging results that were available during my care of the patient were reviewed by me and considered in my medical decision making (see chart for details).    MDM Rules/Calculators/A&P                      No fx. Pt placed in a knee immobilizer and crutches.  Return if worse.  F/u with ortho.  Final Clinical Impression(s) / ED Diagnoses Final diagnoses:  Acute pain of left knee    Rx / DC Orders ED Discharge Orders         Ordered    HYDROcodone-acetaminophen (NORCO/VICODIN) 5-325 MG tablet  Every 4 hours PRN       12/24/19 0757    ibuprofen (ADVIL) 600 MG tablet  Every 6 hours PRN     12/24/19 0757           Isla Pence, MD  12/24/19 0758  

## 2019-12-24 NOTE — ED Triage Notes (Signed)
Pt states was stuck in elevator at work yesterday, when FD rescued her, knee struck side of elevator, iced at time of injury, awoke with increased pain, and unable to bear weight without pain.  Last took tylenol last night.

## 2020-01-21 ENCOUNTER — Other Ambulatory Visit: Payer: Self-pay

## 2020-01-21 ENCOUNTER — Encounter (HOSPITAL_BASED_OUTPATIENT_CLINIC_OR_DEPARTMENT_OTHER): Payer: Self-pay

## 2020-01-21 ENCOUNTER — Emergency Department (HOSPITAL_BASED_OUTPATIENT_CLINIC_OR_DEPARTMENT_OTHER)
Admission: EM | Admit: 2020-01-21 | Discharge: 2020-01-21 | Disposition: A | Discharge status: Home / Self Care | Payer: Self-pay | Attending: Emergency Medicine | Admitting: Emergency Medicine

## 2020-01-21 DIAGNOSIS — F17210 Nicotine dependence, cigarettes, uncomplicated: Secondary | ICD-10-CM | POA: Insufficient documentation

## 2020-01-21 DIAGNOSIS — K047 Periapical abscess without sinus: Secondary | ICD-10-CM | POA: Insufficient documentation

## 2020-01-21 DIAGNOSIS — Z9049 Acquired absence of other specified parts of digestive tract: Secondary | ICD-10-CM | POA: Insufficient documentation

## 2020-01-21 DIAGNOSIS — K0889 Other specified disorders of teeth and supporting structures: Secondary | ICD-10-CM

## 2020-01-21 MED ORDER — NAPROXEN 500 MG PO TABS: 500.0000 mg | ORAL_TABLET | Freq: Two times a day (BID) | ORAL | 0 refills | Status: DC

## 2020-01-21 MED ORDER — AMOXICILLIN 500 MG PO CAPS
500.0000 mg | ORAL_CAPSULE | Freq: Once | ORAL | Status: AC
  Administered 2020-01-21: 15:00:00 500 mg via ORAL

## 2020-01-21 MED ORDER — AMOXICILLIN 500 MG PO CAPS: 500.0000 mg | ORAL_CAPSULE | Freq: Two times a day (BID) | ORAL | 0 refills | Status: AC

## 2020-01-21 MED ORDER — AMOXICILLIN 500 MG PO CAPS
500.0000 mg | ORAL_CAPSULE | Freq: Once | ORAL | Status: DC
  Filled 2020-01-21: qty 1

## 2020-01-21 NOTE — Discharge Instructions (Signed)
Take antibiotics as directed. Please take all of your antibiotics until finished.  You can take Tylenol or Ibuprofen as directed for pain. You can alternate Tylenol and Ibuprofen every 4 hours. If you take Tylenol at 1pm, then you can take Ibuprofen at 5pm. Then you can take Tylenol again at 9pm.   The exam and treatment you received today has been provided on an emergency basis only. This is not a substitute for complete medical or dental care. If your problem worsens or new symptoms (problems) appear, and you are unable to arrange prompt follow-up care with your dentist, call or return to this location. If you do not have a dentist, please follow-up with one on the list provided  CALL YOUR DENTIST OR RETURN IMMEDIATELY IF you develop a fever, rash, difficulty breathing or swallowing, neck or facial swelling, or other potentially serious concerns.   Please follow-up with one of the dental clinics provided to you below or in your paperwork. Call and tell them you were seen in the Emergency Dept and arrange for an appointment. You may have to call multiple places in order to find a place to be seen.  Dental Assistance If the dentist on-call cannot see you, please use the resources below:   Patients with Medicaid: [facility] [address], [phone], [phone]  If unable to pay, or uninsured, contact [facility] ([phone]) or [facility] ([phone] in [place], [phone] in [place]) to become qualified for the adult dental clinic  Other Low-Cost Community Dental Services: [facility]- [address], [place], [place], [address]    [phone], Ext. [phone]    2nd and 4th Thursday of the month at 6:30am    10 clients each day by appointment, can sometimes see walk-in     patients if someone does not show for an appointment [facility]- [address], [place], [place], [address]    [phone]  [facility]- [address], [place], [place], [address]    [phone]  [facility]- [phone] [facility]- [phone] [facility]- [phone]  

## 2020-01-21 NOTE — ED Provider Notes (Signed)
[facility] EMERGENCY DEPARTMENT Provider Note   CSN: [id number] Arrival date & time: 01/21/20  1323     History Chief Complaint  Patient presents with  . Dental Pain    Karen Warren is a 34 y.o. female who presents for evaluation of left-sided dental pain and left-sided facial swelling that began last night.  Patient reports that she has had a history of a broken left molar for about a month.  She has not sought evaluation for it.  She reported last night she started noticing some pain when she woke up this morning the lower half of her face was swollen.  No overlying warmth, erythema.  She states she can still swallow but it does hurt more when she does so.  She has not had any vomiting, difficulty breathing.  She does not have a dentist that she follows up with.  She has taken Tylenol this morning with no improvement in symptoms.  She has not tried any other medications.  The history is provided by the patient.       Past Medical History:  Diagnosis Date  . Renal disorder    kidney infection    Patient Active Problem List   Diagnosis Date Noted  . Acute pyelonephritis 04/21/2014  . Sepsis (HCC) 04/21/2014  . Diarrhea 06/09/2012  . Leukocytosis 06/07/2012  . Anemia 06/07/2012  . Pyelonephritis 06/06/2012  . Abdominal pain 06/06/2012  . Pain, dental 06/06/2012  . Fever 06/06/2012  . Hypokalemia 06/06/2012    Past Surgical History:  Procedure Laterality Date  . CESAREAN SECTION    . CHOLECYSTECTOMY    . TUBAL LIGATION    . WISDOM TOOTH EXTRACTION       OB History   No obstetric history on file.     Family History  Problem Relation Age of Onset  . Hypertension Mother   . Hypertension Maternal Grandmother   . Diabetes Maternal Grandmother     Social History   Tobacco Use  . Smoking status: Current Every Day Smoker    Packs/day: 0.30    Types: Cigarettes  . Smokeless tobacco: Never Used  Substance Use Topics  . Alcohol use: No  . Drug  use: No    Home Medications Prior to Admission medications   Medication Sig Start Date End Date Taking? Authorizing Provider  amoxicillin (AMOXIL) 500 MG capsule Take 1 capsule (500 mg total) by mouth 2 (two) times daily for 7 days. 01/21/20 01/28/20  Graciella Freer A, PA-C  bacitracin ointment Apply 1 application topically 2 (two) times daily. 01/03/18   Rise Mu, PA-C  fluticasone (FLONASE) 50 MCG/ACT nasal spray Place 1 spray into both nostrils daily. Patient taking differently: Place 1 spray into both nostrils daily as needed for allergies.  11/29/16   Audry Pili, PA-C  HYDROcodone-acetaminophen (NORCO/VICODIN) 5-325 MG tablet Take 1 tablet by mouth every 4 (four) hours as needed. 12/24/19   Jacalyn Lefevre, MD  ibuprofen (ADVIL) 600 MG tablet Take 1 tablet (600 mg total) by mouth every 6 (six) hours as needed. 12/24/19   Jacalyn Lefevre, MD  naproxen (NAPROSYN) 500 MG tablet Take 1 tablet (500 mg total) by mouth 2 (two) times daily. 01/21/20   Maxwell Caul, PA-C    Allergies    Patient has no known allergies.  Review of Systems   Review of Systems  Constitutional: Negative for fever.  HENT: Positive for dental problem and facial swelling. Negative for trouble swallowing.   Respiratory: Negative for  shortness of breath.   Gastrointestinal: Negative for vomiting.  All other systems reviewed and are negative.   Physical Exam Updated Vital Signs BP 123/66 (BP Location: Left Arm)   Pulse 92   Temp 98.2 F (36.8 C) (Oral)   Resp 18   Ht 5\' 2"  (1.575 m)   Wt 81.6 kg   LMP 01/21/2020   SpO2 99%   BMI 32.92 kg/m   Physical Exam Vitals and nursing note reviewed.  Constitutional:      Appearance: She is well-developed.  HENT:     Head: Normocephalic and atraumatic.      Comments: Mild soft tissue swelling noted to the left lower face.  No overlying warmth, erythema.  No swelling noted to the chin, neck.  No submandibular swelling.    Mouth/Throat:     Comments:  Raise pain, phonation is intact.  Uvula is midline.  No swelling noted floor of mouth.  Tooth #19 is cracked with some surrounding gingival edema.  She has tenderness noted to the left lower gum area.  No obvious area of fluctuance.  No trismus. Eyes:     General: No scleral icterus.       Right eye: No discharge.        Left eye: No discharge.     Conjunctiva/sclera: Conjunctivae normal.  Pulmonary:     Effort: Pulmonary effort is normal.  Skin:    General: Skin is warm and dry.  Neurological:     Mental Status: She is alert.  Psychiatric:        Speech: Speech normal.        Behavior: Behavior normal.     ED Results / Procedures / Treatments   Labs (all labs ordered are listed, but only abnormal results are displayed) Labs Reviewed - No data to display  EKG None  Radiology No results found.  Procedures Procedures (including critical care time)  Medications Ordered in ED Medications  amoxicillin (AMOXIL) capsule 500 mg (500 mg Oral Given 01/21/20 1433)    ED Course  I have reviewed the triage vital signs and the nursing notes.  Pertinent labs & imaging results that were available during my care of the patient were reviewed by me and considered in my medical decision making (see chart for details).    MDM Rules/Calculators/A&P                      34 year old female who presents for evaluation of 1 day of dental pain, left-sided facial swelling.  No fevers, difficulty tolerating secretions.  On initial ED arrival, she is afebrile, nontoxic-appearing.  Vital signs are stable.  History/physical exam is not concerning for Ludwig's angina or peritonsillar abscess.  Exam is consistent with dental abscess.  At this time, there is no obvious area of fluctuance that would be amenable to I&D here in the department.  Suspect this is most likely periapical abscess.  Patient with no known drug allergies.  We will plan to treat with amoxicillin.  We will plan to give her outpatient  dental clinic follow-up.  Encouraged follow-up with dentist for resolution of symptoms. At this time, patient exhibits no emergent life-threatening condition that require further evaluation in ED or admission. Patient had ample opportunity for questions and discussion. All patient's questions were answered with full understanding. Strict return precautions discussed. Patient expresses understanding and agreement to plan.   Portions of this note were generated with 34. Dictation errors may occur despite best  attempts at proofreading.  Final Clinical Impression(s) / ED Diagnoses Final diagnoses:  Dental abscess  Pain, dental    Rx / DC Orders ED Discharge Orders         Ordered    amoxicillin (AMOXIL) 500 MG capsule  2 times daily     01/21/20 1426    naproxen (NAPROSYN) 500 MG tablet  2 times daily     01/21/20 1426           Volanda Napoleon, PA-C 01/21/20 [place], [place], DO 01/21/20 1456

## 2020-01-21 NOTE — ED Triage Notes (Signed)
Pt has had a broken tooth on the L side for 1 month. Today pt having dental pain and swelling on L side.

## 2021-04-15 ENCOUNTER — Emergency Department (HOSPITAL_COMMUNITY): Payer: Self-pay

## 2021-04-15 ENCOUNTER — Other Ambulatory Visit: Payer: Self-pay

## 2021-04-15 ENCOUNTER — Emergency Department (HOSPITAL_COMMUNITY)
Admission: EM | Admit: 2021-04-15 | Discharge: 2021-04-15 | Disposition: A | Discharge status: Home / Self Care | Payer: Self-pay | Attending: Emergency Medicine | Admitting: Emergency Medicine

## 2021-04-15 ENCOUNTER — Encounter (HOSPITAL_COMMUNITY): Payer: Self-pay

## 2021-04-15 DIAGNOSIS — F17210 Nicotine dependence, cigarettes, uncomplicated: Secondary | ICD-10-CM | POA: Insufficient documentation

## 2021-04-15 DIAGNOSIS — N200 Calculus of kidney: Secondary | ICD-10-CM | POA: Insufficient documentation

## 2021-04-15 DIAGNOSIS — N946 Dysmenorrhea, unspecified: Secondary | ICD-10-CM | POA: Insufficient documentation

## 2021-04-15 LAB — URINALYSIS, ROUTINE W REFLEX MICROSCOPIC
Bilirubin Urine: NEGATIVE
Glucose, UA: NEGATIVE mg/dL
Ketones, ur: NEGATIVE mg/dL
Leukocytes,Ua: NEGATIVE
Nitrite: NEGATIVE
Protein, ur: 100 mg/dL — AB
RBC / HPF: >50 RBC/hpf — ABNORMAL HIGH (ref 0–5)
Specific Gravity, Urine: 1.03 (ref 1.005–1.030)
pH: 6 (ref 5.0–8.0)

## 2021-04-15 LAB — I-STAT BETA HCG BLOOD, ED (MC, WL, AP ONLY): I-stat hCG, quantitative: <5 m[IU]/mL (ref <5)

## 2021-04-15 LAB — COMPREHENSIVE METABOLIC PANEL
ALT: 15 U/L (ref 0–44)
AST: 16 U/L (ref 15–41)
Albumin: 4 g/dL (ref 3.5–5.0)
Alkaline Phosphatase: 42 U/L (ref 38–126)
Anion gap: 6 (ref 5–15)
BUN: 13 mg/dL (ref 6–20)
CO2: 25 mmol/L (ref 22–32)
Calcium: 8.7 mg/dL — ABNORMAL LOW (ref 8.9–10.3)
Chloride: 108 mmol/L (ref 98–111)
Creatinine, Ser: 0.75 mg/dL (ref 0.44–1.00)
GFR, Estimated: >60 mL/min (ref >60)
Glucose, Bld: 69 mg/dL — ABNORMAL LOW (ref 70–99)
Potassium: 3.6 mmol/L (ref 3.5–5.1)
Sodium: 139 mmol/L (ref 135–145)
Total Bilirubin: 0.4 mg/dL (ref 0.3–1.2)
Total Protein: 6.7 g/dL (ref 6.5–8.1)

## 2021-04-15 LAB — CBC
HCT: 36.1 % (ref 36.0–46.0)
Hemoglobin: 12.1 g/dL (ref 12.0–15.0)
MCH: 30.3 pg (ref 26.0–34.0)
MCHC: 33.5 g/dL (ref 30.0–36.0)
MCV: 90.5 fL (ref 80.0–100.0)
Platelets: 334 10*3/uL (ref 150–400)
RBC: 3.99 MIL/uL (ref 3.87–5.11)
RDW: 14.7 % (ref 11.5–15.5)
WBC: 6.9 10*3/uL (ref 4.0–10.5)
nRBC: 0 % (ref 0.0–0.2)

## 2021-04-15 LAB — LIPASE, BLOOD: Lipase: 35 U/L (ref 11–51)

## 2021-04-15 MED ORDER — NAPROXEN 500 MG PO TABS: 500.0000 mg | ORAL_TABLET | Freq: Two times a day (BID) | ORAL | 0 refills | Status: DC

## 2021-04-15 MED ORDER — KETOROLAC TROMETHAMINE 30 MG/ML IJ SOLN
30.0000 mg | Freq: Once | INTRAMUSCULAR | Status: AC
  Administered 2021-04-15: 30 mg via INTRAVENOUS
  Filled 2021-04-15: qty 1

## 2021-04-15 MED ORDER — ACETAMINOPHEN 325 MG PO TABS
650.0000 mg | ORAL_TABLET | Freq: Once | ORAL | Status: AC
Start: 2021-04-15 — End: 2021-04-15
  Administered 2021-04-15: 650 mg via ORAL
  Filled 2021-04-15: qty 2

## 2021-04-15 MED ORDER — ONDANSETRON 4 MG PO TBDP
4.0000 mg | ORAL_TABLET | Freq: Once | ORAL | Status: AC
  Administered 2021-04-15: 4 mg via ORAL
  Filled 2021-04-15: qty 1

## 2021-04-15 MED ORDER — ONDANSETRON 4 MG PO TBDP: 4.0000 mg | ORAL_TABLET | Freq: Three times a day (TID) | ORAL | 0 refills | Status: DC | PRN

## 2021-04-15 MED ORDER — DICYCLOMINE HCL 10 MG/ML IM SOLN
20.0000 mg | Freq: Once | INTRAMUSCULAR | Status: AC
  Administered 2021-04-15: 20 mg via INTRAMUSCULAR
  Filled 2021-04-15: qty 2

## 2021-04-15 MED ORDER — IOHEXOL 300 MG/ML  SOLN
100.0000 mL | Freq: Once | INTRAMUSCULAR | Status: AC | PRN
  Administered 2021-04-15: 100 mL via INTRAVENOUS

## 2021-04-15 NOTE — ED Triage Notes (Addendum)
Per EMS, patient from home c/o RLQ pain since starting menstrual cycle x one hour ago. Denies vomiting and diarrhea.

## 2021-04-15 NOTE — ED Provider Notes (Signed)
Emergency Medicine Provider Triage Evaluation Note  Karen Warren , a 35 y.o. female  was evaluated in triage.  Pt complains of right lower quadrant abdominal pain for the past hour.  States that she started her menstrual cycle today.  Reports generally feeling weak and nauseous.  She has had frequent bowel movements over the past few days..  Review of Systems  Positive: Abdominal pain, diarrhea, nausea Negative: Vomiting, urinary symptom  Physical Exam  BP 117/68 (BP Location: Right Arm)   Pulse 75   Temp 99 F (37.2 C) (Oral)   Resp 18   LMP 04/15/2021   SpO2 96%  Gen:   Awake, no distress   Resp:  Normal effort  MSK:   Moves extremities without difficulty  Other:  Lower abdominal tenderness without rebound or guarding  Medical Decision Making  Medically screening exam initiated at 3:10 PM.  Appropriate orders placed.  Shirlee Latch was informed that the remainder of the evaluation will be completed by another provider, this initial triage assessment does not replace that evaluation, and the importance of remaining in the ED until their evaluation is complete.  Lab work ordered and given Tylenol and zofran   Dietrich Pates, Cordelia Poche 04/15/21 1512    Gwyneth Sprout, MD 04/17/21 2222

## 2021-04-15 NOTE — ED Provider Notes (Signed)
[name]-EMERGENCY DEPT Provider Note   CSN: [id number] Arrival date & time: 04/15/21  1442     History Chief Complaint  Patient presents with   Abdominal Pain    Karen Warren is a 35 y.o. female with a past medical history of anemia presenting to the ED for right lower quadrant pain.  Symptoms began earlier today.  She started her menstrual cycle today and is unsure if this caused the symptoms.  Reports associated nausea and generalized weakness as well.  Has not tried medications to help with symptoms.  Reports frequent bowel movements over the past few days but denies any bloody stools.  No fever, chest pain, shortness of breath or sick contacts with similar symptoms.  No vaginal discharge or abnormal bleeding.  HPI     Past Medical History:  Diagnosis Date   Renal disorder    kidney infection    Patient Active Problem List   Diagnosis Date Noted   Acute pyelonephritis 04/21/2014   Sepsis (HCC) 04/21/2014   Diarrhea 06/09/2012   Leukocytosis 06/07/2012   Anemia 06/07/2012   Pyelonephritis 06/06/2012   Abdominal pain 06/06/2012   Pain, dental 06/06/2012   Fever 06/06/2012   Hypokalemia 06/06/2012    Past Surgical History:  Procedure Laterality Date   CESAREAN SECTION     CHOLECYSTECTOMY     TUBAL LIGATION     WISDOM TOOTH EXTRACTION       OB History   No obstetric history on file.     Family History  Problem Relation Age of Onset   Hypertension Mother    Hypertension Maternal Grandmother    Diabetes Maternal Grandmother     Social History   Tobacco Use   Smoking status: Every Day    Packs/day: 0.30    Pack years: 0.00    Types: Cigarettes   Smokeless tobacco: Never  Vaping Use   Vaping Use: Never used  Substance Use Topics   Alcohol use: No   Drug use: No    Home Medications Prior to Admission medications   Medication Sig Start Date End Date Taking? Authorizing Provider  naproxen (NAPROSYN) 500 MG tablet Take 1  tablet (500 mg total) by mouth 2 (two) times daily. 04/15/21  Yes Johnnisha Forton, PA-C  ondansetron (ZOFRAN ODT) 4 MG disintegrating tablet Take 1 tablet (4 mg total) by mouth every 8 (eight) hours as needed for nausea or vomiting. 04/15/21  Yes Deolinda Frid, PA-C  bacitracin ointment Apply 1 application topically 2 (two) times daily. 01/03/18   Rise Mu, PA-C  fluticasone (FLONASE) 50 MCG/ACT nasal spray Place 1 spray into both nostrils daily. Patient taking differently: Place 1 spray into both nostrils daily as needed for allergies.  11/29/16   Audry Pili, PA-C  HYDROcodone-acetaminophen (NORCO/VICODIN) 5-325 MG tablet Take 1 tablet by mouth every 4 (four) hours as needed. 12/24/19   Jacalyn Lefevre, MD  ibuprofen (ADVIL) 600 MG tablet Take 1 tablet (600 mg total) by mouth every 6 (six) hours as needed. 12/24/19   Jacalyn Lefevre, MD    Allergies    Patient has no known allergies.  Review of Systems   Review of Systems  Constitutional:  Negative for appetite change, chills and fever.  HENT:  Negative for ear pain, rhinorrhea, sneezing and sore throat.   Eyes:  Negative for photophobia and visual disturbance.  Respiratory:  Negative for cough, chest tightness, shortness of breath and wheezing.   Cardiovascular:  Negative for chest pain and  palpitations.  Gastrointestinal:  Positive for abdominal pain, diarrhea and nausea. Negative for blood in stool, constipation and vomiting.  Genitourinary:  Negative for dysuria, hematuria and urgency.  Musculoskeletal:  Negative for myalgias.  Skin:  Negative for rash.  Neurological:  Negative for dizziness, weakness and light-headedness.   Physical Exam Updated Vital Signs BP 105/70 (BP Location: Right Arm)   Pulse 75   Temp 98.5 F (36.9 C) (Oral)   Resp 18   LMP 04/15/2021   SpO2 99%   Physical Exam Vitals and nursing note reviewed.  Constitutional:      General: She is not in acute distress.    Appearance: She is well-developed.   HENT:     Head: Normocephalic and atraumatic.     Nose: Nose normal.  Eyes:     General: No scleral icterus.       Left eye: No discharge.     Conjunctiva/sclera: Conjunctivae normal.  Cardiovascular:     Rate and Rhythm: Normal rate and regular rhythm.     Heart sounds: Normal heart sounds. No murmur heard.   No friction rub. No gallop.  Pulmonary:     Effort: Pulmonary effort is normal. No respiratory distress.     Breath sounds: Normal breath sounds.  Abdominal:     General: Bowel sounds are normal. There is no distension.     Palpations: Abdomen is soft.     Tenderness: There is abdominal tenderness in the right lower quadrant. There is no guarding.  Musculoskeletal:        General: Normal range of motion.     Cervical back: Normal range of motion and neck supple.  Skin:    General: Skin is warm and dry.     Findings: No rash.  Neurological:     Mental Status: She is alert.     Motor: No abnormal muscle tone.     Coordination: Coordination normal.    ED Results / Procedures / Treatments   Labs (all labs ordered are listed, but only abnormal results are displayed) Labs Reviewed  COMPREHENSIVE METABOLIC PANEL - Abnormal; Notable for the following components:      Result Value   Glucose, Bld 69 (*)    Calcium 8.7 (*)    All other components within normal limits  URINALYSIS, ROUTINE W REFLEX MICROSCOPIC - Abnormal; Notable for the following components:   Color, Urine RED (*)    APPearance CLOUDY (*)    Hgb urine dipstick LARGE (*)    Protein, ur 100 (*)    RBC / HPF >50 (*)    Bacteria, UA RARE (*)    All other components within normal limits  LIPASE, BLOOD  CBC  I-STAT BETA HCG BLOOD, ED ([facility], [facility], [facility] ONLY)    EKG None  Radiology CT ABDOMEN PELVIS W CONTRAST  Result Date: 04/15/2021 CLINICAL DATA:  Right lower quadrant abdominal pain EXAM: CT ABDOMEN AND PELVIS WITH CONTRAST TECHNIQUE: Multidetector CT imaging of the abdomen and pelvis was performed using the  standard protocol following bolus administration of intravenous contrast. CONTRAST:  OMNIPAQUE IOHEXOL 300 MG/ML  SOLN COMPARISON:  CT pelvis dated 01/05/2017. CT abdomen/pelvis dated 04/21/2014. FINDINGS: Lower chest: Lung bases are clear. Hepatobiliary: Liver is within normal limits. Status post cholecystectomy. No intrahepatic or extrahepatic ductal dilatation. Pancreas: Within normal limits. Spleen: Within normal limits. Adrenals/Urinary Tract: Adrenal glands are within normal limits. Punctate interpolar right renal calculus (coronal image 82). Kidneys are otherwise within normal limits. No hydronephrosis. Bladder  is underdistended but unremarkable. Stomach/Bowel: Stomach is within normal limits. No evidence of bowel obstruction. Normal appendix (series 2/image 56). No colonic wall thickening or inflammatory changes. Vascular/Lymphatic: No evidence of abdominal aortic aneurysm. No suspicious abdominopelvic lymphadenopathy. Reproductive: Anterior position of the uterus suggests prior C-section. Bilateral ovaries are within normal limits. Other: No abdominopelvic ascites. Musculoskeletal: Visualized osseous structures are within normal limits. IMPRESSION: Punctate interpolar right renal calculus. Otherwise negative CT abdomen/pelvis.  Normal appendix. Status post cholecystectomy. Electronically Signed   By: Charline BillsSriyesh  Krishnan M.D.   On: 04/15/2021 19:06    Procedures Procedures   Medications Ordered in ED Medications  ondansetron (ZOFRAN-ODT) disintegrating tablet 4 mg (4 mg Oral Given 04/15/21 1618)  acetaminophen (TYLENOL) tablet 650 mg (650 mg Oral Given 04/15/21 1619)  dicyclomine (BENTYL) injection 20 mg (20 mg Intramuscular Given 04/15/21 1757)  iohexol (OMNIPAQUE) 300 MG/ML solution 100 mL (100 mLs Intravenous Contrast Given 04/15/21 1849)  ketorolac (TORADOL) 30 MG/ML injection 30 mg (30 mg Intravenous Given 04/15/21 1914)    ED Course  I have reviewed the triage vital signs and the nursing  notes.  Pertinent labs & imaging results that were available during my care of the patient were reviewed by me and considered in my medical decision making (see chart for details).  Clinical Course as of 04/15/21 2201  Mon Apr 15, 2021  1740 WBC: 6.9 [HK]  1740 I-stat hCG, quantitative: <5.0 [HK]  1740 Creatinine: 0.75 [HK]  1740 RBC / HPF(!): >50 [HK]  1829 Bacteria, UA(!): RARE [HK]  1829 WBC, UA: 0-5 [HK]    Clinical Course User Index [HK] Dietrich PatesKhatri, Diedra Sinor, PA-C   MDM Rules/Calculators/A&P                          35 year old female presenting to the ED for right lower quadrant pain since this morning.  Reports associated nausea and diarrhea.  Prior abdominal surgeries include cholecystectomy.  She started her menstrual cycle today but states that this feels different than her menstrual cramps.  On exam there is tenderness to palpation of the right lower quadrant without rebound or guarding.  Vital signs within normal limits.  Lab work including CBC, CMP unremarkable.  hCG is negative.  Will obtain CT of abdomen pelvis to rule out ovarian pathology versus appendicitis.  Will attempt pain control in the meantime.  CT of the abdomen pelvis shows small interpolar right renal calculus without any other abnormalities.  No signs of obstruction.  No signs of appendicitis or ovarian cyst.  I suspect that her symptoms could be due to dysmenorrhea.  Will treat with NSAIDs and give Zofran as needed to help with nausea.  Pain controlled here.  Able to tolerate p.o. intake.  Return precautions given.   Patient is hemodynamically stable, in NAD, and able to ambulate in the ED. Evaluation does not show pathology that would require ongoing emergent intervention or inpatient treatment. I explained the diagnosis to the patient. Pain has been managed and has no complaints prior to discharge. Patient is comfortable with above plan and is stable for discharge at this time. All questions were answered prior to  disposition. Strict return precautions for returning to the ED were discussed. Encouraged follow up with PCP.   An After Visit Summary was printed and given to the patient.   Portions of this note were generated with Scientist, clinical (histocompatibility and immunogenetics)Dragon dictation software. Dictation errors may occur despite best attempts at proofreading.  Final Clinical Impression(s) / ED  Diagnoses Final diagnoses:  Nephrolithiasis  Dysmenorrhea    Rx / DC Orders ED Discharge Orders          Ordered    naproxen (NAPROSYN) 500 MG tablet  2 times daily        04/15/21 1959    ondansetron (ZOFRAN ODT) 4 MG disintegrating tablet  Every 8 hours PRN        04/15/21 1959             Dietrich Pates, PA-C 04/15/21 2201    Terrilee Files, MD 04/16/21 1146

## 2021-04-15 NOTE — Discharge Instructions (Addendum)
Take medication stop with your symptoms. Follow-up with your primary care provider. Return to the ER if you start to experience worsening pain, fever, vomiting.

## 2021-04-15 NOTE — ED Notes (Signed)
Labeled specimen cup provided to pt for urine collection per MD order. ENMiles 

## 2021-09-12 ENCOUNTER — Emergency Department (HOSPITAL_BASED_OUTPATIENT_CLINIC_OR_DEPARTMENT_OTHER)
Admission: EM | Admit: 2021-09-12 | Discharge: 2021-09-12 | Disposition: A | Discharge status: Home / Self Care | Payer: Self-pay | Attending: Emergency Medicine | Admitting: Emergency Medicine

## 2021-09-12 ENCOUNTER — Encounter (HOSPITAL_BASED_OUTPATIENT_CLINIC_OR_DEPARTMENT_OTHER): Payer: Self-pay | Admitting: Emergency Medicine

## 2021-09-12 ENCOUNTER — Other Ambulatory Visit: Payer: Self-pay

## 2021-09-12 ENCOUNTER — Emergency Department (HOSPITAL_BASED_OUTPATIENT_CLINIC_OR_DEPARTMENT_OTHER): Payer: Self-pay

## 2021-09-12 DIAGNOSIS — X58XXXA Exposure to other specified factors, initial encounter: Secondary | ICD-10-CM | POA: Insufficient documentation

## 2021-09-12 DIAGNOSIS — M25531 Pain in right wrist: Secondary | ICD-10-CM | POA: Insufficient documentation

## 2021-09-12 DIAGNOSIS — S63501A Unspecified sprain of right wrist, initial encounter: Secondary | ICD-10-CM | POA: Insufficient documentation

## 2021-09-12 DIAGNOSIS — Y9389 Activity, other specified: Secondary | ICD-10-CM | POA: Insufficient documentation

## 2021-09-12 MED ORDER — IBUPROFEN 400 MG PO TABS
600.0000 mg | ORAL_TABLET | Freq: Once | ORAL | Status: AC
  Administered 2021-09-12: 600 mg via ORAL
  Filled 2021-09-12: qty 1

## 2021-09-12 NOTE — ED Triage Notes (Signed)
Rt wrist pain  x 5 days , had an injury  and she thought it would heal but it hurts to move throbbing pain sharp pain she states, good pulse and can wiggle fingers and < 3 sec cap refill

## 2021-09-12 NOTE — Discharge Instructions (Signed)
Recommend Tylenol or Motrin for pain control.  Follow-up with your primary doctor or with sports medicine/Ortho.

## 2021-09-12 NOTE — ED Notes (Signed)
AVS provided to and discussed with patient. Pt verbalizes understanding of discharge instructions and denies any questions or concerns at this time. Pt ambulated out of department independently with steady gait.  

## 2021-09-12 NOTE — ED Provider Notes (Signed)
[facility] EMERGENCY DEPARTMENT Provider Note   CSN: [id number] Arrival date & time: 09/12/21  1247     History Chief Complaint  Patient presents with   Wrist Pain    Karen Warren is a 35 y.o. female.  Presents to ER with concern for wrist pain.  Had injury to wrist while playing with her daughter 5 days ago.  Has had relatively constant pain ever since.  Worse with movement improved with rest.  No numbness or weakness.  Has tried some over-the-counter meds with minimal relief.  Denies any chronic medical problems.  HPI     Past Medical History:  Diagnosis Date   Renal disorder    kidney infection    Patient Active Problem List   Diagnosis Date Noted   Acute pyelonephritis 04/21/2014   Sepsis (HCC) 04/21/2014   Diarrhea 06/09/2012   Leukocytosis 06/07/2012   Anemia 06/07/2012   Pyelonephritis 06/06/2012   Abdominal pain 06/06/2012   Pain, dental 06/06/2012   Fever 06/06/2012   Hypokalemia 06/06/2012    Past Surgical History:  Procedure Laterality Date   CESAREAN SECTION     CHOLECYSTECTOMY     TUBAL LIGATION     WISDOM TOOTH EXTRACTION       OB History   No obstetric history on file.     Family History  Problem Relation Age of Onset   Hypertension Mother    Hypertension Maternal Grandmother    Diabetes Maternal Grandmother     Social History   Tobacco Use   Smoking status: Every Day    Packs/day: 0.30    Types: Cigarettes   Smokeless tobacco: Never  Vaping Use   Vaping Use: Never used  Substance Use Topics   Alcohol use: Yes   Drug use: No    Home Medications Prior to Admission medications   Medication Sig Start Date End Date Taking? Authorizing Provider  bacitracin ointment Apply 1 application topically 2 (two) times daily. 01/03/18   Rise Mu, PA-C  fluticasone (FLONASE) 50 MCG/ACT nasal spray Place 1 spray into both nostrils daily. Patient taking differently: Place 1 spray into both nostrils daily as needed  for allergies.  11/29/16   Audry Pili, PA-C  HYDROcodone-acetaminophen (NORCO/VICODIN) 5-325 MG tablet Take 1 tablet by mouth every 4 (four) hours as needed. 12/24/19   Jacalyn Lefevre, MD  ibuprofen (ADVIL) 600 MG tablet Take 1 tablet (600 mg total) by mouth every 6 (six) hours as needed. 12/24/19   Jacalyn Lefevre, MD  naproxen (NAPROSYN) 500 MG tablet Take 1 tablet (500 mg total) by mouth 2 (two) times daily. 04/15/21   Khatri, Hina, PA-C  ondansetron (ZOFRAN ODT) 4 MG disintegrating tablet Take 1 tablet (4 mg total) by mouth every 8 (eight) hours as needed for nausea or vomiting. 04/15/21   Dietrich Pates, PA-C    Allergies    Patient has no known allergies.  Review of Systems   Review of Systems  Musculoskeletal:  Positive for arthralgias.  All other systems reviewed and are negative.  Physical Exam Updated Vital Signs BP 121/62 (BP Location: Left Arm)   Pulse 81   Temp 98.6 F (37 C) (Oral)   Resp 17   Ht 5\' 3"  (1.6 m)   Wt 83.9 kg   SpO2 100%   BMI 32.77 kg/m   Physical Exam Vitals and nursing note reviewed.  Constitutional:      General: She is not in acute distress.    Appearance: She is well-developed.  HENT:     Head: Normocephalic and atraumatic.  Eyes:     Conjunctiva/sclera: Conjunctivae normal.  Cardiovascular:     Rate and Rhythm: Normal rate.     Pulses: Normal pulses.  Pulmonary:     Effort: Pulmonary effort is normal. No respiratory distress.  Musculoskeletal:        General: No swelling.     Cervical back: Neck supple.     Comments: Right upper extremity: There is tenderness to the wrist, no tenderness to the hand or elbow, normal radial pulse, normal sensation and motor distally  Skin:    General: Skin is warm and dry.     Capillary Refill: Capillary refill takes less than 2 seconds.  Neurological:     General: No focal deficit present.     Mental Status: She is alert.  Psychiatric:        Mood and Affect: Mood normal.    ED Results / Procedures /  Treatments   Labs (all labs ordered are listed, but only abnormal results are displayed) Labs Reviewed - No data to display  EKG None  Radiology DG Wrist Complete Right  Result Date: 09/12/2021 CLINICAL DATA:  Right wrist pain EXAM: RIGHT WRIST - COMPLETE 3+ VIEW COMPARISON:  Wrist radiograph 06/12/2014. FINDINGS: There is no evidence of acute fracture or dislocation. There is negative ulnar variance. No significant arthritis. IMPRESSION: No acute osseous abnormality. Electronically Signed   By: Caprice Renshaw M.D.   On: 09/12/2021 13:31    Procedures Procedures   Medications Ordered in ED Medications  ibuprofen (ADVIL) tablet 600 mg (600 mg Oral Given 09/12/21 1320)    ED Course  I have reviewed the triage vital signs and the nursing notes.  Pertinent labs & imaging results that were available during my care of the patient were reviewed by me and considered in my medical decision making (see chart for details).    MDM Rules/Calculators/A&P                           35 year old lady presents to ER with concern for right wrist injury.  Denied other associated injuries.  On exam some tenderness noted to the wrist but no obvious deformity.  Neurovascularly intact.  X-ray negative.  Suspect MSK strain.  Provided wrist brace, recommend follow-up with primary doctor or sports medicine.  After the discussed management above, the patient was determined to be safe for discharge.  The patient was in agreement with this plan and all questions regarding their care were answered.  ED return precautions were discussed and the patient will return to the ED with any significant worsening of condition.   Final Clinical Impression(s) / ED Diagnoses Final diagnoses:  Sprain of right wrist, initial encounter    Rx / DC Orders ED Discharge Orders     None        Milagros Loll, MD 09/13/21 [phone]

## 2021-10-10 ENCOUNTER — Ambulatory Visit (HOSPITAL_COMMUNITY): Payer: No Payment, Other | Admitting: Licensed Clinical Social Worker

## 2022-02-01 ENCOUNTER — Emergency Department (HOSPITAL_BASED_OUTPATIENT_CLINIC_OR_DEPARTMENT_OTHER)
Admission: EM | Admit: 2022-02-01 | Discharge: 2022-02-01 | Disposition: A | Discharge status: Home / Self Care | Payer: Self-pay | Attending: Emergency Medicine | Admitting: Emergency Medicine

## 2022-02-01 ENCOUNTER — Encounter (HOSPITAL_BASED_OUTPATIENT_CLINIC_OR_DEPARTMENT_OTHER): Payer: Self-pay | Admitting: Emergency Medicine

## 2022-02-01 ENCOUNTER — Other Ambulatory Visit: Payer: Self-pay

## 2022-02-01 ENCOUNTER — Emergency Department (HOSPITAL_BASED_OUTPATIENT_CLINIC_OR_DEPARTMENT_OTHER): Payer: Self-pay

## 2022-02-01 DIAGNOSIS — X503XXA Overexertion from repetitive movements, initial encounter: Secondary | ICD-10-CM | POA: Insufficient documentation

## 2022-02-01 DIAGNOSIS — S9031XA Contusion of right foot, initial encounter: Secondary | ICD-10-CM | POA: Insufficient documentation

## 2022-02-01 MED ORDER — METHYLPREDNISOLONE 4 MG PO TBPK: ORAL_TABLET | ORAL | 0 refills | Status: DC

## 2022-02-01 NOTE — ED Triage Notes (Signed)
Rt foot pain x 1 week hurts to walk  top of foot  ?

## 2022-02-01 NOTE — ED Provider Notes (Signed)
?[facility] EMERGENCY DEPARTMENT ?Provider Note ? ? ?CSN: [id number] ?Arrival date & time: 02/01/22  1156 ? ?  ? ?History ? ?Chief Complaint  ?Patient presents with  ? Foot Pain  ? ? ?Karen Warren is a 36 y.o. female. ? ?Pain to the bottom of her right foot for the last week.  Worse with ambulation.  Does a lot of walking at work.  Denies any fevers or chills.  Denies any trauma.  No history of gout or other significant medical history.  Over-the-counter medications have helped somewhat.  Walking makes it worse.  Rest makes it better. ? ?The history is provided by the patient.  ? ?  ? ?Home Medications ?Prior to Admission medications   ?Medication Sig Start Date End Date Taking? Authorizing Provider  ?methylPREDNISolone (MEDROL DOSEPAK) 4 MG TBPK tablet Follow package insert 02/01/22  Yes Valetta Mulroy, DO  ?bacitracin ointment Apply 1 application topically 2 (two) times daily. 01/03/18   Rise Mu, PA-C  ?fluticasone (FLONASE) 50 MCG/ACT nasal spray Place 1 spray into both nostrils daily. ?Patient taking differently: Place 1 spray into both nostrils daily as needed for allergies.  11/29/16   Audry Pili, PA-C  ?HYDROcodone-acetaminophen (NORCO/VICODIN) 5-325 MG tablet Take 1 tablet by mouth every 4 (four) hours as needed. 12/24/19   Jacalyn Lefevre, MD  ?ibuprofen (ADVIL) 600 MG tablet Take 1 tablet (600 mg total) by mouth every 6 (six) hours as needed. 12/24/19   Jacalyn Lefevre, MD  ?naproxen (NAPROSYN) 500 MG tablet Take 1 tablet (500 mg total) by mouth 2 (two) times daily. 04/15/21   Khatri, Hina, PA-C  ?ondansetron (ZOFRAN ODT) 4 MG disintegrating tablet Take 1 tablet (4 mg total) by mouth every 8 (eight) hours as needed for nausea or vomiting. 04/15/21   Dietrich Pates, PA-C  ?   ? ?Allergies    ?Patient has no known allergies.   ? ?Review of Systems   ?Review of Systems ? ?Physical Exam ?Updated Vital Signs ?BP 128/68   Pulse 80   Temp 98.1 ?F (36.7 ?C) (Oral)   Resp 18   Ht 5\' 2"  (1.575 m)    Wt 86.2 kg   LMP  (LMP Unknown)   SpO2 100%   BMI 34.75 kg/m?  ?Physical Exam ?Constitutional:   ?   General: She is not in acute distress. ?   Appearance: She is not ill-appearing.  ?Cardiovascular:  ?   Pulses: Normal pulses.  ?Musculoskeletal:     ?   General: Tenderness present. No swelling. Normal range of motion.  ?   Comments: Tenderness to the mid part of the right foot over the dorsum and plantar surfaces.  No swelling of the foot, no erythema, no warmth, no calf tenderness bilaterally  ?Skin: ?   General: Skin is warm.  ?   Capillary Refill: Capillary refill takes less than 2 seconds.  ?   Findings: No erythema.  ?Neurological:  ?   Mental Status: She is alert.  ?   Sensory: No sensory deficit.  ?   Motor: No weakness.  ? ? ?ED Results / Procedures / Treatments   ?Labs ?(all labs ordered are listed, but only abnormal results are displayed) ?Labs Reviewed - No data to display ? ?EKG ?None ? ?Radiology ?DG Foot Complete Right ? ?Result Date: 02/01/2022 ?CLINICAL DATA:  Acute RIGHT foot pain for 1 week. No known injury. Initial encounter. EXAM: RIGHT FOOT COMPLETE - 3+ VIEW COMPARISON:  None. FINDINGS: There is no  evidence of fracture or dislocation. There is no evidence of arthropathy or other focal bone abnormality. Soft tissues are unremarkable. IMPRESSION: Negative. Electronically Signed   By: Harmon Pier M.D.   On: 02/01/2022 12:33   ? ?Procedures ?Procedures  ? ? ?Medications Ordered in ED ?Medications - No data to display ? ?ED Course/ Medical Decision Making/ A&P ?  ?                        ?Medical Decision Making ?Amount and/or Complexity of Data Reviewed ?Radiology: ordered. ? ?Risk ?Prescription drug management. ? ? ?Shirlee Latch is here with right foot pain.  Normal vitals.  No fever.  Neurovascular neuromuscular intact on exam.  Good pulses.  No swelling, no erythema, no warmth.  Tenderness to the midfoot both on the dorsal and plantar surfaces.  Differential diagnosis is likely  inflammatory/overuse process.  Have no concern for gout or infectious process.  X-ray was obtained and showed no evidence of fracture or dislocation.  Overall suspect musculoskeletal process.  Recommend Tylenol, ice, rest.  Will do a short course of steroids and have her follow-up with sports medicine as she does not have a primary care doctor.  Discharged in good condition. ? ?This chart was dictated using voice recognition software.  Despite best efforts to proofread,  errors can occur which can change the documentation meaning.  ? ? ? ? ? ? ? ?Final Clinical Impression(s) / ED Diagnoses ?Final diagnoses:  ?Contusion of right foot, initial encounter  ? ? ?Rx / DC Orders ?ED Discharge Orders   ? ?      Ordered  ?  methylPREDNISolone (MEDROL DOSEPAK) 4 MG TBPK tablet       ? 02/01/22 1238  ? ?  ?  ? ?  ? ? ?  ?Virgina Norfolk, DO ?02/01/22 1238 ? ?

## 2022-02-01 NOTE — Discharge Instructions (Addendum)
X-ray does not show any fracture or dislocation.  Overall suspect that this is an inflammatory process.  Likely will resolve on its own.  Recommend 1000 mg of Tylenol every 6 hours as needed for pain.  Follow-up with sports medicine if needed.  I have prescribed you steroids to help with your discomfort.  Follow instructions on steroid Dosepak. ?

## 2022-02-01 NOTE — ED Notes (Signed)
ED Provider at bedside. 

## 2022-04-08 ENCOUNTER — Emergency Department (HOSPITAL_BASED_OUTPATIENT_CLINIC_OR_DEPARTMENT_OTHER)
Admission: EM | Admit: 2022-04-08 | Discharge: 2022-04-08 | Disposition: A | Discharge status: Home / Self Care | Payer: Self-pay | Attending: Emergency Medicine | Admitting: Emergency Medicine

## 2022-04-08 ENCOUNTER — Encounter (HOSPITAL_BASED_OUTPATIENT_CLINIC_OR_DEPARTMENT_OTHER): Payer: Self-pay

## 2022-04-08 ENCOUNTER — Other Ambulatory Visit: Payer: Self-pay

## 2022-04-08 DIAGNOSIS — T300 Burn of unspecified body region, unspecified degree: Secondary | ICD-10-CM

## 2022-04-08 DIAGNOSIS — T22131A Burn of first degree of right upper arm, initial encounter: Secondary | ICD-10-CM | POA: Insufficient documentation

## 2022-04-08 DIAGNOSIS — X19XXXA Contact with other heat and hot substances, initial encounter: Secondary | ICD-10-CM | POA: Insufficient documentation

## 2022-04-08 MED ORDER — BACITRACIN ZINC 500 UNIT/GM EX OINT
TOPICAL_OINTMENT | Freq: Two times a day (BID) | CUTANEOUS | Status: DC
  Filled 2022-04-08: qty 28.35

## 2022-04-08 MED ORDER — IBUPROFEN 800 MG PO TABS
800.0000 mg | ORAL_TABLET | Freq: Once | ORAL | Status: AC
  Administered 2022-04-08: 800 mg via ORAL
  Filled 2022-04-08: qty 1

## 2022-04-08 NOTE — ED Notes (Signed)
Pt. Has c/o R arm pain and reports she had a "meat transfer" stuck to her arm at work on the underside of her arm.  No noted redness or blisters or discoloration noted.  No broken skin noted.  Pt. States "it hurts" and has a wet cloth lying beside her.

## 2022-05-19 ENCOUNTER — Emergency Department (HOSPITAL_COMMUNITY): Payer: Self-pay

## 2022-05-19 ENCOUNTER — Emergency Department (HOSPITAL_COMMUNITY)
Admission: EM | Admit: 2022-05-19 | Discharge: 2022-05-20 | Disposition: A | Discharge status: Home / Self Care | Payer: Self-pay | Attending: Emergency Medicine | Admitting: Emergency Medicine

## 2022-05-19 ENCOUNTER — Encounter (HOSPITAL_COMMUNITY): Payer: Self-pay | Admitting: Emergency Medicine

## 2022-05-19 DIAGNOSIS — R109 Unspecified abdominal pain: Secondary | ICD-10-CM | POA: Insufficient documentation

## 2022-05-19 DIAGNOSIS — S29012A Strain of muscle and tendon of back wall of thorax, initial encounter: Secondary | ICD-10-CM

## 2022-05-19 DIAGNOSIS — M549 Dorsalgia, unspecified: Secondary | ICD-10-CM | POA: Insufficient documentation

## 2022-05-19 LAB — CBC WITH DIFFERENTIAL/PLATELET
Abs Immature Granulocytes: 0.01 10*3/uL (ref 0.00–0.07)
Basophils Absolute: 0 10*3/uL (ref 0.0–0.1)
Basophils Relative: 0 %
Eosinophils Absolute: 0 10*3/uL (ref 0.0–0.5)
Eosinophils Relative: 1 %
HCT: 35.5 % — ABNORMAL LOW (ref 36.0–46.0)
Hemoglobin: 11.6 g/dL — ABNORMAL LOW (ref 12.0–15.0)
Immature Granulocytes: 0 %
Lymphocytes Relative: 49 %
Lymphs Abs: 2.9 10*3/uL (ref 0.7–4.0)
MCH: 29.4 pg (ref 26.0–34.0)
MCHC: 32.7 g/dL (ref 30.0–36.0)
MCV: 89.9 fL (ref 80.0–100.0)
Monocytes Absolute: 0.3 10*3/uL (ref 0.1–1.0)
Monocytes Relative: 5 %
Neutro Abs: 2.7 10*3/uL (ref 1.7–7.7)
Neutrophils Relative %: 45 %
Platelets: 311 10*3/uL (ref 150–400)
RBC: 3.95 MIL/uL (ref 3.87–5.11)
RDW: 14.1 % (ref 11.5–15.5)
WBC: 6 10*3/uL (ref 4.0–10.5)
nRBC: 0 % (ref 0.0–0.2)

## 2022-05-19 LAB — COMPREHENSIVE METABOLIC PANEL
ALT: 31 U/L (ref 0–44)
AST: 34 U/L (ref 15–41)
Albumin: 4.2 g/dL (ref 3.5–5.0)
Alkaline Phosphatase: 45 U/L (ref 38–126)
Anion gap: 8 (ref 5–15)
BUN: 11 mg/dL (ref 6–20)
CO2: 21 mmol/L — ABNORMAL LOW (ref 22–32)
Calcium: 9.1 mg/dL (ref 8.9–10.3)
Chloride: 110 mmol/L (ref 98–111)
Creatinine, Ser: 0.97 mg/dL (ref 0.44–1.00)
GFR, Estimated: >60 mL/min (ref >60)
Glucose, Bld: 92 mg/dL (ref 70–99)
Potassium: 3.5 mmol/L (ref 3.5–5.1)
Sodium: 139 mmol/L (ref 135–145)
Total Bilirubin: 0.3 mg/dL (ref 0.3–1.2)
Total Protein: 6.9 g/dL (ref 6.5–8.1)

## 2022-05-19 LAB — PREGNANCY, URINE: Preg Test, Ur: NEGATIVE

## 2022-05-19 LAB — URINALYSIS, ROUTINE W REFLEX MICROSCOPIC
Bilirubin Urine: NEGATIVE
Glucose, UA: NEGATIVE mg/dL
Hgb urine dipstick: NEGATIVE
Ketones, ur: NEGATIVE mg/dL
Leukocytes,Ua: NEGATIVE
Nitrite: NEGATIVE
Protein, ur: NEGATIVE mg/dL
Specific Gravity, Urine: 1.02 (ref 1.005–1.030)
pH: 6 (ref 5.0–8.0)

## 2022-05-19 LAB — D-DIMER, QUANTITATIVE: D-Dimer, Quant: <0.27 ug/mL-FEU (ref 0.00–0.50)

## 2022-05-19 MED ORDER — LIDOCAINE 5 % EX PTCH
1.0000 | MEDICATED_PATCH | CUTANEOUS | Status: DC
  Administered 2022-05-19: 1 via TRANSDERMAL
  Filled 2022-05-19: qty 1

## 2022-05-19 MED ORDER — SODIUM CHLORIDE (PF) 0.9 % IJ SOLN
INTRAMUSCULAR | Status: AC
  Filled 2022-05-19: qty 50

## 2022-05-19 MED ORDER — KETOROLAC TROMETHAMINE 15 MG/ML IJ SOLN
15.0000 mg | Freq: Once | INTRAMUSCULAR | Status: AC
  Administered 2022-05-19: 15 mg via INTRAVENOUS
  Filled 2022-05-19: qty 1

## 2022-05-19 MED ORDER — OXYCODONE-ACETAMINOPHEN 5-325 MG PO TABS
1.0000 | ORAL_TABLET | Freq: Once | ORAL | Status: AC
  Administered 2022-05-19: 1 via ORAL
  Filled 2022-05-19: qty 1

## 2022-05-19 NOTE — ED Triage Notes (Signed)
Pt endorses upper back pain. States she can't lay down or do anything. Denies any urinary pain or frequency.

## 2022-05-19 NOTE — ED Provider Notes (Signed)
[facility]-EMERGENCY DEPT Provider Note   CSN: [id number] Arrival date & time: 05/19/22  6283     History  Chief Complaint  Patient presents with   Back Pain    Karen Warren is a 36 y.o. female.  Moving and breathing makes it worse. Started yesterday. Very sharp pain that is constant. 10/10. Nausea without vomiting. No dysuria or frequency. No CP. Does have SOB 2/2 pain with breathing. No weakness or numbness anywhere. Has tried heating pads, water, tylenol, and cranberry juice. No leg swelling or history DVT/PE. No hx of cancer. No recent surgery. Denies fevers.    Back Pain  Past Medical History:  Diagnosis Date   Renal disorder    kidney infection       Home Medications Prior to Admission medications   Medication Sig Start Date End Date Taking? Authorizing Provider  fluticasone (FLONASE) 50 MCG/ACT nasal spray Place 1 spray into both nostrils daily. Patient not taking: Reported on 05/19/2022 11/29/16   Audry Pili, PA-C      Allergies    Patient has no known allergies.    Review of Systems   Review of Systems  Musculoskeletal:  Positive for back pain.    Physical Exam Updated Vital Signs BP 126/81   Pulse 87   Temp 98.4 F (36.9 C) (Oral)   Resp (!) 21   Ht 5\' 2"  (1.575 m)   Wt 86 kg   LMP 05/09/2022 (Approximate)   SpO2 97%   BMI 34.68 kg/m  Physical Exam  ED Results / Procedures / Treatments   Labs (all labs ordered are listed, but only abnormal results are displayed) Labs Reviewed  COMPREHENSIVE METABOLIC PANEL - Abnormal; Notable for the following components:      Result Value   CO2 21 (*)    All other components within normal limits  CBC WITH DIFFERENTIAL/PLATELET - Abnormal; Notable for the following components:   Hemoglobin 11.6 (*)    HCT 35.5 (*)    All other components within normal limits  URINALYSIS, ROUTINE W REFLEX MICROSCOPIC - Abnormal; Notable for the following components:   APPearance HAZY (*)    All  other components within normal limits  URINE CULTURE  PREGNANCY, URINE    EKG None  Radiology CT Renal Stone Study  Result Date: 05/19/2022 CLINICAL DATA:  Flank pain, kidney stone suspected EXAM: CT ABDOMEN AND PELVIS WITHOUT CONTRAST TECHNIQUE: Multidetector CT imaging of the abdomen and pelvis was performed following the standard protocol without IV contrast. RADIATION DOSE REDUCTION: This exam was performed according to the departmental dose-optimization program which includes automated exposure control, adjustment of the mA and/or kV according to patient size and/or use of iterative reconstruction technique. COMPARISON:  None Available. FINDINGS: Lower chest: No acute abnormality. Hepatobiliary: No focal liver abnormality. Status post cholecystectomy. No biliary dilatation. Pancreas: No focal lesion. Normal pancreatic contour. No surrounding inflammatory changes. No main pancreatic ductal dilatation. Spleen: Normal in size without focal abnormality. Adrenals/Urinary Tract: No adrenal nodule bilaterally. No nephrolithiasis and no hydronephrosis. No definite contour-deforming renal mass. No ureterolithiasis or hydroureter. The urinary bladder is unremarkable. Stomach/Bowel: Stomach is within normal limits. No evidence of bowel wall thickening or dilatation. Appendix appears normal. Vascular/Lymphatic: No abdominal aorta or iliac aneurysm. Mild atherosclerotic plaque of the aorta and its branches. No abdominal, pelvic, or inguinal lymphadenopathy. Reproductive: Uterus and bilateral adnexa are unremarkable. Left ovary corpus luteum cyst. Other: No intraperitoneal free fluid. No intraperitoneal free gas. No organized fluid collection. Musculoskeletal:  No abdominal wall hernia or abnormality. No suspicious lytic or blastic osseous lesions. No acute displaced fracture. Multilevel degenerative changes of the spine. IMPRESSION: 1. No acute intra-abdominal or intrapelvic abnormality on this noncontrast study.  2.  Aortic Atherosclerosis (ICD10-I70.0). Electronically Signed   By: Tish Frederickson M.D.   On: 05/19/2022 20:14   DG Ribs Unilateral W/Chest Left  Result Date: 05/19/2022 CLINICAL DATA:  Posterior rib pain EXAM: LEFT RIBS AND CHEST - 3 VIEW COMPARISON:  None Available. FINDINGS: No fracture or other bone lesions are seen involving the ribs. There is no evidence of pneumothorax or pleural effusion. Calcified granuloma versus prominent vessel seen in the mid left lung. Both lungs are otherwise clear. Heart size and mediastinal contours are within normal limits. IMPRESSION: Negative. Electronically Signed   By: Allegra Lai M.D.   On: 05/19/2022 20:09    Procedures Procedures  {Document cardiac monitor, telemetry assessment procedure when appropriate:1}  Medications Ordered in ED Medications  oxyCODONE-acetaminophen (PERCOCET/ROXICET) 5-325 MG per tablet 1 tablet (1 tablet Oral Given 05/19/22 1910)    ED Course/ Medical Decision Making/ A&P                           Medical Decision Making  ***  {Document critical care time when appropriate:1} {Document review of labs and clinical decision tools ie heart score, Chads2Vasc2 etc:1}  {Document your independent review of radiology images, and any outside records:1} {Document your discussion with family members, caretakers, and with consultants:1} {Document social determinants of health affecting pt's care:1} {Document your decision making why or why not admission, treatments were needed:1} Final Clinical Impression(s) / ED Diagnoses Final diagnoses:  None    Rx / DC Orders ED Discharge Orders     None

## 2022-05-20 MED ORDER — IOHEXOL 300 MG/ML  SOLN
100.0000 mL | Freq: Once | INTRAMUSCULAR | Status: AC | PRN
  Administered 2022-05-20: 100 mL via INTRAVENOUS

## 2022-05-20 MED ORDER — OXYCODONE HCL 5 MG PO TABS
5.0000 mg | ORAL_TABLET | ORAL | Status: AC
  Administered 2022-05-20: 5 mg via ORAL
  Filled 2022-05-20: qty 1

## 2022-05-20 MED ORDER — OXYCODONE HCL 5 MG PO TABS: 5.0000 mg | ORAL_TABLET | Freq: Four times a day (QID) | ORAL | 0 refills | Status: DC | PRN

## 2022-05-20 MED ORDER — DICLOFENAC SODIUM 1 % EX GEL: 2.0000 g | Freq: Four times a day (QID) | CUTANEOUS | 0 refills | Status: AC

## 2022-05-20 MED ORDER — HYDROCODONE-ACETAMINOPHEN 5-325 MG PO TABS
1.0000 | ORAL_TABLET | Freq: Once | ORAL | Status: AC
  Administered 2022-05-20: 1 via ORAL
  Filled 2022-05-20: qty 1

## 2022-05-20 MED ORDER — MORPHINE SULFATE (PF) 4 MG/ML IV SOLN: 4.0000 mg | Freq: Once | INTRAVENOUS | Status: DC

## 2022-05-20 MED ORDER — ACETAMINOPHEN 500 MG PO TABS: 1000.0000 mg | ORAL_TABLET | Freq: Three times a day (TID) | ORAL | 0 refills | Status: AC

## 2022-05-21 LAB — URINE CULTURE

## 2022-07-29 IMAGING — DX DG WRIST COMPLETE 3+V*R*
4 series · 4 of 4 positions shown · non-contrast
Comparison: Wrist radiograph 06/12/2014.

CLINICAL DATA: Right wrist pain

EXAM:
RIGHT WRIST - COMPLETE 3+ VIEW

[wrist pa]
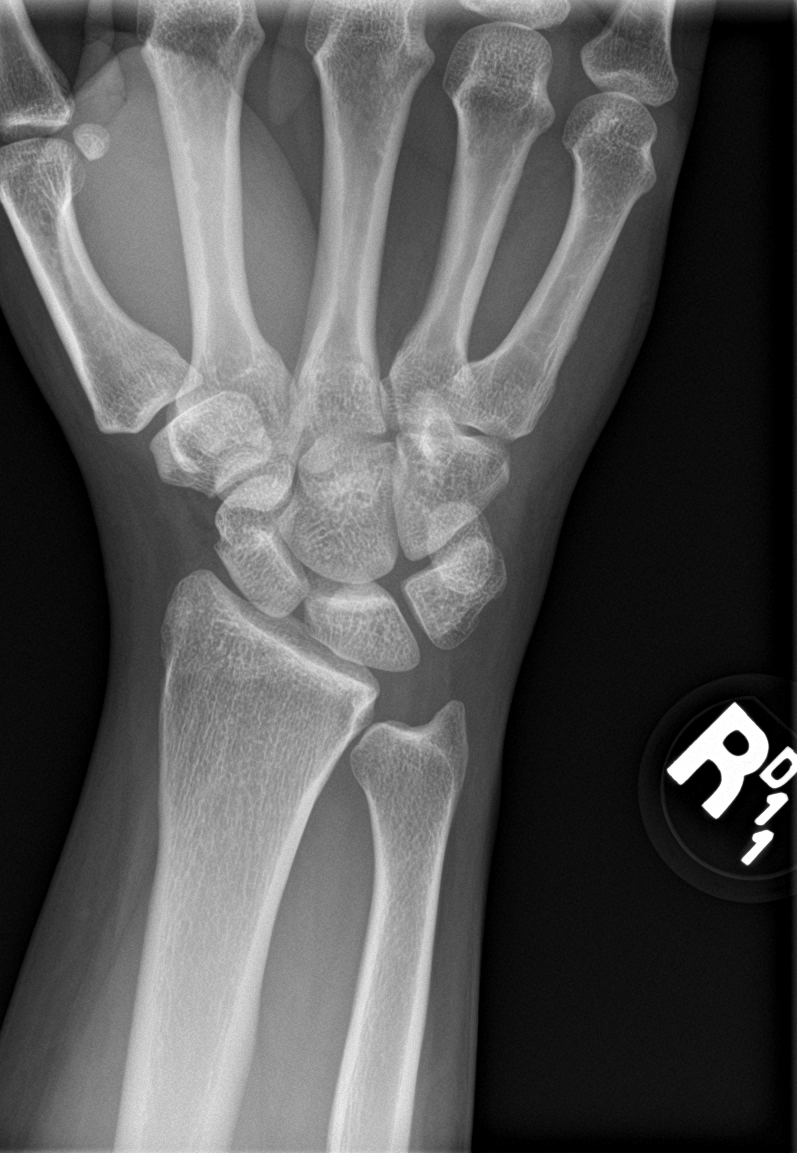

[wrist obl]
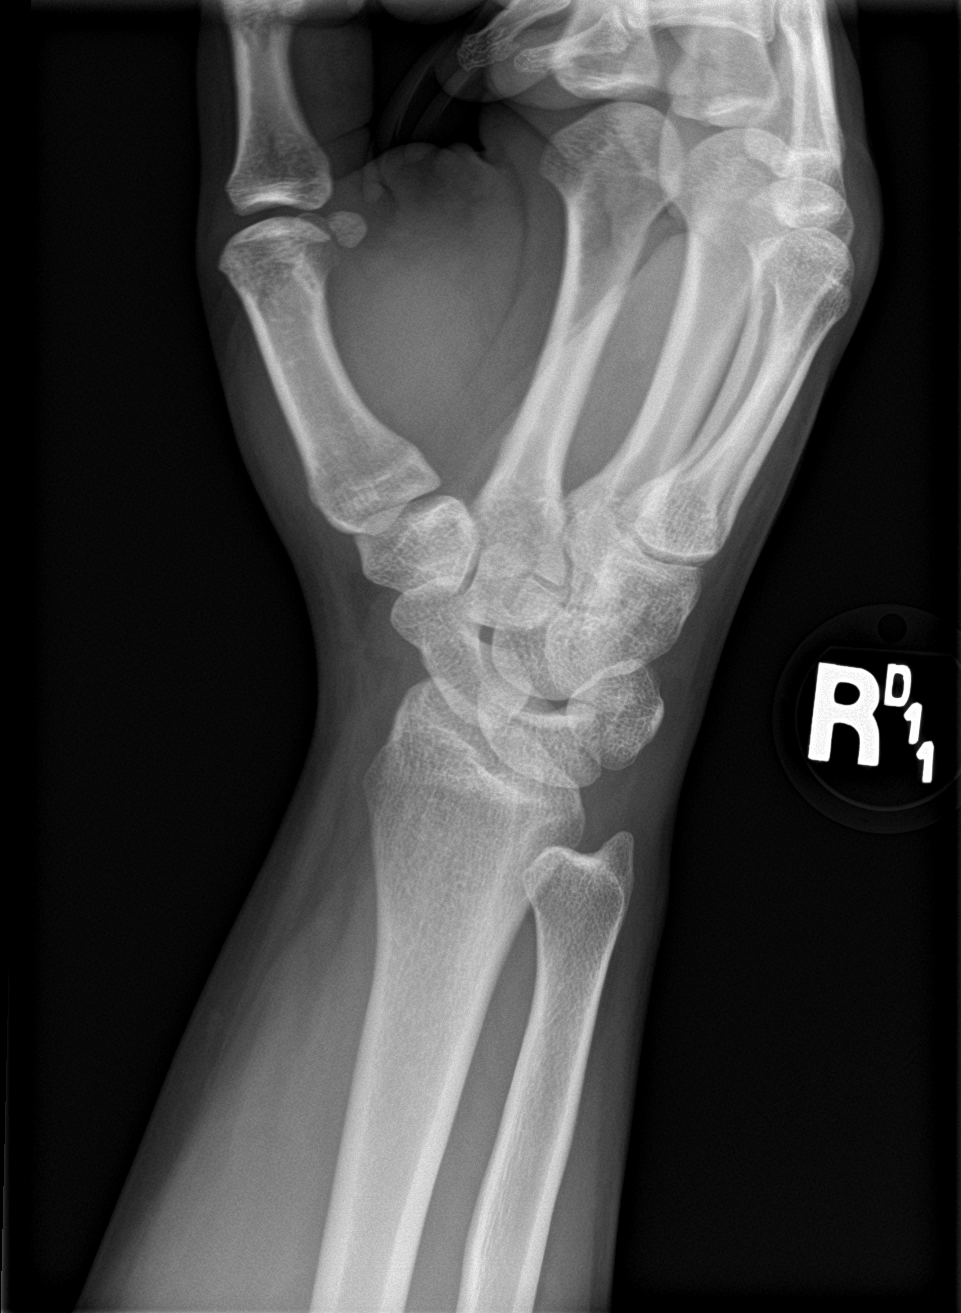

[wrist lat]
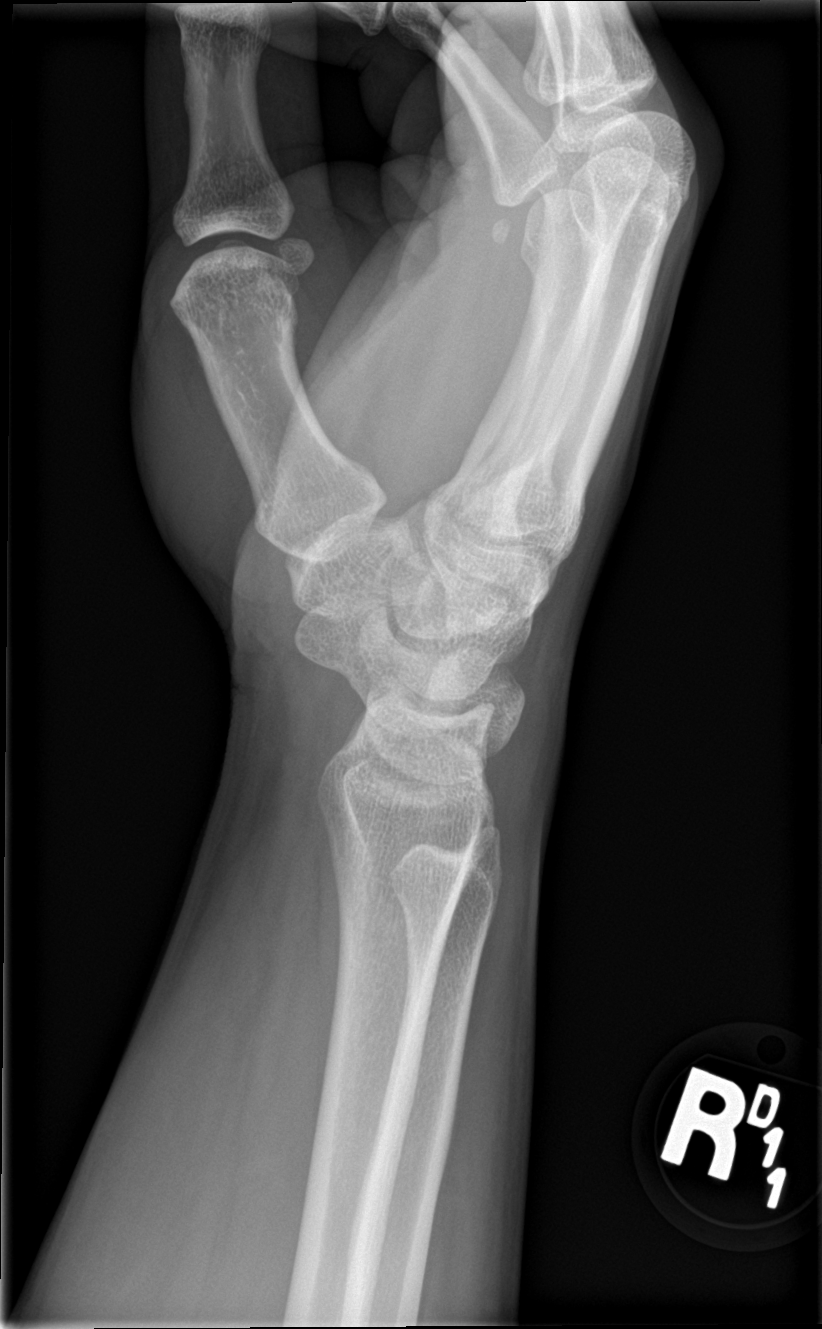

[wrist navicular]
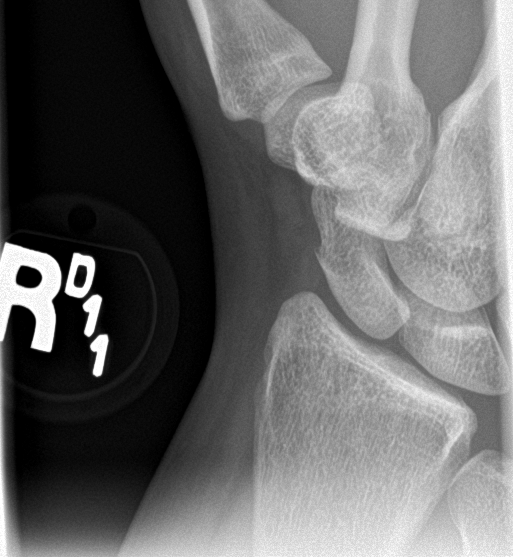

[4 of 4 positions shown; findings below may reference images not displayed]

FINDINGS: There is no evidence of acute fracture or dislocation. There is
negative ulnar variance. No significant arthritis.
IMPRESSION: No acute osseous abnormality.

## 2022-09-27 ENCOUNTER — Emergency Department (HOSPITAL_BASED_OUTPATIENT_CLINIC_OR_DEPARTMENT_OTHER)
Admission: EM | Admit: 2022-09-27 | Discharge: 2022-09-27 | Disposition: A | Discharge status: Home / Self Care | Payer: Self-pay | Attending: Emergency Medicine | Admitting: Emergency Medicine

## 2022-09-27 ENCOUNTER — Encounter (HOSPITAL_BASED_OUTPATIENT_CLINIC_OR_DEPARTMENT_OTHER): Payer: Self-pay

## 2022-09-27 ENCOUNTER — Other Ambulatory Visit: Payer: Self-pay

## 2022-09-27 ENCOUNTER — Emergency Department (HOSPITAL_BASED_OUTPATIENT_CLINIC_OR_DEPARTMENT_OTHER): Payer: Self-pay

## 2022-09-27 DIAGNOSIS — R519 Headache, unspecified: Secondary | ICD-10-CM | POA: Insufficient documentation

## 2022-09-27 DIAGNOSIS — M542 Cervicalgia: Secondary | ICD-10-CM | POA: Insufficient documentation

## 2022-09-27 MED ORDER — METHOCARBAMOL 500 MG PO TABS: 500.0000 mg | ORAL_TABLET | Freq: Three times a day (TID) | ORAL | 0 refills | Status: AC | PRN

## 2022-09-27 MED ORDER — PREDNISONE 50 MG PO TABS
60.0000 mg | ORAL_TABLET | Freq: Once | ORAL | Status: AC
  Administered 2022-09-27: 60 mg via ORAL
  Filled 2022-09-27: qty 1

## 2022-09-27 MED ORDER — PREDNISONE 20 MG PO TABS: 40.0000 mg | ORAL_TABLET | Freq: Every day | ORAL | 0 refills | Status: AC

## 2022-09-27 MED ORDER — HYDROMORPHONE HCL 1 MG/ML IJ SOLN
1.0000 mg | Freq: Once | INTRAMUSCULAR | Status: AC
  Administered 2022-09-27: 1 mg via INTRAMUSCULAR
  Filled 2022-09-27: qty 1

## 2022-09-27 NOTE — ED Triage Notes (Signed)
Pt complaining of a headache that started earlier today, fell 4 days ago and hit the back of her head. Today it is hurting on the left side of her neck and radiates up the back of her head

## 2022-09-27 NOTE — Discharge Instructions (Signed)
You were evaluated in the Emergency Department and after careful evaluation, we did not find any emergent condition requiring admission or further testing in the hospital.  Your exam/testing today is overall reassuring.  Symptoms may be due to occipital neuralgia or inflammation of the nerve at the back of your head.  Take the prednisone medication as directed.  Continue Tylenol 1000 mg every 4-6 hours.  Can use the Robaxin muscle relaxer for more significant pain.  If not improving recommend follow-up with the spine experts.  Please return to the Emergency Department if you experience any worsening of your condition.   Thank you for allowing [place] to be a part of your care.

## 2022-09-27 NOTE — ED Provider Notes (Signed)
[facility]-EMERGENCY DEPT [facility] Emergency Department Provider Note MRN:  [id number]  Arrival date & time: 09/27/22     Chief Complaint   Headache   History of Present Illness   Karen Warren is a 36 y.o. year-old female with no pertinent past medical history presenting to the ED with chief complaint of headache.  Patient fell and hit the back of her head 4 days ago, since then is having significant headache and pain to the neck.  No numbness or weakness to the arms or legs.  Review of Systems  A thorough review of systems was obtained and all systems are negative except as noted in the HPI and PMH.   Patient's Health History    Past Medical History:  Diagnosis Date   Renal disorder    kidney infection    Past Surgical History:  Procedure Laterality Date   CESAREAN SECTION     CHOLECYSTECTOMY     TUBAL LIGATION     WISDOM TOOTH EXTRACTION      Family History  Problem Relation Age of Onset   Hypertension Mother    Hypertension Maternal Grandmother    Diabetes Maternal Grandmother     Social History   Socioeconomic History   Marital status: Married    Spouse name: Not on file   Number of children: Not on file   Years of education: Not on file   Highest education level: Not on file  Occupational History   Not on file  Tobacco Use   Smoking status: Every Day    Packs/day: 0.30    Types: Cigarettes   Smokeless tobacco: Never  Vaping Use   Vaping Use: Never used  Substance and Sexual Activity   Alcohol use: Yes   Drug use: No   Sexual activity: Yes    Birth control/protection: Surgical  Other Topics Concern   Not on file  Social History Narrative   Not on file   Social Determinants of Health   Financial Resource Strain: Not on file  Food Insecurity: Not on file  Transportation Needs: Not on file  Physical Activity: Not on file  Stress: Not on file  Social Connections: Not on file  Intimate Partner Violence: Not on file     Physical Exam    Vitals:   09/27/22 0516 09/27/22 0530  BP: (!) 147/91 (!) 150/118  Pulse: 86 91  Resp: 18 18  Temp: 98.2 F (36.8 C)   SpO2: 100% 100%    CONSTITUTIONAL: Well-appearing, NAD NEURO/PSYCH:  Alert and oriented x 3, no focal deficits EYES:  eyes equal and reactive ENT/NECK:  no LAD, no JVD CARDIO: Regular rate, well-perfused, normal S1 and S2 PULM:  CTAB no wheezing or rhonchi GI/GU:  non-distended, non-tender MSK/SPINE:  No gross deformities, no edema SKIN:  no rash, atraumatic   *Additional and/or pertinent findings included in MDM below  Diagnostic and Interventional Summary    EKG Interpretation  Date/Time:    Ventricular Rate:    PR Interval:    QRS Duration:   QT Interval:    QTC Calculation:   R Axis:     Text Interpretation:         Labs Reviewed - No data to display  CT HEAD WO CONTRAST ( )  Final Result    CT CERVICAL SPINE WO CONTRAST  Final Result      Medications  HYDROmorphone (DILAUDID) injection 1 mg (1 mg Intramuscular Given 09/27/22 0539)  predniSONE (DELTASONE) tablet 60 mg (60  mg Oral Given 09/27/22 3329)     Procedures  /  Critical Care Procedures  ED Course and Medical Decision Making  Initial Impression and Ddx Differential diagnosis includes subdural hematoma, cervical spinal fracture.  On exam the pain distribution is suspicious for occipital neuralgia.  Could also be a cervicogenic headache due to muscle strain.  No signs or symptoms of myelopathy.  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  Patient's pain is on the left and so this is favored to be incidental.  Patient Reassessment and Ultimate Disposition/Management     Patient feeling better, will provide steroids to try to treat this occipital neuralgia.  Given the findings on CT patient is  encouraged to follow-up with spinal expert if not improving.  Patient management required discussion with the following services or consulting groups:  None  Complexity of Problems Addressed Acute illness or injury that poses threat of life of bodily function  Additional Data Reviewed and Analyzed Further history obtained from: Further history from spouse/family member  Additional Factors Impacting ED Encounter Risk Prescriptions  Elmer Sow. Pilar Plate, MD [facility] Emergency Medicine [facility] [email or web address]  Final Clinical Impressions(s) / ED Diagnoses     ICD-10-CM   1. Acute nonintractable headache, unspecified headache type  R51.9     2. Neck pain  M54.2       ED Discharge Orders          Ordered    predniSONE (DELTASONE) 20 MG tablet  Daily        09/27/22 0635    methocarbamol (ROBAXIN) 500 MG tablet  Every 8 hours PRN        09/27/22 5188             Discharge Instructions Discussed with and Provided to Patient:    Discharge Instructions      You were evaluated in the Emergency Department and after careful evaluation, we did not find any emergent condition requiring admission or further testing in the hospital.  Your exam/testing today is overall reassuring.  Symptoms may be due to occipital neuralgia or inflammation of the nerve at the back of your head.  Take the prednisone medication as directed.  Continue Tylenol 1000 mg every 4-6 hours.  Can use the Robaxin muscle relaxer for more significant pain.  If not improving recommend follow-up with the spine experts.  Please return to the Emergency Department if you experience any worsening of your condition.   Thank you for allowing [place] to be a part of your care.      Sabas Sous, MD 09/27/22 [phone]

## 2022-10-20 ENCOUNTER — Other Ambulatory Visit: Payer: Self-pay

## 2022-10-20 ENCOUNTER — Emergency Department (HOSPITAL_BASED_OUTPATIENT_CLINIC_OR_DEPARTMENT_OTHER): Payer: Self-pay

## 2022-10-20 ENCOUNTER — Emergency Department (HOSPITAL_BASED_OUTPATIENT_CLINIC_OR_DEPARTMENT_OTHER)
Admission: EM | Admit: 2022-10-20 | Discharge: 2022-10-20 | Disposition: A | Discharge status: Home / Self Care | Payer: Self-pay | Attending: Emergency Medicine | Admitting: Emergency Medicine

## 2022-10-20 ENCOUNTER — Encounter (HOSPITAL_BASED_OUTPATIENT_CLINIC_OR_DEPARTMENT_OTHER): Payer: Self-pay

## 2022-10-20 DIAGNOSIS — M5020 Other cervical disc displacement, unspecified cervical region: Secondary | ICD-10-CM

## 2022-10-20 DIAGNOSIS — M542 Cervicalgia: Secondary | ICD-10-CM | POA: Insufficient documentation

## 2022-10-20 DIAGNOSIS — R519 Headache, unspecified: Secondary | ICD-10-CM | POA: Insufficient documentation

## 2022-10-20 DIAGNOSIS — W108XXA Fall (on) (from) other stairs and steps, initial encounter: Secondary | ICD-10-CM | POA: Insufficient documentation

## 2022-10-20 LAB — CBC WITH DIFFERENTIAL/PLATELET
Abs Immature Granulocytes: 0.01 10*3/uL (ref 0.00–0.07)
Basophils Absolute: 0 10*3/uL (ref 0.0–0.1)
Basophils Relative: 0 %
Eosinophils Absolute: 0.1 10*3/uL (ref 0.0–0.5)
Eosinophils Relative: 1 %
HCT: 33.3 % — ABNORMAL LOW (ref 36.0–46.0)
Hemoglobin: 11.2 g/dL — ABNORMAL LOW (ref 12.0–15.0)
Immature Granulocytes: 0 %
Lymphocytes Relative: 41 %
Lymphs Abs: 2.4 10*3/uL (ref 0.7–4.0)
MCH: 29.1 pg (ref 26.0–34.0)
MCHC: 33.6 g/dL (ref 30.0–36.0)
MCV: 86.5 fL (ref 80.0–100.0)
Monocytes Absolute: 0.4 10*3/uL (ref 0.1–1.0)
Monocytes Relative: 7 %
Neutro Abs: 2.9 10*3/uL (ref 1.7–7.7)
Neutrophils Relative %: 51 %
Platelets: 308 10*3/uL (ref 150–400)
RBC: 3.85 MIL/uL — ABNORMAL LOW (ref 3.87–5.11)
RDW: 14.6 % (ref 11.5–15.5)
WBC: 5.7 10*3/uL (ref 4.0–10.5)
nRBC: 0 % (ref 0.0–0.2)

## 2022-10-20 LAB — BASIC METABOLIC PANEL
Anion gap: 6 (ref 5–15)
BUN: 12 mg/dL (ref 6–20)
CO2: 20 mmol/L — ABNORMAL LOW (ref 22–32)
Calcium: 8.2 mg/dL — ABNORMAL LOW (ref 8.9–10.3)
Chloride: 107 mmol/L (ref 98–111)
Creatinine, Ser: 0.78 mg/dL (ref 0.44–1.00)
GFR, Estimated: >60 mL/min (ref >60)
Glucose, Bld: 88 mg/dL (ref 70–99)
Potassium: 4 mmol/L (ref 3.5–5.1)
Sodium: 133 mmol/L — ABNORMAL LOW (ref 135–145)

## 2022-10-20 LAB — HCG, SERUM, QUALITATIVE: Preg, Serum: NEGATIVE

## 2022-10-20 MED ORDER — MORPHINE SULFATE (PF) 4 MG/ML IV SOLN
4.0000 mg | Freq: Once | INTRAVENOUS | Status: AC
  Administered 2022-10-20: 4 mg via INTRAVENOUS
  Filled 2022-10-20: qty 1

## 2022-10-20 MED ORDER — SODIUM CHLORIDE 0.9 % IV BOLUS
1000.0000 mL | Freq: Once | INTRAVENOUS | Status: AC
  Administered 2022-10-20: 1000 mL via INTRAVENOUS

## 2022-10-20 MED ORDER — PREDNISONE 10 MG PO TABS: 20.0000 mg | ORAL_TABLET | Freq: Two times a day (BID) | ORAL | 0 refills | Status: AC

## 2022-10-20 MED ORDER — HYDROCODONE-ACETAMINOPHEN 5-325 MG PO TABS: 1.0000 | ORAL_TABLET | Freq: Four times a day (QID) | ORAL | 0 refills | Status: AC | PRN

## 2022-10-20 MED ORDER — KETOROLAC TROMETHAMINE 30 MG/ML IJ SOLN
30.0000 mg | Freq: Once | INTRAMUSCULAR | Status: AC
  Administered 2022-10-20: 30 mg via INTRAVENOUS
  Filled 2022-10-20: qty 1

## 2022-10-20 NOTE — Discharge Instructions (Addendum)
Begin taking prednisone as prescribed.  Begin taking hydrocodone as prescribed as needed for pain.  Follow-up with neurosurgery in the next week if not improving.  The contact information for [place] [facility] has been provided in this discharge summary for you to call and make these arrangements.

## 2022-10-20 NOTE — ED Triage Notes (Signed)
Pt reports posterior headache that has progressed since falling down stairs last month. She was seen here and told she had a pinched nerve and told to come back to the ER if she does not feel better. Todays episode started at 1pm today. Pain unrelieved with multiple OTC meds. Pt A&Ox4, speaking clear complete sentences. Denies numbness or tingling anywhere but she states she feels weak.

## 2022-10-20 NOTE — ED Provider Notes (Signed)
[facility] EMERGENCY DEPARTMENT Provider Note   CSN: [id number] Arrival date & time: 10/20/22  0016     History  Chief Complaint  Patient presents with   Headache    Karen Warren is a 37 y.o. female.  Patient is a 37 year old female with past medical history of acute pyelonephritis, anemia.  Patient presenting today with complaints of severe neck and head pain.  She reports falling down stairs approximately 2-1/2 weeks ago.  She was evaluated here and had a CT scan of her head and neck.  She was told she had a disc herniated with a pinched nerve.  She was treated with prednisone and a muscle relaxer.  Symptoms initially improved, however over the past week have significantly worsened.  She "feels like I am dying".  She denies any weakness or numbness of the extremities.  She denies any visual disturbances.  The history is provided by the patient.       Home Medications Prior to Admission medications   Medication Sig Start Date End Date Taking? Authorizing Provider  diclofenac Sodium (VOLTAREN) 1 % GEL Apply 2 g topically 4 (four) times daily. 05/20/22   Kommor, [place], MD  fluticasone (FLONASE) 50 MCG/ACT nasal spray Place 1 spray into both nostrils daily. Patient not taking: Reported on 05/19/2022 11/29/16   Audry Pili, PA-C  methocarbamol (ROBAXIN) 500 MG tablet Take 1 tablet (500 mg total) by mouth every 8 (eight) hours as needed for muscle spasms. 09/27/22   Sabas Sous, MD  oxyCODONE (ROXICODONE) 5 MG immediate release tablet Take 1 tablet (5 mg total) by mouth every 6 (six) hours as needed for breakthrough pain. 05/20/22   Kommor, Wyn Forster, MD      Allergies    Patient has no known allergies.    Review of Systems   Review of Systems  All other systems reviewed and are negative.   Physical Exam Updated Vital Signs BP (!) 130/98 (BP Location: Right Arm)   Pulse 81   Temp 98.8 F (37.1 C) (Oral)   Resp 18   Ht 5\' 2"  (1.575 m)   Wt 81.6 kg   LMP  09/27/2022   SpO2 100%   BMI 32.92 kg/m  Physical Exam Vitals and nursing note reviewed.  Constitutional:      General: She is not in acute distress.    Appearance: She is well-developed. She is not diaphoretic.  HENT:     Head: Normocephalic and atraumatic.  Neck:     Comments: There is tenderness to palpation of the soft tissues of the left upper cervical region and occipital region. Cardiovascular:     Rate and Rhythm: Normal rate and regular rhythm.     Heart sounds: No murmur heard.    No friction rub. No gallop.  Pulmonary:     Effort: Pulmonary effort is normal. No respiratory distress.     Breath sounds: Normal breath sounds. No wheezing.  Abdominal:     General: Bowel sounds are normal. There is no distension.     Palpations: Abdomen is soft.     Tenderness: There is no abdominal tenderness.  Musculoskeletal:        General: Normal range of motion.     Cervical back: Normal range of motion and neck supple.  Skin:    General: Skin is warm and dry.  Neurological:     General: No focal deficit present.     Mental Status: She is alert and oriented to person, place,  and time.     ED Results / Procedures / Treatments   Labs (all labs ordered are listed, but only abnormal results are displayed) Labs Reviewed  BASIC METABOLIC PANEL  CBC WITH DIFFERENTIAL/PLATELET  HCG, SERUM, QUALITATIVE    EKG None  Radiology No results found.  Procedures Procedures    Medications Ordered in ED Medications  sodium chloride 0.9 % bolus 1,000 mL (has no administration in time range)  ketorolac (TORADOL) 30 MG/ML injection 30 mg (has no administration in time range)  morphine (PF) 4 MG/ML injection 4 mg (has no administration in time range)    ED Course/ Medical Decision Making/ A&P  Patient is a 37 year old female presenting with complaints of head and neck pain.  She reports a fall 2 weeks ago and undergoing imaging studies revealing a paracentral disc herniation.  She  was treated with steroids and seem to improve.  Pain became worse over the past week.  She is experiencing no weakness or numbness.  Patient arrives here afebrile with stable vital signs.  There is exquisite tenderness to the soft tissues of the left posterior upper neck and occipital region.  She is otherwise neurologically intact.  Workup initiated including CBC, metabolic panel, both of which were unremarkable.  Due to the level of her discomfort, imaging of her head and neck was repeated including CT scan of the cervical spine and brain.  These were both negative with the exception of the paracentral disc protrusion.  I am uncertain as to whether or not this is the actual cause of her discomfort, but will refer the patient to neurosurgery.  She was given IV fluids along with pain medication and is now feeling markedly improved.  She will be discharged with another course of steroids and pain medication.  Final Clinical Impression(s) / ED Diagnoses Final diagnoses:  None    Rx / DC Orders ED Discharge Orders     None         Veryl Speak, MD 10/20/22 [phone]

## 2022-10-20 NOTE — ED Notes (Signed)
Patient transported to CT 

## 2022-10-20 NOTE — ED Notes (Signed)
Pt called out requesting more pain medication. Dr. Stark Jock informed.

## 2023-10-27 ENCOUNTER — Emergency Department (HOSPITAL_BASED_OUTPATIENT_CLINIC_OR_DEPARTMENT_OTHER): Payer: Self-pay

## 2023-10-27 ENCOUNTER — Emergency Department (HOSPITAL_BASED_OUTPATIENT_CLINIC_OR_DEPARTMENT_OTHER)
Admission: EM | Admit: 2023-10-27 | Discharge: 2023-10-27 | Disposition: A | Discharge status: Home / Self Care | Payer: Self-pay | Attending: Emergency Medicine | Admitting: Emergency Medicine

## 2023-10-27 ENCOUNTER — Encounter (HOSPITAL_BASED_OUTPATIENT_CLINIC_OR_DEPARTMENT_OTHER): Payer: Self-pay | Admitting: Emergency Medicine

## 2023-10-27 ENCOUNTER — Other Ambulatory Visit: Payer: Self-pay

## 2023-10-27 ENCOUNTER — Other Ambulatory Visit (HOSPITAL_BASED_OUTPATIENT_CLINIC_OR_DEPARTMENT_OTHER): Payer: Self-pay

## 2023-10-27 DIAGNOSIS — N83201 Unspecified ovarian cyst, right side: Secondary | ICD-10-CM | POA: Insufficient documentation

## 2023-10-27 LAB — CBC WITH DIFFERENTIAL/PLATELET
Abs Immature Granulocytes: 0.02 10*3/uL (ref 0.00–0.07)
Basophils Absolute: 0 10*3/uL (ref 0.0–0.1)
Basophils Relative: 0 %
Eosinophils Absolute: 0.1 10*3/uL (ref 0.0–0.5)
Eosinophils Relative: 1 %
HCT: 38.5 % (ref 36.0–46.0)
Hemoglobin: 12.8 g/dL (ref 12.0–15.0)
Immature Granulocytes: 0 %
Lymphocytes Relative: 30 %
Lymphs Abs: 3.1 10*3/uL (ref 0.7–4.0)
MCH: 29 pg (ref 26.0–34.0)
MCHC: 33.2 g/dL (ref 30.0–36.0)
MCV: 87.1 fL (ref 80.0–100.0)
Monocytes Absolute: 0.4 10*3/uL (ref 0.1–1.0)
Monocytes Relative: 4 %
Neutro Abs: 6.5 10*3/uL (ref 1.7–7.7)
Neutrophils Relative %: 65 %
Platelets: 338 10*3/uL (ref 150–400)
RBC: 4.42 MIL/uL (ref 3.87–5.11)
RDW: 13.3 % (ref 11.5–15.5)
WBC: 10.1 10*3/uL (ref 4.0–10.5)
nRBC: 0 % (ref 0.0–0.2)

## 2023-10-27 LAB — URINALYSIS, ROUTINE W REFLEX MICROSCOPIC
Bilirubin Urine: NEGATIVE
Glucose, UA: NEGATIVE mg/dL
Ketones, ur: NEGATIVE mg/dL
Leukocytes,Ua: NEGATIVE
Nitrite: NEGATIVE
Protein, ur: NEGATIVE mg/dL
Specific Gravity, Urine: 1.02 (ref 1.005–1.030)
pH: 7 (ref 5.0–8.0)

## 2023-10-27 LAB — BASIC METABOLIC PANEL
Anion gap: 8 (ref 5–15)
BUN: 11 mg/dL (ref 6–20)
CO2: 22 mmol/L (ref 22–32)
Calcium: 8.9 mg/dL (ref 8.9–10.3)
Chloride: 105 mmol/L (ref 98–111)
Creatinine, Ser: 0.77 mg/dL (ref 0.44–1.00)
GFR, Estimated: >60 mL/min (ref >60)
Glucose, Bld: 94 mg/dL (ref 70–99)
Potassium: 3.7 mmol/L (ref 3.5–5.1)
Sodium: 135 mmol/L (ref 135–145)

## 2023-10-27 LAB — URINALYSIS, MICROSCOPIC (REFLEX)

## 2023-10-27 LAB — PREGNANCY, URINE: Preg Test, Ur: NEGATIVE

## 2023-10-27 MED ORDER — FENTANYL CITRATE PF 50 MCG/ML IJ SOSY
50.0000 ug | PREFILLED_SYRINGE | Freq: Once | INTRAMUSCULAR | Status: AC
  Administered 2023-10-27: 50 ug via INTRAVENOUS
  Filled 2023-10-27: qty 1

## 2023-10-27 MED ORDER — KETOROLAC TROMETHAMINE 15 MG/ML IJ SOLN
15.0000 mg | Freq: Once | INTRAMUSCULAR | Status: AC
  Administered 2023-10-27: 15 mg via INTRAVENOUS
  Filled 2023-10-27: qty 1

## 2023-10-27 MED ORDER — OXYCODONE HCL 5 MG PO TABS
5.0000 mg | ORAL_TABLET | Freq: Four times a day (QID) | ORAL | 0 refills | Status: AC | PRN
  Filled 2023-10-27: qty 10, 3d supply, fill #0

## 2023-10-27 MED ORDER — ONDANSETRON 4 MG PO TBDP
4.0000 mg | ORAL_TABLET | Freq: Three times a day (TID) | ORAL | 0 refills | Status: DC | PRN
  Filled 2023-10-27: qty 7, 3d supply, fill #0

## 2023-10-27 MED ORDER — IOHEXOL 300 MG/ML  SOLN
100.0000 mL | Freq: Once | INTRAMUSCULAR | Status: AC | PRN
  Administered 2023-10-27: 100 mL via INTRAVENOUS

## 2023-10-27 MED ORDER — ONDANSETRON HCL 4 MG/2ML IJ SOLN
4.0000 mg | Freq: Once | INTRAMUSCULAR | Status: AC
  Administered 2023-10-27: 4 mg via INTRAVENOUS
  Filled 2023-10-27: qty 2

## 2023-10-27 NOTE — ED Provider Notes (Signed)
 [facility] Provider Note   CSN: [id number] Arrival date & time: 10/27/23  0426     History  Chief Complaint  Patient presents with   Abdominal Pain   Emesis    Karen Warren is a 38 y.o. female.  Patient here with right-sided abdominal pain nothing makes worse or better.  History of gallbladder removal, tubal ligation.  Denies any fever or chills.  Has felt nauseous with this.  She has not had any vaginal bleeding or discharge.  Denies any chest pain shortness of breath cough or sputum production.  The history is provided by the patient.       Home Medications Prior to Admission medications   Medication Sig Start Date End Date Taking? Authorizing Provider  ondansetron  (ZOFRAN -ODT) 4 MG disintegrating tablet Take 1 tablet (4 mg total) by mouth every 8 (eight) hours as needed for nausea or vomiting. 10/27/23  Yes Lenice Koper, DO  oxyCODONE  (ROXICODONE ) 5 MG immediate release tablet Take 1 tablet (5 mg total) by mouth every 6 (six) hours as needed for up to 10 doses. 10/27/23  Yes Miriah Maruyama, DO  diclofenac  Sodium (VOLTAREN ) 1 % GEL Apply 2 g topically 4 (four) times daily. 05/20/22   Kommor, Madison, MD  fluticasone  (FLONASE ) 50 MCG/ACT nasal spray Place 1 spray into both nostrils daily. Patient not taking: Reported on 05/19/2022 11/29/16   Olympia Gee, PA-C  HYDROcodone -acetaminophen  (NORCO) 5-325 MG tablet Take 1-2 tablets by mouth every 6 (six) hours as needed. 10/20/22   Geroldine Berg, MD  methocarbamol  (ROBAXIN ) 500 MG tablet Take 1 tablet (500 mg total) by mouth every 8 (eight) hours as needed for muscle spasms. 09/27/22   Theadore Ozell CHRISTELLA, MD  predniSONE  (DELTASONE ) 10 MG tablet Take 2 tablets (20 mg total) by mouth 2 (two) times daily. 10/20/22   Geroldine Berg, MD      Allergies    Patient has no known allergies.    Review of Systems   Review of Systems  Physical Exam Updated Vital Signs BP (!) 127/98 (BP Location: Left  Arm)   Pulse 73   Temp 98.5 F (36.9 C) (Oral)   Resp 18   Ht 5' 4 (1.626 m)   Wt 86.2 kg   LMP 10/13/2023 (Approximate)   SpO2 100%   BMI 32.61 kg/m  Physical Exam Vitals and nursing note reviewed.  Constitutional:      General: She is not in acute distress.    Appearance: She is well-developed.  HENT:     Head: Normocephalic and atraumatic.  Eyes:     Conjunctiva/sclera: Conjunctivae normal.  Cardiovascular:     Rate and Rhythm: Normal rate and regular rhythm.     Heart sounds: Normal heart sounds. No murmur heard. Pulmonary:     Effort: Pulmonary effort is normal. No respiratory distress.     Breath sounds: Normal breath sounds.  Abdominal:     Palpations: Abdomen is soft.     Tenderness: There is abdominal tenderness in the right lower quadrant. There is right CVA tenderness.  Musculoskeletal:        General: No swelling.     Cervical back: Neck supple.  Skin:    General: Skin is warm and dry.     Capillary Refill: Capillary refill takes less than 2 seconds.  Neurological:     Mental Status: She is alert.  Psychiatric:        Mood and Affect: Mood normal.  ED Results / Procedures / Treatments   Labs (all labs ordered are listed, but only abnormal results are displayed) Labs Reviewed  URINALYSIS, ROUTINE W REFLEX MICROSCOPIC - Abnormal; Notable for the following components:      Result Value   APPearance HAZY (*)    Hgb urine dipstick TRACE (*)    All other components within normal limits  URINALYSIS, MICROSCOPIC (REFLEX) - Abnormal; Notable for the following components:   Bacteria, UA RARE (*)    All other components within normal limits  CBC WITH DIFFERENTIAL/PLATELET  BASIC METABOLIC PANEL  PREGNANCY, URINE    EKG None  Radiology US  PELVIC COMPLETE W TRANSVAGINAL AND TORSION R/O Result Date: 10/27/2023 CLINICAL DATA:  Right lower quadrant abdominal pain. EXAM: TRANSABDOMINAL AND TRANSVAGINAL ULTRASOUND OF PELVIS DOPPLER ULTRASOUND OF OVARIES  TECHNIQUE: Both transabdominal and transvaginal ultrasound examinations of the pelvis were performed. Transabdominal technique was performed for global imaging of the pelvis including uterus, ovaries, adnexal regions, and pelvic cul-de-sac. It was necessary to proceed with endovaginal exam following the transabdominal exam to visualize the uterus, endometrium, bilateral ovaries and adnexa. Color and duplex Doppler ultrasound was utilized to evaluate blood flow to the ovaries. COMPARISON:  CT scan abdomen and pelvis from earlier the same day. FINDINGS: Uterus Measurements: 5.1 x 6.7 x 11.7 cm = volume: 213.8 mL. No fibroids or other mass visualized. Endometrium Thickness: 16.2 mm.  No focal abnormality visualized. Right ovary Measurements: 3.2 x 3.4 x 5.1 cm = volume: 28.6 mL. Normal appearance/no adnexal mass. There is a vague hyperechoic area within the right ovary, which may corresponds to hyperattenuating areas seen on the prior CT scan and favored to represent a hemorrhagic corpus luteal cyst. There is small amount of fluid surrounding the right ovary, better seen on the CT scan. Left ovary Measurements: 2.7 x 2.8 x 3.6 cm = volume: 14.1 mL. Normal appearance/no adnexal mass. Pulsed Doppler evaluation of both ovaries demonstrates normal low-resistance arterial and venous waveforms. While demonstrable color flow and spectral Doppler in an ovary cannot completely exclude ovarian torsion, in combination with a normal grayscale appearance of the ovary, this makes torsion highly unlikely. Other findings Small amount of free fluid in the cul de sac, better seen on the CT scan from earlier the same day. IMPRESSION: *There is a probable hemorrhagic corpus luteal cyst in the right ovary and small amount of fluid surrounding it, better seen on the CT scan. Findings favor mittelschmerz disease. Correlate clinically. *Otherwise unremarkable exam. Electronically Signed   By: Ree Molt M.D.   On: 10/27/2023 10:51   CT  ABDOMEN PELVIS W CONTRAST Result Date: 10/27/2023 CLINICAL DATA:  Abdominal pain, acute, worse on the right with associated back pain for 1 week. Intermittent nausea and vomiting. EXAM: CT ABDOMEN AND PELVIS WITH CONTRAST TECHNIQUE: Multidetector CT imaging of the abdomen and pelvis was performed using the standard protocol following bolus administration of intravenous contrast. RADIATION DOSE REDUCTION: This exam was performed according to the departmental dose-optimization program which includes automated exposure control, adjustment of the mA and/or kV according to patient size and/or use of iterative reconstruction technique. CONTRAST:  OMNIPAQUE  IOHEXOL  300 MG/ML  SOLN COMPARISON:  05/20/2022 FINDINGS: Lower chest: No acute abnormality. Hepatobiliary: No focal liver abnormality is seen. Status post cholecystectomy. No biliary dilatation. Pancreas: Unremarkable. No pancreatic ductal dilatation or surrounding inflammatory changes. Spleen: Normal in size without focal abnormality. Adrenals/Urinary Tract: Adrenal glands are unremarkable. Kidneys are normal, without renal calculi, focal lesion, or hydronephrosis. Bladder is  unremarkable. Stomach/Bowel: Stomach is within normal limits. Appendix appears normal. No evidence of bowel wall thickening, distention, or inflammatory changes. Vascular/Lymphatic: Nonocclusive infrarenal mild aortic atherosclerosis. No acute vascular finding. Mesenteric and renal vasculature all appear patent no aortoiliac occlusive process. No veno-occlusive process either. Bulky adenopathy Reproductive: Prominent uterus with small fundal fibroids noted, 1 measuring 2.1 cm. Left ovary demonstrates a dominant 16 mm follicle. Right ovary demonstrates a small irregular hyperattenuating area favored to be a peripherally enhancing collapsing cyst. There is adjacent right adnexal free fluid extending into the cul-de-sac suggesting that this cyst is ruptured/possibly hemorrhagic. Other: No  abdominal wall hernia or abnormality. Musculoskeletal: No acute or significant osseous findings. IMPRESSION: 1. Right ovarian small irregular hyperattenuating area favored to be a peripherally enhancing collapsing cyst. There is adjacent right adnexal free fluid extending into the cul-de-sac suggesting that this cyst is ruptured/possibly hemorrhagic. 2. Aortic atherosclerosis. Aortic Atherosclerosis (ICD10-I70.0). Electronically Signed   By: CHRISTELLA.  Shick M.D.   On: 10/27/2023 09:23    Procedures Procedures    Medications Ordered in ED Medications  iohexol  (OMNIPAQUE ) 300 MG/ML solution 100 mL (100 mLs Intravenous Contrast Given 10/27/23 0830)  fentaNYL  (SUBLIMAZE ) injection 50 mcg (50 mcg Intravenous Given 10/27/23 0926)  ondansetron  (ZOFRAN ) injection 4 mg (4 mg Intravenous Given 10/27/23 0924)  ketorolac  (TORADOL ) 15 MG/ML injection 15 mg (15 mg Intravenous Given 10/27/23 1025)    ED Course/ Medical Decision Making/ A&P                                 Medical Decision Making Amount and/or Complexity of Data Reviewed Radiology: ordered.  Risk Prescription drug management.   Lela CHRISTELLA Molt is here with right-sided abdominal pain.  History of gallbladder removal and tubal ligation.  History of kidney infections.  Does not have any dysuria.  Overall unremarkable vitals.  No fever.  Tenderness to the right lower abdomen.  Differential diagnosis appendicitis versus less likely bowel obstruction could be ovarian cyst seems less likely to be torsion.  Could be UTI.  Will check CBC CMP lipase urinalysis CT scan abdomen pelvis.  Pregnancy test negative.  No significant leukocytosis or anemia or electrolyte abnormality or kidney injury or UTI per my review interpretation of labs.  CT scan of the abdomen pelvis shows likely a ruptured ovarian cyst but otherwise no acute findings.  Will get pelvic ultrasound to evaluate for torsion and further evaluate for hemorrhagic cyst.  Will give fentanyl  and Zofran   and reevaluate.  Pelvic ultrasound confirms likely hemorrhagic cyst.  No evidence of torsion on ultrasound.  Clinically also think torsion is less likely given that there is this hemorrhagic corpus luteal cyst.  Overall we will treat with Roxicodone  for breakthrough pain recommend Tylenol  ibuprofen  and rest.  Understands return precautions.  Discharged in good condition.  This chart was dictated using voice recognition software.  Despite best efforts to proofread,  errors can occur which can change the documentation meaning.         Final Clinical Impression(s) / ED Diagnoses Final diagnoses:  Cyst of right ovary    Rx / DC Orders ED Discharge Orders          Ordered    oxyCODONE  (ROXICODONE ) 5 MG immediate release tablet  Every 6 hours PRN        10/27/23 1101    ondansetron  (ZOFRAN -ODT) 4 MG disintegrating tablet  Every 8 hours PRN  10/27/23 1101              Ruthe Cornet, DO 10/27/23 1102

## 2023-10-27 NOTE — Discharge Instructions (Addendum)
 Follow-up with your OB/GYN.  Take Roxicodone  for breakthrough pain.  This is a narcotic pain medicine.  Please be careful with this use including do not mix with alcohol drugs or dangerous activities including driving.  Take Tylenol  and ibuprofen  for pain as well.  Return if symptoms worsen.

## 2023-10-27 NOTE — ED Triage Notes (Addendum)
 Patient presents with right sided abd and back pain x 1 week. Patient also reports intermittent n/v. Concerned about possible UTI

## 2024-09-27 ENCOUNTER — Emergency Department (HOSPITAL_BASED_OUTPATIENT_CLINIC_OR_DEPARTMENT_OTHER)
Admission: EM | Admit: 2024-09-27 | Discharge: 2024-09-27 | Disposition: A | Discharge status: Home / Self Care | Payer: Self-pay | Attending: Emergency Medicine | Admitting: Emergency Medicine

## 2024-09-27 ENCOUNTER — Encounter (HOSPITAL_BASED_OUTPATIENT_CLINIC_OR_DEPARTMENT_OTHER): Payer: Self-pay

## 2024-09-27 ENCOUNTER — Other Ambulatory Visit: Payer: Self-pay

## 2024-09-27 DIAGNOSIS — N12 Tubulo-interstitial nephritis, not specified as acute or chronic: Secondary | ICD-10-CM

## 2024-09-27 LAB — URINALYSIS, MICROSCOPIC (REFLEX): WBC, UA: >50 WBC/hpf (ref 0–5)

## 2024-09-27 LAB — URINALYSIS, ROUTINE W REFLEX MICROSCOPIC
Bilirubin Urine: NEGATIVE
Glucose, UA: NEGATIVE mg/dL
Ketones, ur: NEGATIVE mg/dL
Nitrite: NEGATIVE
Protein, ur: NEGATIVE mg/dL
Specific Gravity, Urine: 1.025 (ref 1.005–1.030)
pH: 6.5 (ref 5.0–8.0)

## 2024-09-27 LAB — PREGNANCY, URINE: Preg Test, Ur: NEGATIVE

## 2024-09-27 MED ORDER — OXYCODONE HCL 5 MG PO TABS
5.0000 mg | ORAL_TABLET | Freq: Once | ORAL | Status: AC
  Administered 2024-09-27: 08:00:00 5 mg via ORAL
  Filled 2024-09-27: qty 1

## 2024-09-27 MED ORDER — CEPHALEXIN 500 MG PO CAPS: 500.0000 mg | ORAL_CAPSULE | Freq: Four times a day (QID) | ORAL | 0 refills | Status: AC

## 2024-09-27 MED ORDER — ACETAMINOPHEN 500 MG PO TABS
1000.0000 mg | ORAL_TABLET | Freq: Once | ORAL | Status: AC
  Administered 2024-09-27: 08:00:00 1000 mg via ORAL
  Filled 2024-09-27: qty 2

## 2024-09-27 MED ORDER — KETOROLAC TROMETHAMINE 15 MG/ML IJ SOLN
15.0000 mg | Freq: Once | INTRAMUSCULAR | Status: AC
  Administered 2024-09-27: 08:00:00 15 mg via INTRAMUSCULAR
  Filled 2024-09-27: qty 1

## 2024-09-27 MED ORDER — ONDANSETRON 4 MG PO TBDP: ORAL_TABLET | ORAL | 0 refills | Status: AC

## 2024-09-27 MED ORDER — CEPHALEXIN 250 MG PO CAPS
1000.0000 mg | ORAL_CAPSULE | Freq: Once | ORAL | Status: AC
  Administered 2024-09-27: 08:00:00 1000 mg via ORAL
  Filled 2024-09-27: qty 4

## 2024-09-27 NOTE — ED Triage Notes (Signed)
 Pt complaining of pain in the right flank that started 3 days ago. [place] it hurts when she urinates, not burning but hurting. She said she is urinating more often than normal.

## 2024-09-27 NOTE — ED Provider Notes (Signed)
 [place] [facility] Provider Note   CSN: [id number] Arrival date & time: 09/27/24  9371     Patient presents with: Dysuria   Karen Warren is a 38 y.o. female.   38 yo F with a chief complaints of dysuria increased frequency and right flank pain.  Going on for about 3 days now.  Feeling subjectively unwell but denies fevers denies nausea or vomiting.  Denies cough or congestion.  Has had kidney infections in the past.  Thinks this feels somewhat similar.   Dysuria      Prior to Admission medications  Medication Sig Start Date End Date Taking? Authorizing Provider  cephALEXin  (KEFLEX ) 500 MG capsule Take 1 capsule (500 mg total) by mouth 4 (four) times daily. 09/27/24  Yes Emil Share, DO  ondansetron  (ZOFRAN -ODT) 4 MG disintegrating tablet 4mg  ODT q4 hours prn nausea/vomit 09/27/24  Yes Archimedes Harold, DO  diclofenac  Sodium (VOLTAREN ) 1 % GEL Apply 2 g topically 4 (four) times daily. 05/20/22   Kommor, Madison, MD  fluticasone  (FLONASE ) 50 MCG/ACT nasal spray Place 1 spray into both nostrils daily. Patient not taking: Reported on 05/19/2022 11/29/16   Olympia Gee, PA-C  HYDROcodone -acetaminophen  (NORCO) 5-325 MG tablet Take 1-2 tablets by mouth every 6 (six) hours as needed. 10/20/22   Geroldine Berg, MD  methocarbamol  (ROBAXIN ) 500 MG tablet Take 1 tablet (500 mg total) by mouth every 8 (eight) hours as needed for muscle spasms. 09/27/22   Theadore Ozell CHRISTELLA, MD  oxyCODONE  (ROXICODONE ) 5 MG immediate release tablet Take 1 tablet (5 mg total) by mouth every 6 (six) hours as needed for up to 10 doses. 10/27/23   Curatolo, Adam, DO  predniSONE  (DELTASONE ) 10 MG tablet Take 2 tablets (20 mg total) by mouth 2 (two) times daily. 10/20/22   Geroldine Berg, MD    Allergies: Patient has no known allergies.    Review of Systems  Genitourinary:  Positive for dysuria.    Updated Vital Signs BP 137/80 (BP Location: Right Arm)   Pulse 93   Temp 98.2 F (36.8 C)  (Oral)   Resp 18   Ht 5' 4 (1.626 m)   Wt 81.6 kg   LMP 08/29/2024   SpO2 100%   BMI 30.90 kg/m   Physical Exam Vitals and nursing note reviewed.  Constitutional:      General: She is not in acute distress.    Appearance: She is well-developed. She is not diaphoretic.  HENT:     Head: Normocephalic and atraumatic.  Eyes:     Pupils: Pupils are equal, round, and reactive to light.  Cardiovascular:     Rate and Rhythm: Normal rate and regular rhythm.     Heart sounds: No murmur heard.    No friction rub. No gallop.  Pulmonary:     Effort: Pulmonary effort is normal.     Breath sounds: No wheezing or rales.  Abdominal:     General: There is no distension.     Palpations: Abdomen is soft.     Tenderness: There is no abdominal tenderness. There is right CVA tenderness.  Musculoskeletal:        General: No tenderness.     Cervical back: Normal range of motion and neck supple.  Skin:    General: Skin is warm and dry.  Neurological:     Mental Status: She is alert and oriented to person, place, and time.  Psychiatric:        Behavior:  Behavior normal.     (all labs ordered are listed, but only abnormal results are displayed) Labs Reviewed  URINALYSIS, ROUTINE W REFLEX MICROSCOPIC - Abnormal; Notable for the following components:      Result Value   APPearance HAZY (*)    Hgb urine dipstick TRACE (*)    Leukocytes,Ua TRACE (*)    All other components within normal limits  URINALYSIS, MICROSCOPIC (REFLEX) - Abnormal; Notable for the following components:   Bacteria, UA MANY (*)    All other components within normal limits  PREGNANCY, URINE    EKG: None  Radiology: No results found.   Procedures   Medications Ordered in the ED  cephALEXin  (KEFLEX ) capsule 1,000 mg (has no administration in time range)                                    Medical Decision Making Amount and/or Complexity of Data Reviewed Labs: ordered.  Risk Prescription drug  management.   38 yo F with a chief complaints of dysuria increased frequency right flank pain.  Going on for about 3 days now.  She does have some CVA tenderness on exam.  Awaiting UA.  UA is concerning for urinary tract infection with too numerous to count bacteria greater than 50 white cells.  Patient is well-appearing nontoxic.  Able to tolerate by mouth.  Will start on oral antibiotics.  PCP follow-up.  7:31 AM:  I have discussed the diagnosis/risks/treatment options with the patient.  Evaluation and diagnostic testing in the emergency department does not suggest an emergent condition requiring admission or immediate intervention beyond what has been performed at this time.  They will follow up with PCP. We also discussed returning to the ED immediately if new or worsening sx occur. We discussed the sx which are most concerning (e.g., sudden worsening pain, fever, inability to tolerate by mouth) that necessitate immediate return. Medications administered to the patient during their visit and any new prescriptions provided to the patient are listed below.  Medications given during this visit Medications  cephALEXin  (KEFLEX ) capsule 1,000 mg (has no administration in time range)     The patient appears reasonably screen and/or stabilized for discharge and I doubt any other medical condition or other [facility] requiring further screening, evaluation, or treatment in the ED at this time prior to discharge.       Final diagnoses:  Pyelonephritis    ED Discharge Orders          Ordered    cephALEXin  (KEFLEX ) 500 MG capsule  4 times daily        09/27/24 0730    ondansetron  (ZOFRAN -ODT) 4 MG disintegrating tablet        09/27/24 0730               Emil Share, DO 09/27/24 [id number]

## 2024-09-27 NOTE — Discharge Instructions (Signed)
 Everyone needs a family doctor.  Please try to establish care with one if you do not have 1.  Take the antibiotics as prescribed.  Please return for worsening pain fever inability eat or drink.  Take 4 over the counter ibuprofen  tablets 3 times a day or 2 over-the-counter naproxen  tablets twice a day for pain. Also take tylenol  1000mg (2 extra strength) four times a day.
